# Patient Record
Sex: Female | Born: 1965 | Race: White | Hispanic: No | Smoking: Current every day smoker
Health system: Southern US, Community
[De-identification: ages and names within clinical notes are randomized; demographics above are authoritative.]

## PROBLEM LIST (undated history)

## (undated) DIAGNOSIS — J45909 Unspecified asthma, uncomplicated: Secondary | ICD-10-CM

## (undated) DIAGNOSIS — M797 Fibromyalgia: Secondary | ICD-10-CM

## (undated) DIAGNOSIS — F32A Depression, unspecified: Secondary | ICD-10-CM

## (undated) DIAGNOSIS — J4489 Other specified chronic obstructive pulmonary disease: Secondary | ICD-10-CM

## (undated) DIAGNOSIS — F419 Anxiety disorder, unspecified: Secondary | ICD-10-CM

## (undated) DIAGNOSIS — Q211 Atrial septal defect: Secondary | ICD-10-CM

## (undated) DIAGNOSIS — F17209 Nicotine dependence, unspecified, with unspecified nicotine-induced disorders: Secondary | ICD-10-CM

## (undated) DIAGNOSIS — J449 Chronic obstructive pulmonary disease, unspecified: Secondary | ICD-10-CM

## (undated) DIAGNOSIS — K219 Gastro-esophageal reflux disease without esophagitis: Secondary | ICD-10-CM

## (undated) DIAGNOSIS — F909 Attention-deficit hyperactivity disorder, unspecified type: Secondary | ICD-10-CM

## (undated) DIAGNOSIS — F329 Major depressive disorder, single episode, unspecified: Secondary | ICD-10-CM

## (undated) DIAGNOSIS — I639 Cerebral infarction, unspecified: Secondary | ICD-10-CM

## (undated) DIAGNOSIS — I2510 Atherosclerotic heart disease of native coronary artery without angina pectoris: Principal | ICD-10-CM

## (undated) HISTORY — DX: Anxiety disorder, unspecified: F41.9

## (undated) HISTORY — PX: APPENDECTOMY: SHX54

## (undated) HISTORY — PX: COSMETIC SURGERY: SHX468

## (undated) HISTORY — DX: Atrial septal defect: Q21.1

## (undated) HISTORY — DX: Nicotine dependence, unspecified, with unspecified nicotine-induced disorders: F17.209

## (undated) HISTORY — DX: Major depressive disorder, single episode, unspecified: F32.9

## (undated) HISTORY — DX: Atherosclerotic heart disease of native coronary artery without angina pectoris: I25.10

## (undated) HISTORY — DX: Depression, unspecified: F32.A

## (undated) HISTORY — PX: TONSILLECTOMY: SUR1361

## (undated) HISTORY — PX: ABDOMINAL HYSTERECTOMY: SHX81

## (undated) HISTORY — DX: Fibromyalgia: M79.7

## (undated) HISTORY — DX: Unspecified asthma, uncomplicated: J45.909

## (undated) HISTORY — DX: Attention-deficit hyperactivity disorder, unspecified type: F90.9

## (undated) HISTORY — PX: BRACHIOPLASTY: SUR162

---

## 2001-05-21 HISTORY — PX: GASTRIC BYPASS: SHX52

## 2006-12-02 DIAGNOSIS — I639 Cerebral infarction, unspecified: Secondary | ICD-10-CM

## 2006-12-02 HISTORY — DX: Cerebral infarction, unspecified: I63.9

## 2006-12-02 HISTORY — PX: CAROTID ENDARTERECTOMY: SUR193

## 2008-08-20 ENCOUNTER — Emergency Department (HOSPITAL_COMMUNITY): Admission: EM | Admit: 2008-08-20 | Discharge: 2008-08-20 | Payer: Self-pay | Admitting: Emergency Medicine

## 2008-09-17 ENCOUNTER — Ambulatory Visit: Payer: Self-pay | Admitting: Diagnostic Radiology

## 2008-09-17 ENCOUNTER — Emergency Department (HOSPITAL_BASED_OUTPATIENT_CLINIC_OR_DEPARTMENT_OTHER): Admission: EM | Admit: 2008-09-17 | Discharge: 2008-09-17 | Payer: Self-pay | Admitting: Emergency Medicine

## 2008-09-21 ENCOUNTER — Other Ambulatory Visit: Payer: Self-pay | Admitting: Emergency Medicine

## 2008-09-21 ENCOUNTER — Inpatient Hospital Stay (HOSPITAL_COMMUNITY): Admission: RE | Admit: 2008-09-21 | Discharge: 2008-09-26 | Payer: Self-pay | Admitting: Psychiatry

## 2008-09-21 ENCOUNTER — Ambulatory Visit: Payer: Self-pay | Admitting: Psychiatry

## 2008-11-04 ENCOUNTER — Emergency Department (HOSPITAL_COMMUNITY): Admission: EM | Admit: 2008-11-04 | Discharge: 2008-11-04 | Payer: Self-pay | Admitting: Emergency Medicine

## 2010-04-05 LAB — CBC
Hemoglobin: 11.3 g/dL — ABNORMAL LOW (ref 12.0–15.0)
MCHC: 33.5 g/dL (ref 30.0–36.0)
MCV: 85.9 fL (ref 78.0–100.0)
RDW: 15 % (ref 11.5–15.5)

## 2010-04-05 LAB — DIFFERENTIAL
Basophils Absolute: 0.1 10*3/uL (ref 0.0–0.1)
Basophils Relative: 1 % (ref 0–1)
Eosinophils Absolute: 0.2 10*3/uL (ref 0.0–0.7)
Monocytes Absolute: 0.5 10*3/uL (ref 0.1–1.0)
Monocytes Relative: 9 % (ref 3–12)
Neutro Abs: 2.4 10*3/uL (ref 1.7–7.7)

## 2010-04-05 LAB — BASIC METABOLIC PANEL
CO2: 25 mEq/L (ref 19–32)
Calcium: 9.1 mg/dL (ref 8.4–10.5)
Chloride: 105 mEq/L (ref 96–112)
Creatinine, Ser: 0.67 mg/dL (ref 0.4–1.2)
Glucose, Bld: 87 mg/dL (ref 70–99)
Sodium: 137 mEq/L (ref 135–145)

## 2010-04-05 LAB — POCT CARDIAC MARKERS: CKMB, poc: 3.6 ng/mL (ref 1.0–8.0)

## 2010-04-07 LAB — COMPREHENSIVE METABOLIC PANEL
ALT: 114 U/L — ABNORMAL HIGH (ref 0–35)
AST: 71 U/L — ABNORMAL HIGH (ref 0–37)
CO2: 27 mEq/L (ref 19–32)
Chloride: 104 mEq/L (ref 96–112)
Creatinine, Ser: 0.6 mg/dL (ref 0.4–1.2)
GFR calc Af Amer: >60 mL/min (ref >60)
GFR calc non Af Amer: >60 mL/min (ref >60)
Glucose, Bld: 111 mg/dL — ABNORMAL HIGH (ref 70–99)
Sodium: 141 mEq/L (ref 135–145)
Total Bilirubin: 0.1 mg/dL — ABNORMAL LOW (ref 0.3–1.2)

## 2010-04-07 LAB — CBC
HCT: 35.9 % — ABNORMAL LOW (ref 36.0–46.0)
Hemoglobin: 11.6 g/dL — ABNORMAL LOW (ref 12.0–15.0)
Hemoglobin: 11.9 g/dL — ABNORMAL LOW (ref 12.0–15.0)
MCHC: 33.1 g/dL (ref 30.0–36.0)
MCHC: 33.9 g/dL (ref 30.0–36.0)
MCV: 87.4 fL (ref 78.0–100.0)
MCV: 87.8 fL (ref 78.0–100.0)
RBC: 3.9 MIL/uL (ref 3.87–5.11)
RBC: 4.09 MIL/uL (ref 3.87–5.11)
RDW: 15.4 % (ref 11.5–15.5)
WBC: 6.2 10*3/uL (ref 4.0–10.5)

## 2010-04-07 LAB — DIFFERENTIAL
Basophils Absolute: 0 10*3/uL (ref 0.0–0.1)
Basophils Absolute: 0 10*3/uL (ref 0.0–0.1)
Basophils Relative: 1 % (ref 0–1)
Eosinophils Absolute: 0 10*3/uL (ref 0.0–0.7)
Eosinophils Absolute: 0.1 10*3/uL (ref 0.0–0.7)
Eosinophils Relative: 0 % (ref 0–5)
Eosinophils Relative: 3 % (ref 0–5)
Monocytes Absolute: 0.5 10*3/uL (ref 0.1–1.0)
Monocytes Relative: 11 % (ref 3–12)
Neutro Abs: 2 10*3/uL (ref 1.7–7.7)
Neutrophils Relative %: 76 % (ref 43–77)

## 2010-04-07 LAB — URINALYSIS, ROUTINE W REFLEX MICROSCOPIC
Bilirubin Urine: NEGATIVE
Hgb urine dipstick: NEGATIVE
Ketones, ur: NEGATIVE mg/dL
Nitrite: NEGATIVE
Protein, ur: NEGATIVE mg/dL
Urobilinogen, UA: 0.2 mg/dL (ref 0.0–1.0)

## 2010-04-07 LAB — LIPASE, BLOOD: Lipase: 2403 U/L — ABNORMAL HIGH (ref 23–300)

## 2010-04-07 LAB — BASIC METABOLIC PANEL
CO2: 27 mEq/L (ref 19–32)
Calcium: 8.8 mg/dL (ref 8.4–10.5)
Chloride: 105 mEq/L (ref 96–112)
GFR calc Af Amer: >60 mL/min (ref >60)
Glucose, Bld: 71 mg/dL (ref 70–99)
Potassium: 4.2 mEq/L (ref 3.5–5.1)
Sodium: 139 mEq/L (ref 135–145)

## 2010-04-07 LAB — POCT TOXICOLOGY PANEL

## 2010-04-07 LAB — POCT CARDIAC MARKERS: CKMB, poc: 1.3 ng/mL (ref 1.0–8.0)

## 2010-04-07 LAB — AMYLASE: Amylase: 88 U/L (ref 27–131)

## 2010-04-07 LAB — PREGNANCY, URINE: Preg Test, Ur: NEGATIVE

## 2010-04-08 LAB — COMPREHENSIVE METABOLIC PANEL
ALT: 30 U/L (ref 0–35)
Alkaline Phosphatase: 47 U/L (ref 39–117)
BUN: 8 mg/dL (ref 6–23)
CO2: 26 mEq/L (ref 19–32)
Chloride: 111 mEq/L (ref 96–112)
Glucose, Bld: 89 mg/dL (ref 70–99)
Potassium: 3.7 mEq/L (ref 3.5–5.1)
Sodium: 141 mEq/L (ref 135–145)
Total Bilirubin: 0.4 mg/dL (ref 0.3–1.2)
Total Protein: 6.5 g/dL (ref 6.0–8.3)

## 2010-04-08 LAB — CBC
Hemoglobin: 11.2 g/dL — ABNORMAL LOW (ref 12.0–15.0)
MCHC: 33.6 g/dL (ref 30.0–36.0)
Platelets: 245 10*3/uL (ref 150–400)
RDW: 14.9 % (ref 11.5–15.5)

## 2010-04-08 LAB — DIFFERENTIAL
Basophils Absolute: 0 10*3/uL (ref 0.0–0.1)
Basophils Relative: 0 % (ref 0–1)
Eosinophils Absolute: 0.1 10*3/uL (ref 0.0–0.7)
Monocytes Relative: 6 % (ref 3–12)
Neutro Abs: 4.4 10*3/uL (ref 1.7–7.7)
Neutrophils Relative %: 71 % (ref 43–77)

## 2010-04-08 LAB — APTT: aPTT: 27 seconds (ref 24–37)

## 2010-04-08 LAB — PROTIME-INR: INR: 1 (ref 0.00–1.49)

## 2010-04-08 LAB — CK TOTAL AND CKMB (NOT AT ARMC)
CK, MB: 2.2 ng/mL (ref 0.3–4.0)
Total CK: 219 U/L — ABNORMAL HIGH (ref 7–177)

## 2010-05-16 NOTE — Consult Note (Signed)
NAMECORALYN, ROSELLI                 ACCOUNT NO.:  [id number]   MEDICAL RECORD NO.:  [id number]          PATIENT TYPE:  EMS   LOCATION:  MAJO                         FACILITY:  [facility]   PHYSICIAN:  Pramod P. Pearlean Brownie, MD    DATE OF BIRTH:  1965-06-25   DATE OF CONSULTATION:  DATE OF DISCHARGE:  08/20/2008                                 CONSULTATION   REFERRING PHYSICIAN:  Annye Rusk, MD   REASON FOR REFERRAL:  Code stroke.   HISTORY OF PRESENT ILLNESS:  Ms. Cressler is a 45 year old Caucasian lady  who developed sudden onset of facial droop and right face and arm  numbness at about 2:30 this afternoon.  She was brought in to [facility]  Emergency Room where a Code Stroke was called.  She was evaluated by  stroke nurse and found to have variable exam with right-sided weakness  which is not consistent.  CT scan of the head was obtained on admission,  which is unremarkable.  The patient did complain of a previous history  of left brain stroke, and she had undergone carotid endarterectomy in  February 2009 in [place], [place].  Limited MRI scan was obtained as the  symptoms were not clear.  MRI scan did not show a definite stroke.  The  patient still complains of subjective facial asymmetry and some right-  sided paresthesias.  She states in February 2009, she developed some  facial asymmetry and right-sided numbness.  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  She states she physically did quite well and had  no lasting neurological deficits.  She was on aspirin which she has  stopped taking.   PAST MEDICAL HISTORY:  Significant for anxiety, depression, attention  deficit.  She does admit to significant recent stress in her life.   SOCIAL HISTORY:  She recently moved here from [place].  She lives in  [place].  She smokes regularly.  She  denies doing drugs and drinks  alcohol occasionally.   REVIEW OF SYSTEMS:  Negative for recent fever, cough, chest pain,  diarrhea, or illness.   HOME MEDICATIONS:  1. Adderall XR 30 mg twice a day.  2. Advair Diskus 250 mcg twice a day.  3. Amitiza 24 mg twice a day.  4. Valtrex 500 mg twice a day.  5. Xanax 1 mg as needed.  6. Cymbalta 60 mg once a day.   MEDICATIONS ALLERGIES:  CODEINE and MORPHINE.   PHYSICAL EXAMINATION:  GENERAL:  A young Caucasian lady who is currently  not in distress.  VITAL SIGNS:  She is presently afebrile with a pulse rate of 81 per  minute and regular, respiratory rate 21 per minute, blood pressure  118/70, oxygen saturation 97% on room air.  Distal pulses are well felt.  HEAD:  Nontraumatic.  NECK:  Supple.  There is no bruit.  ENT:  Unremarkable.  CARDIAC:  Regular heart sounds.  No murmur or  gallop.  LUNGS:  Clear to auscultation.  NEUROLOGICAL:  The patient is awake, alert.  She is oriented to time,  place, and person.  Speech and language appear normal.  She follows  commands well.  Eye movements are full range without nystagmus.  She  blinks to threat bilaterally.  She has a very poor effort in smiling,  and in fact, her left side of the face apparently moves less than the  right.  She has some throbbing in the right corner of the mouth.  She  makes a very poor effort to move her tongue which stays in her mouth  when asked to protrude it.  She has some subjective right-sided weakness  which is quite variable when she is distracted,  and testing the left  side, she is able to maintain tone of the right arm against gravity, but  when she is specifically asked to raise the right side off the bed, she  tries and is apparently unable to do so.  Deep tendon reflexes are 2+  symmetric.  Plantars are downgoing.  She has some subjective decreased  touch and pinprick sensation on the right side when she does prick the  midline.  Plantars are  downgoing.  Gait was not tested.   Data reviewed.  Noncontrast CAT scan of the head done today, reveals no  acute abnormality.  MRI scan of the brain shows no acute infarct on  diffusion weighted imaging.  In fact, the MRI looks normal with no  evidence of old stroke.  Flow voids of the large intracranial blood  vessels appear to be patent.   IMPRESSION AND PLAN:  A 45-year lady with sudden onset of some facial  asymmetry and subjective right-sided paresthesias and weakness with exam  which is highly variable and has nonorganic features.  I doubt this  represents a new stroke.  I would recommend admission for observation.  Check CT angiogram to evaluate for left carotid restenosis though I  doubt this.  A swallow eval by RN.  I had a long discussion with the  patient and mother regarding her symptoms.  She may need psych  evaluation and followup for her stress management.  Kindly call for  questions.  She may need to be started on baby aspirin in case her  previous history of stroke is real.           ______________________________  Sunny Schlein. Pearlean Brownie, MD     PPS/MEDQ  D:  08/20/2008  T:  08/21/2008  Job:  [id number]

## 2010-05-16 NOTE — Consult Note (Signed)
Diana Benson, Diana Benson                 ACCOUNT NO.:  [id number]   MEDICAL RECORD NO.:  [id number]          PATIENT TYPE:  EMS   LOCATION:  MAJO                         FACILITY:  [facility]   PHYSICIAN:  Diana Benson, M.D. DATE OF BIRTH:  11/20/1965   DATE OF CONSULTATION:  08/20/2008  DATE OF DISCHARGE:  08/20/2008                                 CONSULTATION   REASON FOR CONSULTATION:  Acute right-sided weakness.   PRIMARY CARE PHYSICIAN:  Unassigned/Dr. Ernestine Benson in [facility].  __________  1. Acute functional disorder.  2. History of anxiety and depression.  3. Clinical findings not supportive of an acute stroke.  4. Headache requiring oxycodone.  5. History of chronic medical problems as noted below.   RECOMMENDATIONS:  1. Give the patient a option to be discharged home tonight or to be      observed overnight.  2. Explained the process to the patient and family.  3. Oxycodone 5 mg every 4 hours p.r.n. total of 15 tablets dispensed.  4. The patient is to follow-up with her primary care physician for her      psychological problems.  5. The patient has elected to find her own primary care physician and      her own psychologist.   HISTORY OF PRESENT ILLNESS:  This is a 45 year old Caucasian lady who  reports that she used to live in [place] until she relocated to [place] 1 year ago.  She lived in [place] for a time and saw Dr. Ernestine Benson who has been renewing her prescriptions, but she has now moved  into [place] and does not have a primary care physician.  The patient  says she was at the swimming pool today and a few minutes after coming  out of the pool she noted that she was weak on the right side and numb  on the right side of her face.  The patient called family member after  about 30-40 minutes and EMS was called and she brought her to the  hospital.  The patient arrived at the hospital at 4:20 pm.  She was  evaluated by the code stroke team.  CT  scan of the brain was done which  showed no evidence of acute stroke.  She was subsequently evaluated by  neurologist Dr. Pearlean Benson who did not find any convincing evidence of a  stroke and the clinical exam was suggestive of a functional disorder.  The patient did have an MRI which showed no evidence of acute stroke  since the patient gave a history of left carotid endarterectomy in  February 2009 Dr. Pearlean Benson recommended evaluation of the carotid arteries.  The patient subsequently had a CT angiogram of the neck which showed no  evidence of obstruction.   The hospitalist service has been called to assist with management of  this patient.  The patient of findings that discussed with the patient  and the patient has elected to be discharged home and the history is  being taken to ensure it is safe to be discharged home.  The  patient  does have a history of anxiety and depression.  She does chronically  take Xanax episodically and Cymbalta for fibromyalgia.  She also takes  Adderall for attention deficit disorder.  She reports the past history  of an acute myocardial infarction at age 89 after she discovered her  husband was unfaithful with someone of the same sex and she has been  separated from her husband for 3 years now.  She reports that after her  acute myocardial infarction she was not required to have any type of  medical follow-up since it was a minor infarction.  She also reports  that she had a prior stroke in February 2009 and was treated at the  [facility].  At that time they discovered that she had an  atrial septal defect and they also found she had a stenosed left carotid  artery and she had carotid endarterectomy in February 2009.  Since that  time she has had no further neurological symptoms.   During this current period the patient denied any impairment of speech  or any difficulty swallowing and in fact, during the interview, the  patient continued to eat  vigorously and completed a full meal by the  time the interview was completed without any evidence of choking.  She  also was able to speak clearly without any slurring of speech and she  also was able to gesticulate with the right arm spontaneously without  any evidence of the right arm weakness.  The patient says she does not  usually suffer with headaches.   PAST MEDICAL HISTORY:  1. History of acute myocardial infarction due to severe marital stress      at age 80.  2. 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  3. History of peptic ulcer disease.  4. History of acute CVA as noted above in February 2009.  5. History of left carotid endarterectomy in February 2009.  6. History of irritable bowel syndrome.  7. History of adult attention deficit disorder.  8. History of fibromyalgia.  9. History of asthma.  10.Tobacco abuse.   MEDICATIONS:  1. Adderall XR 30 mg twice daily.  2. Amitiza 24 mg twice daily.  3. Valtrex 500 mg twice daily.  4. Cymbalta 60 mg daily.  5. Nexium 40 mg daily.  6. Advair Diskus 250/50 twice daily.  7. Xanax 1 mg p.r.n. says she usually uses about once per week.  8. Xopenex inhaler when necessary.  9. Xopenex via nebulizer when necessary.   ALLERGIES:  MORPHINE AND CODEINE causes nausea, vomiting and rash.  NSAIDS and any contact with ACETAMINOPHEN causes GI upset.   SOCIAL HISTORY:  Says she does not drink.  Used to be an occasional  drinker.  Does not use illicit drugs.  She smokes about one pack per  day.  She is an unemployed Electrical engineer.  She is separated  from her husband who was in the [employer] and so she has [employer].  She has a very supportive family.  Many of whom are in and  out during the interview but her parents are continuously present.   FAMILY HISTORY:  Is significant only for mother with hypertension.  Father who also had a hole in the heart, but otherwise among her  parents, her brother and her  children, there are no significant  cardiovascular or other medical problems.   REVIEW OF SYSTEMS:  On a 10-point review of systems the patient is now  complaining of a headache which she said is steady coming up from the  back of her neck over the whole top of her head and through to her eyes.  It is not throbbing.  It is aggravated by moving her neck to the left  and right.  She has no photophobia.  The patient also complains of the  past 2 weeks she has been having episodic difficulty noting things  sticking in her throat.  Other than this on a 10-point review of systems  was completely unremarkable.   PHYSICAL EXAM:  GENERAL:  Middle-aged Caucasian lady reclining in the  stretcher vigorously eating a meal, does not appear to be in any acute  distress.  Seems to be holding her face in a way to suggest a right-  sided facial droop and does not voluntarily use her right hand but it  sometimes gesticulates spontaneously in the course of a conversation.  There is no impairment of speech.  She is not drooling in any way.  VITALS:  Her respiration is 18.  Her pulse 72, blood pressure 100/67.  She is saturating at 99% on room air.  HEENT:  Her pupils are round, equal and reactive.  Mucous membranes pink  and anicteric.  She is not dehydrated.  No cervical lymphadenopathy.  No  thyromegaly.  No jugular venous distention.  She does have a scar over  her left sternomastoid suggestive of some type of surgery left side of  her neck.  CHEST:  Clear to auscultation bilaterally.  CARDIOVASCULAR:  Regular rhythm.  She has 2/6 systolic murmur at the  left lower sternal border.  ABDOMEN:  Scaphoid, soft and nontender.  She has supraumbilical scar  suggestive of past laparoscopic surgery.  EXTREMITIES:  Without edema.  She has 2+ dorsalis pedis pulses  bilaterally.  She does have a scar along the inner side of her left  thigh which she said status post plastic surgery after gastric bypass.  She has  no bony joint deformity.  CENTRAL NERVOUS SYSTEM:  Cranial nerves II-XII are grossly intact  although in an effort to demonstrate right-sided facial weakness.  She  actually is much stronger on the right side than the left.  In testing  her sternomastoid, right sternomastoid also significantly stronger on  the right than the left suggesting lack of effort.  When examining the  power in the right upper extremity.  The patient does not appear to be  using any effort and the arm she professes to be barely able to move it,  although it was noted to be moving vigorously much earlier.  Grip  strength is therefore down.  Power in the lower extremities grade 5  bilaterally.  Reflexes are brisk bilaterally in upper and lower  extremities.  Her skin is warm and dry.  There is no ulcerations.   LABORATORY DATA:  Stroke panel shows a white count of 6.7, hemoglobin  11.2, MCV 88.8, platelets 245.  She has a normal differential on white  count.  Her serum chemistry is completely normal with potassium of 3.7,  glucose of 89, BUN 8, creatinine 0.5.  Her liver function significant  for an albumin of 3.3, calcium of 8.2.  Total CK 219, CK-MB 2.2,  relative index 1.1.   IMAGING STUDIES:  CT scan of the brain shows no evidence of acute  infarct or hemorrhage.  The ventricles and subarachnoid spaces are  appropriately sized for age.  She has mild ethmoid sinus mucosal  thickening.  CT angiogram of the neck showed both common carotid,  carotid bulb and internal carotid arteries are widely patent, no plaque  formation or stenosis, vessels are patent along the entire visualized  coarse.  Vertebral arteries and basilar arteries are widely patent.  She  has a small left thyroid nodule.  The MRI by report shows no acute  abnormality.      Diana Benson, M.D.  Electronically Signed     LC/MEDQ  D:  08/20/2008  T:  08/21/2008  Job:  [id number]   cc:   Annye Rusk, MD  Dr. Pearlean Benson  Dr. Ernestine Benson, PCP,  Jonita Albee

## 2010-06-11 ENCOUNTER — Emergency Department (HOSPITAL_COMMUNITY)

## 2010-06-11 ENCOUNTER — Inpatient Hospital Stay (HOSPITAL_COMMUNITY)
Admission: EM | Admit: 2010-06-11 | Discharge: 2010-06-19 | DRG: 191 | Disposition: A | Discharge status: Home / Self Care | Attending: Internal Medicine | Admitting: Internal Medicine

## 2010-06-11 DIAGNOSIS — R1013 Epigastric pain: Secondary | ICD-10-CM | POA: Diagnosis present

## 2010-06-11 DIAGNOSIS — N390 Urinary tract infection, site not specified: Secondary | ICD-10-CM | POA: Diagnosis present

## 2010-06-11 DIAGNOSIS — Z9884 Bariatric surgery status: Secondary | ICD-10-CM

## 2010-06-11 DIAGNOSIS — R112 Nausea with vomiting, unspecified: Secondary | ICD-10-CM | POA: Diagnosis present

## 2010-06-11 DIAGNOSIS — Z8711 Personal history of peptic ulcer disease: Secondary | ICD-10-CM

## 2010-06-11 DIAGNOSIS — A498 Other bacterial infections of unspecified site: Secondary | ICD-10-CM | POA: Diagnosis present

## 2010-06-11 DIAGNOSIS — J441 Chronic obstructive pulmonary disease with (acute) exacerbation: Principal | ICD-10-CM | POA: Diagnosis present

## 2010-06-11 DIAGNOSIS — R0789 Other chest pain: Secondary | ICD-10-CM | POA: Diagnosis present

## 2010-06-11 DIAGNOSIS — D509 Iron deficiency anemia, unspecified: Secondary | ICD-10-CM | POA: Diagnosis present

## 2010-06-11 DIAGNOSIS — F341 Dysthymic disorder: Secondary | ICD-10-CM | POA: Diagnosis present

## 2010-06-11 LAB — URINE MICROSCOPIC-ADD ON

## 2010-06-11 LAB — LIPASE, BLOOD: Lipase: 15 U/L (ref 11–59)

## 2010-06-11 LAB — URINALYSIS, ROUTINE W REFLEX MICROSCOPIC
Nitrite: POSITIVE — AB
Protein, ur: NEGATIVE mg/dL
Urobilinogen, UA: 0.2 mg/dL (ref 0.0–1.0)

## 2010-06-11 LAB — DIFFERENTIAL
Lymphs Abs: 1.2 10*3/uL (ref 0.7–4.0)
Monocytes Absolute: 0.2 10*3/uL (ref 0.1–1.0)
Monocytes Relative: 4 % (ref 3–12)
Neutro Abs: 3.4 10*3/uL (ref 1.7–7.7)
Neutrophils Relative %: 69 % (ref 43–77)

## 2010-06-11 LAB — COMPREHENSIVE METABOLIC PANEL
BUN: 8 mg/dL (ref 6–23)
CO2: 29 mEq/L (ref 19–32)
Calcium: 8.8 mg/dL (ref 8.4–10.5)
Creatinine, Ser: 0.54 mg/dL (ref 0.4–1.2)
GFR calc Af Amer: >60 mL/min (ref >60)
GFR calc non Af Amer: >60 mL/min (ref >60)
Glucose, Bld: 92 mg/dL (ref 70–99)
Total Protein: 6.7 g/dL (ref 6.0–8.3)

## 2010-06-11 LAB — CBC
Hemoglobin: 10.2 g/dL — ABNORMAL LOW (ref 12.0–15.0)
MCH: 26 pg (ref 26.0–34.0)
MCHC: 31.8 g/dL (ref 30.0–36.0)
MCV: 81.9 fL (ref 78.0–100.0)
RBC: 3.92 MIL/uL (ref 3.87–5.11)

## 2010-06-12 LAB — RAPID URINE DRUG SCREEN, HOSP PERFORMED
Barbiturates: NOT DETECTED
Benzodiazepines: POSITIVE — AB
Cocaine: NOT DETECTED
Opiates: POSITIVE — AB
Tetrahydrocannabinol: NOT DETECTED

## 2010-06-12 LAB — DIFFERENTIAL
Basophils Relative: 0 % (ref 0–1)
Lymphocytes Relative: 18 % (ref 12–46)
Monocytes Relative: 8 % (ref 3–12)
Neutro Abs: 4.3 10*3/uL (ref 1.7–7.7)
Neutrophils Relative %: 74 % (ref 43–77)

## 2010-06-12 LAB — CBC
HCT: 32.5 % — ABNORMAL LOW (ref 36.0–46.0)
Hemoglobin: 9.9 g/dL — ABNORMAL LOW (ref 12.0–15.0)
MCH: 25.3 pg — ABNORMAL LOW (ref 26.0–34.0)
RBC: 3.91 MIL/uL (ref 3.87–5.11)

## 2010-06-12 LAB — HEMOGLOBIN A1C
Hgb A1c MFr Bld: 5.7 % — ABNORMAL HIGH (ref <5.7)
Mean Plasma Glucose: 117 mg/dL — ABNORMAL HIGH (ref <117)

## 2010-06-12 LAB — CARDIAC PANEL(CRET KIN+CKTOT+MB+TROPI)
CK, MB: 1.7 ng/mL (ref 0.3–4.0)
CK, MB: 1.7 ng/mL (ref 0.3–4.0)
Relative Index: INVALID (ref 0.0–2.5)
Relative Index: INVALID (ref 0.0–2.5)
Total CK: 82 U/L (ref 7–177)

## 2010-06-12 LAB — LIPID PANEL
Cholesterol: 124 mg/dL (ref 0–200)
HDL: 55 mg/dL (ref >39)
Triglycerides: 48 mg/dL (ref <150)

## 2010-06-12 LAB — COMPREHENSIVE METABOLIC PANEL
ALT: 21 U/L (ref 0–35)
AST: 15 U/L (ref 0–37)
CO2: 27 mEq/L (ref 19–32)
Calcium: 9.3 mg/dL (ref 8.4–10.5)
Sodium: 138 mEq/L (ref 135–145)
Total Protein: 7 g/dL (ref 6.0–8.3)

## 2010-06-13 LAB — CBC
HCT: 33.6 % — ABNORMAL LOW (ref 36.0–46.0)
MCHC: 30.4 g/dL (ref 30.0–36.0)
RDW: 16.3 % — ABNORMAL HIGH (ref 11.5–15.5)
WBC: 10.5 10*3/uL (ref 4.0–10.5)

## 2010-06-13 LAB — URINE CULTURE
Colony Count: >=100000
Culture  Setup Time: 201206102355

## 2010-06-13 LAB — FOLATE: Folate: 13.4 ng/mL

## 2010-06-13 LAB — IRON AND TIBC: Iron: 10 ug/dL — ABNORMAL LOW (ref 42–135)

## 2010-06-14 LAB — RETICULOCYTES: Retic Count, Absolute: 32.3 10*3/uL (ref 19.0–186.0)

## 2010-06-14 LAB — CBC
HCT: 30.5 % — ABNORMAL LOW (ref 36.0–46.0)
Hemoglobin: 9.5 g/dL — ABNORMAL LOW (ref 12.0–15.0)
MCH: 25.7 pg — ABNORMAL LOW (ref 26.0–34.0)
MCHC: 31.1 g/dL (ref 30.0–36.0)
MCV: 82.4 fL (ref 78.0–100.0)
RBC: 3.7 MIL/uL — ABNORMAL LOW (ref 3.87–5.11)

## 2010-06-16 LAB — CBC
HCT: 31.7 % — ABNORMAL LOW (ref 36.0–46.0)
Hemoglobin: 9.9 g/dL — ABNORMAL LOW (ref 12.0–15.0)
MCV: 81.7 fL (ref 78.0–100.0)
RBC: 3.88 MIL/uL (ref 3.87–5.11)
RDW: 16.5 % — ABNORMAL HIGH (ref 11.5–15.5)
WBC: 8.1 10*3/uL (ref 4.0–10.5)

## 2010-06-16 LAB — POCT OCCULT BLOOD STOOL (DEVICE): Fecal Occult Bld: NEGATIVE

## 2010-06-17 DIAGNOSIS — R1013 Epigastric pain: Secondary | ICD-10-CM

## 2010-06-18 DIAGNOSIS — R1013 Epigastric pain: Secondary | ICD-10-CM

## 2010-06-19 LAB — BASIC METABOLIC PANEL
CO2: 27 mEq/L (ref 19–32)
Chloride: 103 mEq/L (ref 96–112)
Creatinine, Ser: 0.54 mg/dL (ref 0.50–1.10)
Glucose, Bld: 73 mg/dL (ref 70–99)

## 2010-06-19 LAB — CBC
Hemoglobin: 11.3 g/dL — ABNORMAL LOW (ref 12.0–15.0)
MCV: 81 fL (ref 78.0–100.0)
Platelets: 331 10*3/uL (ref 150–400)
RBC: 4.47 MIL/uL (ref 3.87–5.11)
WBC: 9.8 10*3/uL (ref 4.0–10.5)

## 2010-06-19 LAB — DIFFERENTIAL
Eosinophils Absolute: 0.1 10*3/uL (ref 0.0–0.7)
Lymphocytes Relative: 37 % (ref 12–46)
Lymphs Abs: 3.6 10*3/uL (ref 0.7–4.0)
Monocytes Relative: 10 % (ref 3–12)
Neutro Abs: 5.1 10*3/uL (ref 1.7–7.7)
Neutrophils Relative %: 52 % (ref 43–77)

## 2010-06-25 NOTE — H&P (Signed)
NAMEIVIONA, HOLE                 ACCOUNT NO.:  [id number]  MEDICAL RECORD NO.:  [id number]  LOCATION:  WLED                         FACILITY[facility]  [facility]  PHYSICIAN:  Michiel Cowboy, MDDATE OF BIRTH:  Feb 18, 1965  DATE OF ADMISSION:  06/11/2010 DATE OF DISCHARGE:                             HISTORY & PHYSICAL   PRIMARY CARE PROVIDER:  Currently, she does not have anybody, but she did go to [facility] today.  CHIEF COMPLAINT:  Shortness of breath.  The patient is a 45 year old female with history of COPD, possible history of MI in the past, and chronic epigastric/chest pain who for the past 5 days has been having worsening cough productive of greenish sputum, worsening wheezing, low-grade fever.  Her chest pain which is chronic has been somewhat worse since April.  She cannot tell me any specific pattern which makes it worse or better, sometimes it lasts couple of minutes, sometimes it lasts all day long.  She has had some nausea and occasional vomiting for the past 1 week.  No diarrhea.  No constipation.  She is status post gastric bypass.  She denied any other significant complaints.  No neurological complaints. No urinary complaints.  Otherwise, review of systems is negative except for HPI, 10 systems reviewed.  PAST MEDICAL HISTORY:  Significant for, 1. Gastric bypass. 2. History of depression. 3. Two years ago she was admitted for heavy alcohol abuse, but     currently denies. 4. History of cerebrovascular accident in 2010. 5. COPD/asthma. 6. GERD.  SOCIAL HISTORY:  The patient is a current smoker, but feels like she can quit and have not smoked for the past 4 days.  She had in the past been binge drinking, but currently states she only drinks 2-3 drinks at a time on couple of days a week.  Denies withdrawal symptoms, last alcoholic drink was on Tuesday.  FAMILY HISTORY:  Noncontributory.  ALLERGIES: 1. CODEINE. 2. MORPHINE. 3. PENICILLIN causes  rash.  MEDICATIONS: 1. Concerta 36 mg twice a day. 2. Advair 250/50 mcg inhaled twice a day. 3. Nexium 40 mg a day. 4. Singulair 10 mg a day. 5. Wellbutrin 150 mg twice a day. 6. Xanax 1 mg, she can take it up to 4 times a day, but takes it     rarely on occasion. 7. Dilaudid 4 mg as needed for pain.  She also takes this fairly     rarely, this is a leftover from an operation that she had months     ago. 8. Albuterol 2 puffs as needed for shortness of breath.  PHYSICAL EXAMINATION:  VITAL SIGNS:  Temperature 99.4, blood pressure 112/63, pulse 82, respirations 20, saturating low 80s on room air, and 98% on 2 L. GENERAL:  The patient appears to be in no acute distress.  Speaks in complete sentences.  She is actually somewhat sleepy and states this is because of pain medication she received in the emergency department. HEAD:  Nontraumatic.  Moist mucous membranes. LUNGS:  There are definite wheezes throughout bilaterally.  There is a small inspiratory wheeze and a very end of inspiration which is not heard over the trachea, but there is  overall fairly good air movement despite this. HEART:  Regular rate and rhythm.  No murmurs appreciated. ABDOMEN:  Soft, nontender, nondistended.  Well-healed scars. LOWER EXTREMITIES:  Without clubbing, cyanosis, or edema.  There are well-healed scars on lower and upper extremities from plastic surgery to remove excess tissue. NEUROLOGIC:  Grossly intact strength 5/5 in 4 extremities.  LABORATORY DATA:  White blood cell count 5.0, hemoglobin 10.2.  Sodium 135, potassium 3.6, creatinine 0.54.  Total bilirubin 0.1, lipase 15. UA positive for nitrites, white blood cell count 0-2, no bacteria. Chest x-ray unremarkable.  KUB unremarkable.  EKG not available, we will obtain.  ASSESSMENT AND PLAN:  This is a 45 year old female with what seems to be likely chronic obstructive pulmonary disease exacerbation and chronic atypical chest pain which may be  worsening with questionable history of myocardial infarction in 2010. 1. Chronic obstructive pulmonary disease exacerbation.  We will admit.     We will start on prednisone.  She already received Solu-Medrol by     EMS.  We will given albuterol p.r.n. and Atrovent schedules.     Continue her Advair.  Since she is allergic to penicillin, we will     give Avelox and write for Mucinex. 2. History of chest pain which is chronic.  Etiology is not quite     clear since I cannot obtain any records about her history of     myocardial infarction.  We will cycle cardiac enzymes and obtain     EKG now anteriorly.  The chest pain is somewhat atypical and more     gastric in etiology.  She does have a history of gastroesophageal     reflux disease and gastric bypass. 3. Gastroesophageal reflux disease.  We will make sure she is on     Protonix. 4. Remote history of alcohol abuse.  She currently denies using heavy     alcohol, but nonetheless we will watch for     any signs of withdrawal.  Obtain alcohol level and U-tox screen. 5. Prophylaxis.  Protonix and Lovenox.  I spent a total of 1 hour on this admission. We have admitted the patient as she presented to [facility].     Michiel Cowboy, MD     AVD/MEDQ  D:  06/11/2010  T:  06/11/2010  Job:  [id number]  cc:   [facility]  Electronically Signed by Therisa Doyne MD on 06/25/2010 09:39:08 PM

## 2010-07-01 NOTE — Discharge Summary (Addendum)
Diana Benson, Diana Benson NO.:  [id number]  MEDICAL RECORD NO.:  [id number]  LOCATION:  1403                         FACILITY:  [facility]  PHYSICIAN:  Gwenyth Bender, NP      DATE OF BIRTH:  17-Jul-1965  DATE OF ADMISSION:  06/11/2010 DATE OF DISCHARGE:  06/19/2010                        DISCHARGE SUMMARY - REFERRING   PRIMARY CARE PROVIDER:  Currently she does not have one, but the patient has gone to the [facility] in [place] on June 10.  FINAL DISCHARGE DIAGNOSES: 1. Chronic obstructive pulmonary disease exacerbation. 2. Escherichia coli urinary tract infection. 3. Abdominal pain/nausea and the patient with history of peptic ulcer. 4. Iron-deficiency anemia. 5. History of depression/anxiety.  DISCHARGE MEDICATIONS: 1. Aspirin 81 mg p.o. daily. 2. Combivent 2 puffs inhaled b.i.d. 3. Docusate sodium 100 mg p.o. b.i.d. 4. Ferrous sulfate 325 mg p.o. b.i.d. 5. MiraLAX 17 g p.o. daily as needed for constipation. 6. Senna 2 tablets p.o. daily as needed for constipation. 7. Zovirax ointment 1 application topically b.i.d. as needed for cold     sore. 8. Advair Diskus 1 puff inhaled b.i.d. 9. Concerta 36 mg 1 tablet p.o. b.i.d. 10.Dilaudid 4 mg p.o. every 6 hours as needed for severe pain. 11.Nexium 40 mg p.o. daily. 12.Singulair 10 mg p.o. daily. 13.Tylenol Extra Strength 500 mg 2 tablets p.o. every 6 hours as     needed for pain. 14.Valtrex 500 mg p.o. b.i.d. 15.Wellbutrin 150 mg p.o. b.i.d. 16.Xanax 1 mg p.o. q.i.d. as needed for anxiety. 17.Xopenex 1 puff inhaled b.i.d. as needed for shortness of breath. 18.Xopenex nebulizer 1.25 mg 1 nebulization inhaled every 46 hours as     needed for shortness of breath.  CONSULTATIONS:  Gastroenterology, Dr. Melvia Heaps on June 16.  He recommended an upper endoscopy with Dr. Elnoria Howard.  Recommended gastroparesis diet and continuation of PPI therapy.  PROCEDURES:  EGD on June 18; results to be dictated once  known.  DIAGNOSTIC STUDIES: 1. Chest x-ray on admission, yields a negative chest. 2. Abdominal x-ray on admission showed no acute findings.  DIAGNOSTIC LABS:  Yield a WBC 5.0, hemoglobin 10.2, hematocrit 32.1, platelets 229.  Sodium 135, potassium 3.6, chloride 98, CO2 29, BUN 8, creatinine 0.54.  Total bili 0.2.  Lipase 15.  Urinalysis with trace leukocytes, positive nitrites, trace blood, 15 ketones, 0-2 WBCs.  EtOH level less than 11.  Total CK 95, CK-MB 1.7, troponin-I less than 0.30. Second set yields total CK 82, CK-MB 1.7, troponin-I less than 0.30. Magnesium level 2.2.  Phosphorus 3.7.  Hemoglobin A1c 5.7.  Lipid profile was unremarkable.  Urine drug screen was positive for opioids on June 11.  Anemia panel with iron of 10, TIBC not calculated as UIBC is greater than 450.  Urine culture on June 10 yields greater than 100,000 colonies E coli.  FOBT was negative.  Reticulocyte 0.9, RBCs 3.59, absolute reticulocytes 32.3.  CODE STATUS:  Full code.  ALLERGIES:  CODEINE, MORPHINE, and PENICILLIN.  CHIEF COMPLAINT:  Shortness of breath.  BRIEF HISTORY:  Ms. Struthers is a 45 year old female with a history of COPD, questionable MI, status post gastric bypass surgery for obesity, chronic complaints of  epigastric pain, who presented to [facility] with a chief complaint of shortness of breath.  She reported she had been on antibiotics and PPI therapy, but her coughing had gotten worse.  She also reported she has a history of a bleeding gastric ulcer diagnosed 2 years ago.  She indicated that over the past 3 weeks she has been experiencing mild-to-moderate upper mid epigastric pain.  She indicates the pain worsened while she was being worked up for her shortness of breath.  She denied any nausea or any change in her pain around eating. She does indicate that when she was at the [facility] on the day of admission due to worsening shortness of breath, she had a pulse oxygenation  saturation level of 88%.  She was admitted by the [facility] for further evaluation and treatment.  HOSPITAL COURSE: 1. COPD exacerbation:  The patient was admitted to telemetry floor and     given prednisone 60 mg p.o.  She was also provided with DuoNebs and     started on Avelox.  She was provided O2 supplement and was able to     maintain her saturations in the 90s with 2 L via nasal cannula.     She continued with a nonproductive cough for which she was provided     Mucinex and Tessalon Perles.  Her wheezes and rhonchi worsened on     the second day so she was provided with Solu-Medrol IV.  This was     continued until June 14 and it was switched back to prednisone.     She gradually was able to be weaned off of her oxygen and at the     time of discharge is maintaining her saturations between 94 and 97     on room air.  She experiences no shortness of breath with     ambulation and her cough is much improved. 2. E coli UTI:  The patient had been started on Avelox at the time of     admission.  Once E coli was noted in her urine, Avelox was stopped     and Levaquin was started.  She has completed a course of Levaquin. 3. Abdominal pain/nausea in a patient with history of peptic ulcer     disease as well as gastric bypass surgery.  The patient continued     to experience intermittent bilateral upper quadrant abdominal pain,     initially thought to be associated with her vigorous coughing.  She     was able to tolerate a diet and experienced no emesis.  Zofran was     managing any nausea she did experience.  She was started on a half-     strength Dilaudid PCA for pain management on June 13.  She got fair     relief from that but continued to experience the abdominal pain in     spite of the Dilaudid and Protonix.  On June 16, her symptoms     changed, specifically her pain became more constant, sharp in     nature.  Given her history of peptic ulcer disease and gastric      bypass surgery, GI was called to consult.  Dr. Arlyce Dice on June 16     evaluated the patient and recommended an EGD.  He did indicate that     he suspected that the chronic abdominal pain was due to     musculoskeletal and that the nausea may be multifactorial,  including gastroparesis.  He recommended EGD to rule out partial     gastric outlet obstruction.  Results of that procedure are pending     at the time of this dictation. 4. Iron-deficiency anemia, likely has absorption deficit secondary to     gastric surgery.  The patient did receive IV iron as well as p.o.     supplements.  At the time of discharge, her hemoglobin was 11.3 and     hematocrit was 36.2. 5. History of depression/anxiety:  The patient remained stable on her     home medications.  They include Xanax on a p.r.n. basis and     methylphenidate, and Wellbutrin.  PHYSICAL EXAM:  VITAL SIGNS:  Temperature 97.5, blood pressure 94/63, heart rate 76, respirations 16, saturations 96% on room air. GENERAL:  Awake, alert, well-nourished, well-hydrated, in no acute distress. CARDIOVASCULAR:  Regular rate and rhythm.  No murmur, gallop or rub.  No lower extremity edema. RESPIRATORY:  No increased work of breathing.  Breath sounds clear with the exception of some mild expiratory wheezing on the right mid to lower lobe. ABDOMEN:  Round, soft, positive bowel sounds throughout, nontender to palpation. NEURO:  Alert and oriented.  Speech clear.  Facial symmetry.  DISCHARGE INSTRUCTIONS: 1. Diet:  No restrictions at the time of this dictation.  If GI has     recommendations post EGD, will dictate on an addendum. 2. Activity:  No restrictions at this time. 3. Followup:  She will need to see her primary care provider at [facility]     on an as-needed basis.  We will dictate GI's recommendations post     EGD when they are known.  Time spent on this discharge, 30 minutes.  Dictated For:  Dr. Marcelino Duster A. Ashley Royalty.     Gwenyth Bender, NP     KMB/MEDQ  D:  06/19/2010  T:  06/19/2010  Job:  [id number]  Electronically Signed by Toya Smothers  on 07/01/2010 10:59:28 AM Electronically Signed by Marthann Schiller MD on 07/20/2010 12:06:31 PM

## 2010-07-01 NOTE — Discharge Summary (Addendum)
  [name], Diana Benson                 ACCOUNT NO.:  [id number]  MEDICAL RECORD NO.:  [id number]  LOCATION:  1403                         FACILITY:  [facility]  PHYSICIAN:  Altha Harm, MDDATE OF BIRTH:  1965/07/05  DATE OF ADMISSION:  06/11/2010 DATE OF DISCHARGE:  06/19/2010                        DISCHARGE SUMMARY - REFERRING   ADDENDUM TO DISCHARGE SUMMARY:  PROCEDURES:  The patient had EGD on June 18th per Dr. Loreta Ave and results of EGD per Dr. Loreta Ave was the patient is status post Billroth II procedure.  Otherwise, normal EGD.  There were no ulcers, erosions, masses, or polyps noted.  RECOMMENDATIONS:  Per Dr. Loreta Ave, 1. Antireflux regimen to be followed. 2. Avoid NSAIDs for now. 3. Continue PPI every a.m. 4. Outpatient followup with primary care provider as needed.     Diana Bender, NP   ______________________________ Altha Harm, MD    KMB/MEDQ  D:  06/19/2010  T:  06/19/2010  Job:  [id number]  Electronically Signed by Toya Smothers  on 07/01/2010 10:59:35 AM Electronically Signed by Marthann Schiller MD on 07/20/2010 12:06:18 PM

## 2010-07-17 NOTE — Consult Note (Signed)
  NAMECHAUNTA, BEJARANO                 ACCOUNT NO.:  [id number]  MEDICAL RECORD NO.:  [id number]  LOCATION:  1403                         FACILITY:  [facility]  PHYSICIAN:  Barbette Hair. Arlyce Dice, MD,FACGDATE OF BIRTH:  Sep 04, 1965  DATE OF CONSULTATION: DATE OF DISCHARGE:                                CONSULTATION   Ms. Strickland is a 45 year old female with history of COPD, questionable MI, status post gastric bypass surgery for obesity, chronic complaints of epigastric pain, admitted on January 10, 2010 with exacerbation of her COPD with bronchitis.  She has been on antibiotics and PPI therapy.  She has had moderately severe coughing.  She also has a history of a bleeding gastric ulcer diagnosed 2 years ago. Over the past 3 weeks, she has been complaining of mild to moderate upper midepigastric pain.  Pain has worsened since her admission and is now chronic.  It is unaffected by eating.  She also complains of nausea.  MEDICATIONS:  Include Advair, Atrovent, Colace, Concerta, iron, Levaquin, Lovenox, MiraLax, Mucinex, Protonix 40 mg every 12 hours, Tessalon, Valtrex, Wellbutrin, Robitussin, Roxicodone, Senokot, Ultram, and Xanax.  ALLERGIES:  She is allergic to CODEINE, MORPHINE, and PENICILLIN.  PAST MEDICAL HISTORY:  As stated above.  She also has a history of depression and heavy alcohol use.  She is status post CVA in 2010.  She smokes.  She admits to drinking 2-3 drinks at a time several days a week.  FAMILY HISTORY:  Noncontributory.  REVIEW OF SYSTEMS:  Negative except for symptoms described under HPI.  PHYSICAL EXAMINATION:  GENERAL:  She is a well-developed, well-nourished female. HEENT:  Within normal limits.  There is no lymphadenopathy. CHEST:  There are few inspiratory and expiratory rhonchi with no cardiac murmurs, gallops or rubs. ABDOMEN:  There is tenderness to palpation in the midepigastrium in both left and right upper quadrants without guarding or rebound.  There  are no abdominal masses or organomegaly. EXTREMITIES:  There is no cyanosis, clubbing or edema. NEUROLOGIC:  Nonfocal.  She is alert and oriented x3.  IMPRESSION: 1. Chronic abdominal pain, suspect this is due to musculoskeletal     pain.  I cannot rule out recurrent gastric ulceration. 2. Nausea.  This may be multifactorial including gastroparesis.  A     partial gastric outlet obstruction should be ruled out.  RECOMMENDATIONS: 1. Upper endoscopy on June 19, 2010 with Dr. Jeani Hawking. 2. Gastroparesis diet. 3. Continue PPI therapy.     Barbette Hair. Arlyce Dice, MD,FACG     RDK/MEDQ  D:  06/17/2010  T:  06/17/2010  Job:  [id number]  Electronically Signed by Melvia Heaps MDFACG on 07/17/2010 08:34:58 AM

## 2010-07-24 ENCOUNTER — Emergency Department (HOSPITAL_COMMUNITY): Payer: No Typology Code available for payment source

## 2010-07-24 ENCOUNTER — Emergency Department (HOSPITAL_COMMUNITY)
Admission: EM | Admit: 2010-07-24 | Discharge: 2010-07-24 | Disposition: A | Discharge status: Home / Self Care | Payer: No Typology Code available for payment source | Attending: Family Medicine | Admitting: Family Medicine

## 2010-07-24 ENCOUNTER — Encounter (HOSPITAL_COMMUNITY): Payer: Self-pay | Admitting: Radiology

## 2010-07-24 DIAGNOSIS — R404 Transient alteration of awareness: Secondary | ICD-10-CM | POA: Insufficient documentation

## 2010-07-24 DIAGNOSIS — K219 Gastro-esophageal reflux disease without esophagitis: Secondary | ICD-10-CM | POA: Insufficient documentation

## 2010-07-24 DIAGNOSIS — S0990XA Unspecified injury of head, initial encounter: Secondary | ICD-10-CM | POA: Insufficient documentation

## 2010-07-24 DIAGNOSIS — R51 Headache: Secondary | ICD-10-CM | POA: Insufficient documentation

## 2010-07-24 DIAGNOSIS — Z8679 Personal history of other diseases of the circulatory system: Secondary | ICD-10-CM | POA: Insufficient documentation

## 2010-07-24 DIAGNOSIS — J4489 Other specified chronic obstructive pulmonary disease: Secondary | ICD-10-CM | POA: Insufficient documentation

## 2010-07-24 DIAGNOSIS — J449 Chronic obstructive pulmonary disease, unspecified: Secondary | ICD-10-CM | POA: Insufficient documentation

## 2010-07-24 DIAGNOSIS — M549 Dorsalgia, unspecified: Secondary | ICD-10-CM | POA: Insufficient documentation

## 2010-07-24 HISTORY — DX: Gastro-esophageal reflux disease without esophagitis: K21.9

## 2010-07-24 HISTORY — DX: Other specified chronic obstructive pulmonary disease: J44.89

## 2010-07-24 HISTORY — DX: Cerebral infarction, unspecified: I63.9

## 2010-07-24 HISTORY — DX: Chronic obstructive pulmonary disease, unspecified: J44.9

## 2010-07-24 LAB — URINALYSIS, ROUTINE W REFLEX MICROSCOPIC
Bilirubin Urine: NEGATIVE
Glucose, UA: NEGATIVE mg/dL
Leukocytes, UA: NEGATIVE
Nitrite: NEGATIVE
Specific Gravity, Urine: 1.016 (ref 1.005–1.030)
pH: 6 (ref 5.0–8.0)

## 2010-07-24 LAB — POCT PREGNANCY, URINE: Preg Test, Ur: NEGATIVE

## 2010-08-25 ENCOUNTER — Ambulatory Visit: Admitting: Obstetrics and Gynecology

## 2010-12-28 ENCOUNTER — Ambulatory Visit (INDEPENDENT_AMBULATORY_CARE_PROVIDER_SITE_OTHER): Payer: BC Managed Care – PPO

## 2010-12-28 DIAGNOSIS — J45909 Unspecified asthma, uncomplicated: Secondary | ICD-10-CM

## 2010-12-28 DIAGNOSIS — R05 Cough: Secondary | ICD-10-CM

## 2011-01-11 ENCOUNTER — Ambulatory Visit (INDEPENDENT_AMBULATORY_CARE_PROVIDER_SITE_OTHER): Payer: BC Managed Care – PPO | Admitting: Physician Assistant

## 2011-01-11 DIAGNOSIS — J45909 Unspecified asthma, uncomplicated: Secondary | ICD-10-CM

## 2011-01-11 DIAGNOSIS — F909 Attention-deficit hyperactivity disorder, unspecified type: Secondary | ICD-10-CM

## 2011-01-11 DIAGNOSIS — N760 Acute vaginitis: Secondary | ICD-10-CM

## 2011-01-11 DIAGNOSIS — Z23 Encounter for immunization: Secondary | ICD-10-CM

## 2011-01-11 DIAGNOSIS — F341 Dysthymic disorder: Secondary | ICD-10-CM

## 2011-01-19 ENCOUNTER — Encounter: Payer: Self-pay | Admitting: Physician Assistant

## 2011-01-19 ENCOUNTER — Encounter: Payer: Self-pay | Admitting: Family Medicine

## 2011-01-19 DIAGNOSIS — F419 Anxiety disorder, unspecified: Secondary | ICD-10-CM | POA: Insufficient documentation

## 2011-01-19 DIAGNOSIS — F909 Attention-deficit hyperactivity disorder, unspecified type: Secondary | ICD-10-CM

## 2011-01-19 DIAGNOSIS — I639 Cerebral infarction, unspecified: Secondary | ICD-10-CM

## 2011-01-19 DIAGNOSIS — J45909 Unspecified asthma, uncomplicated: Secondary | ICD-10-CM | POA: Insufficient documentation

## 2011-01-19 DIAGNOSIS — F32A Depression, unspecified: Secondary | ICD-10-CM

## 2011-01-19 DIAGNOSIS — F329 Major depressive disorder, single episode, unspecified: Secondary | ICD-10-CM

## 2011-01-19 DIAGNOSIS — M797 Fibromyalgia: Secondary | ICD-10-CM | POA: Insufficient documentation

## 2011-02-16 ENCOUNTER — Other Ambulatory Visit: Payer: Self-pay | Admitting: Physician Assistant

## 2011-02-16 MED ORDER — ALPRAZOLAM 1 MG PO TABS: 1.0000 mg | ORAL_TABLET | Freq: Three times a day (TID) | ORAL | Status: DC | PRN

## 2011-04-17 ENCOUNTER — Other Ambulatory Visit: Payer: Self-pay

## 2011-04-17 MED ORDER — ALPRAZOLAM 1 MG PO TABS: 1.0000 mg | ORAL_TABLET | Freq: Three times a day (TID) | ORAL | Status: DC | PRN

## 2011-04-19 ENCOUNTER — Encounter: Payer: Self-pay | Admitting: Physician Assistant

## 2011-04-19 ENCOUNTER — Ambulatory Visit (INDEPENDENT_AMBULATORY_CARE_PROVIDER_SITE_OTHER): Payer: BC Managed Care – PPO | Admitting: Physician Assistant

## 2011-04-19 VITALS — BP 119/79 | HR 79 | Temp 98.3°F | Resp 16 | Ht 62.0 in | Wt 158.4 lb

## 2011-04-19 DIAGNOSIS — G4700 Insomnia, unspecified: Secondary | ICD-10-CM

## 2011-04-19 DIAGNOSIS — F341 Dysthymic disorder: Secondary | ICD-10-CM

## 2011-04-19 DIAGNOSIS — M797 Fibromyalgia: Secondary | ICD-10-CM

## 2011-04-19 DIAGNOSIS — F419 Anxiety disorder, unspecified: Secondary | ICD-10-CM

## 2011-04-19 DIAGNOSIS — IMO0001 Reserved for inherently not codable concepts without codable children: Secondary | ICD-10-CM

## 2011-04-19 DIAGNOSIS — J45909 Unspecified asthma, uncomplicated: Secondary | ICD-10-CM

## 2011-04-19 DIAGNOSIS — F909 Attention-deficit hyperactivity disorder, unspecified type: Secondary | ICD-10-CM

## 2011-04-19 MED ORDER — ZOLPIDEM TARTRATE 10 MG PO TABS: 10.0000 mg | ORAL_TABLET | Freq: Every evening | ORAL | Status: DC | PRN

## 2011-04-19 MED ORDER — ALPRAZOLAM 1 MG PO TABS: 1.0000 mg | ORAL_TABLET | Freq: Three times a day (TID) | ORAL | Status: DC | PRN

## 2011-04-19 MED ORDER — AMPHETAMINE-DEXTROAMPHET ER 30 MG PO CP24: 30.0000 mg | ORAL_CAPSULE | ORAL | Status: DC

## 2011-04-19 MED ORDER — DULOXETINE HCL 60 MG PO CPEP: 60.0000 mg | ORAL_CAPSULE | Freq: Two times a day (BID) | ORAL | Status: DC

## 2011-04-19 MED ORDER — ALPRAZOLAM 1 MG PO TABS: 1.0000 mg | ORAL_TABLET | Freq: Three times a day (TID) | ORAL | Status: AC | PRN

## 2011-04-19 MED ORDER — MONTELUKAST SODIUM 10 MG PO TABS: 10.0000 mg | ORAL_TABLET | ORAL | Status: DC

## 2011-04-19 NOTE — Progress Notes (Signed)
  Subjective:    Patient ID: Diana Benson, female    DOB: Feb 18, 1965, 46 y.o.   MRN: [id number]  HPI Presents for re-evaluation of ADHD, Anxiety with depression and fibromyalgia since restarting medications previously used with success.  Is doing much better regarding sleep, attention/focus, and mood.  Feels in control of her life, despite the chaos around her.  Daughter recently arrested for selling drugs to [employer], [employer] (felony charge) as well as prostitution charge.  Also just found out that daughter is pregnant.   Review of Systems No chest pain, SOB, HA, dizziness, vision change, N/V, diarrhea, dysuria, or rash.     Objective:   Physical Exam Vital signs noted. Well-developed, well nourished WF who is awake, alert and oriented, in NAD. HEENT: [place]/AT, sclera and conjunctiva are clear.   Neck: supple, non-tender, no lymphadenopathy, thyromegaly. Heart: RRR, no murmur Lungs: CTA Extremities: no cyanosis, clubbing or edema. Skin: warm and dry without rash.        Assessment & Plan:   1. ADHD (attention deficit hyperactivity disorder)  amphetamine-dextroamphetamine (ADDERALL XR, 30MG ,) 30 MG 24 hr capsule, amphetamine-dextroamphetamine (ADDERALL XR, 30MG ,) 30 MG 24 hr capsule (for 05/19/2011), amphetamine-dextroamphetamine (ADDERALL XR, 30MG ,) 30 MG 24 hr capsule (for 06/18/2011)  2. Anxiety and depression  ALPRAZolam (XANAX) 1 MG tablet, DULoxetine (CYMBALTA) 60 MG capsule, ALPRAZolam (XANAX) 1 MG tablet (for 05/19/2011), ALPRAZolam (XANAX) 1 MG tablet (for 06/18/2011)  3. Asthma  montelukast (SINGULAIR) 10 MG tablet  4. Fibromyalgia  DULoxetine (CYMBALTA) 60 MG capsule  5. Insomnia  zolpidem (AMBIEN) 10 MG tablet, zolpidem (AMBIEN) 10 MG tablet (for 05/19/2011), zolpidem (AMBIEN) 10 MG tablet (for 06/18/2011)  May call for Rx's for July, August, September.  Re-evaluate 10/2011.

## 2011-04-19 NOTE — Patient Instructions (Signed)
You may call in 3 months for the next three months of the Adderall XR, Ambien and Xanax.

## 2011-05-06 ENCOUNTER — Other Ambulatory Visit: Payer: Self-pay | Admitting: Family Medicine

## 2011-06-17 ENCOUNTER — Emergency Department (HOSPITAL_COMMUNITY): Payer: BC Managed Care – PPO

## 2011-06-17 ENCOUNTER — Emergency Department (HOSPITAL_COMMUNITY)
Admission: EM | Admit: 2011-06-17 | Discharge: 2011-06-17 | Disposition: A | Discharge status: Home / Self Care | Payer: BC Managed Care – PPO | Attending: Emergency Medicine | Admitting: Emergency Medicine

## 2011-06-17 ENCOUNTER — Encounter (HOSPITAL_COMMUNITY): Payer: Self-pay | Admitting: Emergency Medicine

## 2011-06-17 DIAGNOSIS — J4489 Other specified chronic obstructive pulmonary disease: Secondary | ICD-10-CM | POA: Insufficient documentation

## 2011-06-17 DIAGNOSIS — K219 Gastro-esophageal reflux disease without esophagitis: Secondary | ICD-10-CM | POA: Insufficient documentation

## 2011-06-17 DIAGNOSIS — J45901 Unspecified asthma with (acute) exacerbation: Secondary | ICD-10-CM

## 2011-06-17 DIAGNOSIS — J449 Chronic obstructive pulmonary disease, unspecified: Secondary | ICD-10-CM | POA: Insufficient documentation

## 2011-06-17 DIAGNOSIS — Z79899 Other long term (current) drug therapy: Secondary | ICD-10-CM | POA: Insufficient documentation

## 2011-06-17 MED ORDER — PREDNISONE 20 MG PO TABS
60.0000 mg | ORAL_TABLET | Freq: Once | ORAL | Status: AC
  Administered 2011-06-17: 60 mg via ORAL
  Filled 2011-06-17: qty 3

## 2011-06-17 MED ORDER — ALBUTEROL SULFATE (5 MG/ML) 0.5% IN NEBU
5.0000 mg | INHALATION_SOLUTION | Freq: Once | RESPIRATORY_TRACT | Status: AC
  Administered 2011-06-17: 5 mg via RESPIRATORY_TRACT
  Filled 2011-06-17: qty 1

## 2011-06-17 MED ORDER — IPRATROPIUM BROMIDE 0.02 % IN SOLN
0.5000 mg | Freq: Once | RESPIRATORY_TRACT | Status: AC
  Administered 2011-06-17: 0.5 mg via RESPIRATORY_TRACT
  Filled 2011-06-17: qty 2.5

## 2011-06-17 MED ORDER — PREDNISONE 20 MG PO TABS: 60.0000 mg | ORAL_TABLET | Freq: Every day | ORAL | Status: AC

## 2011-06-17 NOTE — ED Provider Notes (Addendum)
History     CSN: [id number]  Arrival date & time 06/17/11  0243   First MD Initiated Contact with Patient 06/17/11 [phone]      Chief Complaint  Patient presents with  . Asthma    (Consider location/radiation/quality/duration/timing/severity/associated sxs/prior treatment) HPI History provided by patient. Has history of asthma. Has inhaler at home. Exposed to cats which she believes is her trigger. Using inhaler without relief. No fevers. Has been coughing. No productive sputum. No sick contacts in the household. Has never required admission for asthma. Moderate in severity. No chest pain. No rashes. no leg pain or swelling. Past Medical History  Diagnosis Date  . COPD (chronic obstructive pulmonary disease) with chronic bronchitis   . CVA (cerebral vascular accident)   . GERD (gastroesophageal reflux disease)   . S/P appy   . History of gastric bypass   . Hx of hysterectomy, total     History reviewed. No pertinent past surgical history.  No family history on file.  History  Substance Use Topics  . Smoking status: Former Smoker -- 1.0 packs/day for 17 years    Types: Cigarettes    Quit date: 06/09/2010  . Smokeless tobacco: Not on file  . Alcohol Use: Yes     rare    OB History    Grav Para Term Preterm Abortions TAB SAB Ect Mult Living                  Review of Systems  Constitutional: Negative for fever and chills.  HENT: Negative for neck pain and neck stiffness.   Eyes: Negative for pain.  Respiratory: Positive for shortness of breath and wheezing.   Cardiovascular: Negative for chest pain.  Gastrointestinal: Negative for abdominal pain.  Genitourinary: Negative for dysuria.  Musculoskeletal: Negative for back pain.  Skin: Negative for rash.  Neurological: Negative for headaches.  All other systems reviewed and are negative.    Allergies  Codeine and Morphine and related  Home Medications   Current Outpatient Rx  Name Route Sig Dispense Refill    . ALBUTEROL SULFATE (5 MG/ML) 0.5% IN NEBU Nebulization Take 2.5 mg by nebulization every 6 (six) hours as needed.    . ALPRAZOLAM 1 MG PO TABS Oral Take 1 tablet (1 mg total) by mouth 3 (three) times daily as needed. Patient is taking 1 every 6 hours prn. 90 tablet 0  . AMPHETAMINE-DEXTROAMPHET ER 30 MG PO CP24 Oral Take 30 mg by mouth every morning.    . DULOXETINE HCL 60 MG PO CPEP Oral Take 1 capsule (60 mg total) by mouth 2 (two) times daily. 180 capsule 1  . ESOMEPRAZOLE MAGNESIUM 40 MG PO CPDR Oral Take 40 mg by mouth 1 day or 1 dose.    Marland Kitchen EST ESTROGENS-METHYLTEST 0.625-1.25 MG PO TABS Oral Take 1 tablet by mouth daily.    Marland Kitchen FLUTICASONE-SALMETEROL 250-50 MCG/DOSE IN AEPB Inhalation Inhale 1 puff into the lungs every 12 (twelve) hours.    Marland Kitchen MONTELUKAST SODIUM 10 MG PO TABS Oral Take 1 tablet (10 mg total) by mouth 1 day or 1 dose. 90 tablet 1  . PROAIR HFA 108 (90 BASE) MCG/ACT IN AERS  INHALE 2 PUFFS EVERY 4 TO 6 HOURS AS NEEDED 1 Inhaler 2  . VALACYCLOVIR HCL 500 MG PO TABS Oral Take 500 mg by mouth daily.    Marland Kitchen ZOLPIDEM TARTRATE 10 MG PO TABS Oral Take 1-1.5 tablets (10-15 mg total) by mouth at bedtime as needed. 30 tablet 0  BP 109/67  Pulse 88  Temp 98.1 F (36.7 C) (Oral)  Resp 25  Ht 5\' 2"  (1.575 m)  Wt 155 lb (70.308 kg)  BMI 28.35 kg/m2  SpO2 94%  Physical Exam  Constitutional: She is oriented to person, place, and time. She appears well-developed and well-nourished.  HENT:  Head: Normocephalic and atraumatic.  Eyes: Conjunctivae and EOM are normal. Pupils are equal, round, and reactive to light.  Neck: Trachea normal. Neck supple. No thyromegaly present.  Cardiovascular: Normal rate, regular rhythm, S1 normal, S2 normal and normal pulses.     No systolic murmur is present   No diastolic murmur is present  Pulses:      Radial pulses are 2+ on the right side, and 2+ on the left side.  Pulmonary/Chest: She has no rhonchi. She has no rales. She exhibits no  tenderness.       Decreased bilateral breath sounds without respiratory distress. Prolonged expirations  Abdominal: Soft. Normal appearance and bowel sounds are normal. There is no tenderness. There is no CVA tenderness and negative Murphy's sign.  Musculoskeletal:       BLE:s Calves nontender, no cords or erythema, negative Homans sign  Neurological: She is alert and oriented to person, place, and time. She has normal strength. No cranial nerve deficit or sensory deficit. GCS eye subscore is 4. GCS verbal subscore is 5. GCS motor subscore is 6.  Skin: Skin is warm and dry. No rash noted. She is not diaphoretic.  Psychiatric: Her speech is normal.       Cooperative and appropriate    ED Course  Procedures (including critical care time)  Labs Reviewed - No data to display Dg Chest 2 View  06/17/2011  *RADIOLOGY REPORT*  Clinical Data: Cough and shortness of breath; history of asthma.  CHEST - 2 VIEW  Comparison: Chest radiograph performed 06/11/2010  Findings: The lungs are well-aerated and clear.  There is no evidence of focal opacification, pleural effusion or pneumothorax.  The heart is normal in size; the mediastinal contour is within normal limits.  No acute osseous abnormalities are seen.  Clips are noted within the right upper quadrant, reflecting prior cholecystectomy.  IMPRESSION: No acute cardiopulmonary process seen.  Original Report Authenticated By: Tonia Ghent, M.D.    Prednisone. Albuterol Atrovent.  Recheck after medications is subjectively improving with still some shortness of breath and decreased bilateral breath sounds. Repeat breathing treatment ordered.  Chest x-ray obtained and reviewed as above.  5:27 AM on recheck, feeling much better after second treatment and requesting to be discharged home. MDM   Presentation suggests asthma exacerbation. Improved with treatments as above. Nurse's notes reviewed. Vital signs reviewed.  reLiable historian verbalizes  understanding discharge and followup instructions. Prescription for prednisone provided. Patient has albuterol at home. Followup primary care as needed        Sunnie Nielsen, MD 06/17/11 1610  Sunnie Nielsen, MD 06/17/11 [phone]

## 2011-06-17 NOTE — ED Notes (Signed)
Pt alert, nad, c/o "asthma", resp even, unlabored, skin pwd, no wheezes noted, speaking full sentences

## 2011-06-17 NOTE — Discharge Instructions (Signed)
Asthma, Adult Asthma is caused by narrowing of the air passages in the lungs. It may be triggered by pollen, dust, animal dander, molds, some foods, respiratory infections, exposure to smoke, exercise, emotional stress or other allergens (things that cause allergic reactions or allergies). Repeat attacks are common. HOME CARE INSTRUCTIONS   Use prescription medications as ordered by your caregiver.   Avoid pollen, dust, animal dander, molds, smoke and other things that cause attacks at home and at work.   You may have fewer attacks if you decrease dust in your home. Electrostatic air cleaners may help.   It may help to replace your pillows or mattress with materials less likely to cause allergies.   Talk to your caregiver about an action plan for managing asthma attacks at home, including, the use of a peak flow meter which measures the severity of your asthma attack. An action plan can help minimize or stop the attack without having to seek medical care.   If you are not on a fluid restriction, drink 8 to 10 glasses of water each day.   Always have a plan prepared for seeking medical attention, including, calling your physician, accessing local emergency care, and calling 911 (in the [place] for a severe attack.   Discuss possible exercise routines with your caregiver.   If animal dander is the cause of asthma, you may need to get rid of pets.  SEEK MEDICAL CARE IF:   You have wheezing and shortness of breath even if taking medicine to prevent attacks.   You have muscle aches, chest pain or thickening of sputum.   Your sputum changes from clear or white to yellow, green, gray, or bloody.   You have any problems that may be related to the medicine you are taking (such as a rash, itching, swelling or trouble breathing).  SEEK IMMEDIATE MEDICAL CARE IF:   Your usual medicines do not stop your wheezing or there is increased coughing and/or shortness of breath.   You have increased  difficulty breathing.   You have a fever.  MAKE SURE YOU:   Understand these instructions.   Will watch your condition.   Will get help right away if you are not doing well or get worse.

## 2011-06-17 NOTE — ED Notes (Signed)
Pt c/o chest tightness, SOB, and non-productive cough that began Saturday afternoon. Pt has hx of asthma and has albuterol inhaler at home for exacerbations. Pt states inhaler was not effective today. Breathing became extremely difficult so she came to ED for tx. Pt had albuterol/atrovent breathing tx upon arrival to room and reports "some" relief.

## 2011-07-02 ENCOUNTER — Telehealth: Payer: Self-pay

## 2011-07-02 NOTE — Telephone Encounter (Signed)
Pt states the adderall script should have been rx 30mg   Take two in morning and one in evening. Please fix and call NEW PHONE NUMBER [phone]

## 2011-07-20 NOTE — Telephone Encounter (Signed)
Called pt to clarify what was needed. Pt stated that at her OV in Apr, Chelle had written her 3 Rxs for her Adderall, but she hasn't needed them until now and when she went to get the first one filled she saw that it was written for only one a day and she has been taking more than that for quite some time to treat both ADD and OCD. Her old Rxs were for either 1 or 1-2 Q am and 1 Qpm. Records of correct Rx sig in paper chart (Daily, in phone message stack at [facility] desk). Pt will bring in 3 incorrect Rxs to trade for corrected ones. Pt prefers to have the 3 written for next 3 months, but is OK w/only one for now if necessary.

## 2011-07-20 NOTE — Telephone Encounter (Signed)
Pt agreed to bring old Rxs back and we will give them to the PAs and they will rewrite the corrected ones.

## 2011-07-20 NOTE — Telephone Encounter (Signed)
Ok, ave patient come by with the incorrect Rx's so we can see them, and write correct ones.

## 2011-08-23 ENCOUNTER — Ambulatory Visit (INDEPENDENT_AMBULATORY_CARE_PROVIDER_SITE_OTHER): Payer: BC Managed Care – PPO | Admitting: Physician Assistant

## 2011-08-23 ENCOUNTER — Encounter: Payer: Self-pay | Admitting: Physician Assistant

## 2011-08-23 VITALS — BP 101/72 | HR 72 | Temp 98.2°F | Resp 18 | Ht 62.5 in | Wt 169.0 lb

## 2011-08-23 DIAGNOSIS — B009 Herpesviral infection, unspecified: Secondary | ICD-10-CM

## 2011-08-23 DIAGNOSIS — N898 Other specified noninflammatory disorders of vagina: Secondary | ICD-10-CM

## 2011-08-23 DIAGNOSIS — IMO0001 Reserved for inherently not codable concepts without codable children: Secondary | ICD-10-CM

## 2011-08-23 DIAGNOSIS — G4700 Insomnia, unspecified: Secondary | ICD-10-CM

## 2011-08-23 DIAGNOSIS — J309 Allergic rhinitis, unspecified: Secondary | ICD-10-CM

## 2011-08-23 DIAGNOSIS — F341 Dysthymic disorder: Secondary | ICD-10-CM

## 2011-08-23 DIAGNOSIS — F419 Anxiety disorder, unspecified: Secondary | ICD-10-CM

## 2011-08-23 DIAGNOSIS — J45909 Unspecified asthma, uncomplicated: Secondary | ICD-10-CM

## 2011-08-23 DIAGNOSIS — F32A Depression, unspecified: Secondary | ICD-10-CM

## 2011-08-23 DIAGNOSIS — N393 Stress incontinence (female) (male): Secondary | ICD-10-CM

## 2011-08-23 DIAGNOSIS — R059 Cough, unspecified: Secondary | ICD-10-CM

## 2011-08-23 DIAGNOSIS — K219 Gastro-esophageal reflux disease without esophagitis: Secondary | ICD-10-CM

## 2011-08-23 DIAGNOSIS — R05 Cough: Secondary | ICD-10-CM

## 2011-08-23 DIAGNOSIS — F909 Attention-deficit hyperactivity disorder, unspecified type: Secondary | ICD-10-CM

## 2011-08-23 DIAGNOSIS — Z79890 Hormone replacement therapy (postmenopausal): Secondary | ICD-10-CM

## 2011-08-23 DIAGNOSIS — M797 Fibromyalgia: Secondary | ICD-10-CM

## 2011-08-23 DIAGNOSIS — A599 Trichomoniasis, unspecified: Secondary | ICD-10-CM

## 2011-08-23 LAB — POCT UA - MICROSCOPIC ONLY
Casts, Ur, LPF, POC: NEGATIVE
Mucus, UA: NEGATIVE
Yeast, UA: NEGATIVE

## 2011-08-23 LAB — POCT URINALYSIS DIPSTICK
Bilirubin, UA: NEGATIVE
Nitrite, UA: NEGATIVE
Protein, UA: NEGATIVE
Urobilinogen, UA: 0.2
pH, UA: 5.5

## 2011-08-23 LAB — POCT WET PREP WITH KOH
RBC Wet Prep HPF POC: NEGATIVE
Yeast Wet Prep HPF POC: NEGATIVE

## 2011-08-23 MED ORDER — ALBUTEROL SULFATE HFA 108 (90 BASE) MCG/ACT IN AERS: 2.0000 | INHALATION_SPRAY | RESPIRATORY_TRACT | Status: DC | PRN

## 2011-08-23 MED ORDER — ZOLPIDEM TARTRATE 10 MG PO TABS: 10.0000 mg | ORAL_TABLET | Freq: Every evening | ORAL | Status: DC | PRN

## 2011-08-23 MED ORDER — AMPHETAMINE-DEXTROAMPHET ER 30 MG PO CP24: ORAL_CAPSULE | ORAL | Status: DC

## 2011-08-23 MED ORDER — EST ESTROGENS-METHYLTEST 0.625-1.25 MG PO TABS: 1.0000 | ORAL_TABLET | Freq: Every day | ORAL | Status: DC

## 2011-08-23 MED ORDER — MONTELUKAST SODIUM 10 MG PO TABS: 10.0000 mg | ORAL_TABLET | ORAL | Status: DC

## 2011-08-23 MED ORDER — FLUTICASONE PROPIONATE 50 MCG/ACT NA SUSP: 2.0000 | Freq: Every day | NASAL | Status: DC

## 2011-08-23 MED ORDER — ALPRAZOLAM 1 MG PO TABS: 1.0000 mg | ORAL_TABLET | Freq: Three times a day (TID) | ORAL | Status: DC | PRN

## 2011-08-23 MED ORDER — ALPRAZOLAM 1 MG PO TABS: 1.0000 mg | ORAL_TABLET | Freq: Three times a day (TID) | ORAL | Status: AC | PRN

## 2011-08-23 MED ORDER — PREDNISONE 20 MG PO TABS: ORAL_TABLET | ORAL | Status: AC

## 2011-08-23 MED ORDER — ESOMEPRAZOLE MAGNESIUM 40 MG PO CPDR: 40.0000 mg | DELAYED_RELEASE_CAPSULE | Freq: Two times a day (BID) | ORAL | Status: DC

## 2011-08-23 MED ORDER — FLUTICASONE-SALMETEROL 250-50 MCG/DOSE IN AEPB: 1.0000 | INHALATION_SPRAY | Freq: Two times a day (BID) | RESPIRATORY_TRACT | Status: DC

## 2011-08-23 MED ORDER — AMPHETAMINE-DEXTROAMPHET ER 25 MG PO CP24: ORAL_CAPSULE | ORAL | Status: DC

## 2011-08-23 MED ORDER — ALPRAZOLAM 1 MG PO TABS: 1.0000 mg | ORAL_TABLET | Freq: Every evening | ORAL | Status: AC | PRN

## 2011-08-23 MED ORDER — ZOLPIDEM TARTRATE 10 MG PO TABS: 10.0000 mg | ORAL_TABLET | Freq: Three times a day (TID) | ORAL | Status: DC | PRN

## 2011-08-23 MED ORDER — METRONIDAZOLE 500 MG PO TABS: 500.0000 mg | ORAL_TABLET | Freq: Two times a day (BID) | ORAL | Status: AC

## 2011-08-23 MED ORDER — BENZONATATE 100 MG PO CAPS: 100.0000 mg | ORAL_CAPSULE | Freq: Three times a day (TID) | ORAL | Status: AC | PRN

## 2011-08-23 MED ORDER — VENLAFAXINE HCL 75 MG PO TABS: 75.0000 mg | ORAL_TABLET | Freq: Two times a day (BID) | ORAL | Status: DC

## 2011-08-23 MED ORDER — VALACYCLOVIR HCL 500 MG PO TABS: 500.0000 mg | ORAL_TABLET | Freq: Every day | ORAL | Status: DC

## 2011-08-23 NOTE — Progress Notes (Signed)
Subjective:    Patient ID: Diana Benson, female    DOB: 12-Nov-1965, 46 y.o.   MRN: [id number]  HPI This 46 y.o. female presents for evaluation of vaginal itching and discharge, cough, and ADD.  She also needs refills of several of her medications. Vaginal symptoms have been present for a few days.  She believes she has trichomonas, and has ended her relationship as a result.  She would like prednisone for her cough and wheezing, caused by a recent allergy exacerbation.  She notes that the singulair is not adequately controlling her symptoms.  Has previously tried nasal steroid spray, but not in a while.  Adderall at her current dose is working well.  She still needs the Ambien and alprazolam to help her sleep.  Review of Systems As above.  Past Medical History  Diagnosis Date  . COPD (chronic obstructive pulmonary disease) with chronic bronchitis   . CVA (cerebral vascular accident)   . GERD (gastroesophageal reflux disease)   . S/P appy   . History of gastric bypass   . Hx of hysterectomy, total   . Asthma   . ADHD (attention deficit hyperactivity disorder)   . Anxiety   . Depression   . Fibromyalgia     Past Surgical History  Procedure Date  . Appendectomy   . Abdominal hysterectomy   . Gastric bypass     Prior to Admission medications   Medication Sig Start Date End Date Taking? Authorizing Provider  albuterol (PROVENTIL) (5 MG/ML) 0.5% nebulizer solution Take 2.5 mg by nebulization every 6 (six) hours as needed.   Yes Historical Provider, MD  ALPRAZolam Prudy Feeler) 1 MG tablet Take 1 tablet (1 mg total) by mouth 3 (three) times daily as needed. Patient is taking 1 every 6 hours prn. 04/19/11  Yes Luciel Brickman S Jaskirat Zertuche, PA-C  amphetamine-dextroamphetamine (ADDERALL XR, 30MG ,) 30 MG 24 hr capsule Take 30 mg by mouth. 2 tablets qam and 1 q evening.   Yes Historical Provider, MD  DULoxetine (CYMBALTA) 60 MG capsule Take 1 capsule (60 mg total) by mouth 2 (two) times daily. 04/19/11  Yes  Kiela Shisler S Rector Devonshire, PA-C  esomeprazole (NEXIUM) 40 MG capsule Take 40 mg by mouth 1 day or 1 dose.   Yes Historical Provider, MD  estrogen-methylTESTOSTERone (ESTRATEST HS) 0.625-1.25 MG per tablet Take 1 tablet by mouth daily.   Yes Historical Provider, MD  Fluticasone-Salmeterol (ADVAIR) 250-50 MCG/DOSE AEPB Inhale 1 puff into the lungs every 12 (twelve) hours.   Yes Historical Provider, MD  montelukast (SINGULAIR) 10 MG tablet Take 1 tablet (10 mg total) by mouth 1 day or 1 dose. 04/19/11  Yes Xylah Early S Malissie Musgrave, PA-C  PROAIR HFA 108 (90 BASE) MCG/ACT inhaler INHALE 2 PUFFS EVERY 4 TO 6 HOURS AS NEEDED 05/06/11  Yes Chaunte Hornbeck S Miking Usrey, PA-C  valACYclovir (VALTREX) 500 MG tablet Take 500 mg by mouth daily.   Yes Historical Provider, MD  zolpidem (AMBIEN) 10 MG tablet Take 1-1.5 tablets (10-15 mg total) by mouth at bedtime as needed. 04/19/11  Yes Ezri Fanguy Tessa Lerner, PA-C    Allergies  Allergen Reactions  . Codeine Nausea And Vomiting  . Morphine And Related Nausea And Vomiting    History   Social History  . Marital Status: Divorced    Spouse Name: N/A    Number of Children: 2  . Years of Education: 14   Occupational History  . office administrator     [employer]   Social History Main Topics  .  Smoking status: Former Smoker -- 1.0 packs/day for 17 years    Types: Cigarettes    Quit date: 06/09/2010  . Smokeless tobacco: Never Used  . Alcohol Use: Yes     rare  . Drug Use: No  . Sexually Active: Yes -- Female partner(s)    Birth Control/ Protection: Surgical     total hysterectomy   Other Topics Concern  . Not on file   Social History Narrative   Son (age 3) living with Dad in [place]. No contact, but close to being able to speak to him by phone.Daughter has had significant issues with substance abuse and prostitution.  Is pregnant, and son is due 12/2011. Living with the patient now.    Family History  Problem Relation Age of Onset  . Osteoporosis Mother   . Stroke Father   .  Drug abuse Daughter        Objective:   Physical Exam Blood pressure 101/72, pulse 72, temperature 98.2 F (36.8 C), resp. rate 18, height 5' 2.5" (1.588 m), weight 169 lb (76.658 kg), SpO2 98.00%. Body mass index is 30.42 kg/(m^2). Well-developed, well nourished WF who is awake, alert and oriented, in NAD. HEENT: [place]/AT, sclera and conjunctiva are clear.  EAC are patent, TMs are normal in appearance. Nasal mucosa is pink and moist. OP is clear. Neck: supple, non-tender, no lymphadenopathy, thyromegaly. Heart: RRR, no murmur Lungs: normal effort, initial soft wheezes, resolve with cough. Skin: warm and dry without rash.  Results for orders placed in visit on 08/23/11  POCT WET PREP WITH KOH      Component Value Range   Trichomonas, UA Positive     Clue Cells Wet Prep HPF POC 0-12     Epithelial Wet Prep HPF POC 10-28     Yeast Wet Prep HPF POC neg     Bacteria Wet Prep HPF POC 1+     RBC Wet Prep HPF POC neg     WBC Wet Prep HPF POC 6-20     KOH Prep POC Negative    POCT UA - MICROSCOPIC ONLY      Component Value Range   WBC, Ur, HPF, POC 1-8     RBC, urine, microscopic 0-3     Bacteria, U Microscopic neg     Mucus, UA neg     Epithelial cells, urine per micros 0-6     Crystals, Ur, HPF, POC neg     Casts, Ur, LPF, POC neg     Yeast, UA neg    POCT URINALYSIS DIPSTICK      Component Value Range   Color, UA yellow     Clarity, UA clear     Glucose, UA neg     Bilirubin, UA neg     Ketones, UA neg     Spec Grav, UA 1.025     Blood, UA trace     pH, UA 5.5     Protein, UA neg     Urobilinogen, UA 0.2     Nitrite, UA neg     Leukocytes, UA small (1+)        Assessment & Plan:   1. Insomnia  zolpidem (AMBIEN) 10 MG tablet  2. Anxiety and depression  ALPRAZolam (XANAX) 1 MG tablet, venlafaxine (EFFEXOR) 75 MG tablet  3. Fibromyalgia    4. AR (allergic rhinitis)  montelukast (SINGULAIR) 10 MG tablet, fluticasone (FLONASE) 50 MCG/ACT nasal spray  5. Asthma  albuterol  (PROAIR HFA) 108 (90 BASE)  MCG/ACT inhaler, Fluticasone-Salmeterol (ADVAIR) 250-50 MCG/DOSE AEPB  6. HSV-1 (herpes simplex virus 1) infection  valACYclovir (VALTREX) 500 MG tablet  7. GERD (gastroesophageal reflux disease)  esomeprazole (NEXIUM) 40 MG capsule  8. Postmenopausal HRT (hormone replacement therapy)  estrogen-methylTESTOSTERone 0.625-1.25 MG per tablet  9. ADHD (attention deficit hyperactivity disorder)  amphetamine-dextroamphetamine (ADDERALL XR) 30 MG 24 hr capsule  10. Leukorrhea, not specified as infective  POCT Wet Prep with KOH  11. Cough  predniSONE (DELTASONE) 20 MG tablet, benzonatate (TESSALON) 100 MG capsule  12. Stress incontinence  POCT UA - Microscopic Only, POCT urinalysis dipstick  13. Trichomonas  metroNIDAZOLE (FLAGYL) 500 MG tablet

## 2011-08-24 ENCOUNTER — Telehealth: Payer: Self-pay | Admitting: Radiology

## 2011-08-24 NOTE — Telephone Encounter (Signed)
i spoke to pharmacy about patients rx , she presented with Rx dated for October I explained we gave Rx for this month September and October. Pharmacy will contact the patient and get the correct Rx from her.

## 2011-08-29 ENCOUNTER — Telehealth: Payer: Self-pay

## 2011-08-29 NOTE — Telephone Encounter (Signed)
LMOM for pt explaining that we have tried to get her Adderall XR 30 covered by her ins, but they have denied coverage of the three per day that she has been taking. Chelle asked me to call pt and ask her if she wants to pay OOP or if pt would like Chelle to try to Rx the reg Adderall for pt? Asked pt to CB.

## 2011-08-31 NOTE — Telephone Encounter (Signed)
LMOM to CB. 

## 2011-09-03 NOTE — Telephone Encounter (Signed)
LMOM to CB. 

## 2011-09-04 NOTE — Telephone Encounter (Signed)
Spoke w/pt who reported that she got the letter from [facility] yesterday so she was aware they are not covering her dose of Adderall XR. Pt stated that is fine because her other ins covers it and she will take Rx back to [facility] under other ins and it should not be a problem.

## 2011-09-19 ENCOUNTER — Ambulatory Visit (INDEPENDENT_AMBULATORY_CARE_PROVIDER_SITE_OTHER): Payer: BC Managed Care – PPO | Admitting: Physician Assistant

## 2011-09-19 VITALS — BP 102/64 | HR 82 | Temp 98.2°F | Resp 18 | Ht 63.5 in | Wt 171.0 lb

## 2011-09-19 DIAGNOSIS — F329 Major depressive disorder, single episode, unspecified: Secondary | ICD-10-CM

## 2011-09-19 DIAGNOSIS — F419 Anxiety disorder, unspecified: Secondary | ICD-10-CM

## 2011-09-19 DIAGNOSIS — H109 Unspecified conjunctivitis: Secondary | ICD-10-CM

## 2011-09-19 DIAGNOSIS — F341 Dysthymic disorder: Secondary | ICD-10-CM

## 2011-09-19 DIAGNOSIS — J310 Chronic rhinitis: Secondary | ICD-10-CM

## 2011-09-19 MED ORDER — TOBRAMYCIN 0.3 % OP SOLN: 1.0000 [drp] | OPHTHALMIC | Status: DC

## 2011-09-19 MED ORDER — VENLAFAXINE HCL 100 MG PO TABS: 100.0000 mg | ORAL_TABLET | Freq: Two times a day (BID) | ORAL | Status: DC

## 2011-09-19 MED ORDER — HYDROXYZINE HCL 25 MG PO TABS: 12.5000 mg | ORAL_TABLET | Freq: Three times a day (TID) | ORAL | Status: DC | PRN

## 2011-09-19 NOTE — Patient Instructions (Signed)
Apply warm compresses to both eyes for 15-20 minutes to ease your discomfort.  Wipe down ALL the surfaces you touch, including the doorknobs, toilet flush and faucets!  Wash your bed linens daily until your symptoms resolve.

## 2011-09-20 NOTE — Progress Notes (Signed)
Subjective:    Patient ID: Diana Benson, female    DOB: 14-Jan-1965, 46 y.o.   MRN: [id number]  HPI This 46 y.o. female presents for evaluation of eyelid swelling, crusting, and drainage.  Symptoms were present this morning upon waking and have persisted all day.  The swelling is worse on the left.  Drainage is yellow and thick, and she's tearing.  Sensation of grit in her eyes.  Photophobia present.  No fever, chills.  Increased allergy symptoms, requests hydroxyzine, since Benadryl isn't effective.  Feels like lips are swollen, too.  No tongue swelling.  No scratchiness in the throat. No known contacts with conjunctivitis.  Notes that her anxiety and depression symptoms are not adequately controlled on the current dose of Effexor (changed from Cymbalta because her insurance stopped covering it).  No SI/HI.  Review of Systems As above.   Past Medical History  Diagnosis Date  . COPD (chronic obstructive pulmonary disease) with chronic bronchitis   . CVA (cerebral vascular accident)   . GERD (gastroesophageal reflux disease)   . S/P appy   . History of gastric bypass   . Hx of hysterectomy, total   . Asthma   . ADHD (attention deficit hyperactivity disorder)   . Anxiety   . Depression   . Fibromyalgia     Past Surgical History  Procedure Date  . Appendectomy   . Abdominal hysterectomy   . Gastric bypass     Prior to Admission medications   Medication Sig Start Date End Date Taking? Authorizing Provider  albuterol (PROAIR HFA) 108 (90 BASE) MCG/ACT inhaler Inhale 2 puffs into the lungs every 4 (four) hours as needed for wheezing or shortness of breath (cough). 08/23/11  Yes Arina Torry S Athan Casalino, PA-C  albuterol (PROVENTIL) (5 MG/ML) 0.5% nebulizer solution Take 2.5 mg by nebulization every 6 (six) hours as needed.   Yes Historical Provider, MD  ALPRAZolam Prudy Feeler) 1 MG tablet Take 1 tablet (1 mg total) by mouth 3 (three) times daily as needed. Patient is taking 1 every 6 hours prn.  08/23/11  Yes Laelah Siravo S Bonniejean Piano, PA-C  ALPRAZolam (XANAX) 1 MG tablet Take 1 tablet (1 mg total) by mouth at bedtime as needed for sleep. May fill on/after 09/23/2011 08/23/11 09/22/11 Yes Arabella Revelle S Kleo Dungee, PA-C  ALPRAZolam (XANAX) 1 MG tablet Take 1 tablet (1 mg total) by mouth 3 (three) times daily as needed. may fill on/after 10/23/2011 08/23/11 09/22/11 Yes Paislei Dorval S Dameer Speiser, PA-C  amphetamine-dextroamphetamine (ADDERALL XR) 30 MG 24 hr capsule 2 tablets qam and 1 q evening. BRAND MEDICALLY NECESSARY. 08/23/11  Yes Lidie Glade S Jerian Morais, PA-C  amphetamine-dextroamphetamine (ADDERALL XR) 30 MG 24 hr capsule 2 PO QAM, 1 PO QPM; BRAND MEDICALLY NECESSARY; may fill on/after 10/23/2011 08/23/11  Yes Kyser Wandel S Selig Wampole, PA-C  amphetamine-dextroamphetamine (ADDERALL XR) 30 MG 24 hr capsule 2 PO QAM, 1 PO QPM; BRAND MEDICALLY NECESSARY; may fill on/after 09/23/2011 08/23/11  Yes Kortney Potvin S Melany Wiesman, PA-C  esomeprazole (NEXIUM) 40 MG capsule Take 1 capsule (40 mg total) by mouth 2 (two) times daily. 08/23/11  Yes Kenetra Hildenbrand S Riven Beebe, PA-C  estrogen-methylTESTOSTERone 0.625-1.25 MG per tablet Take 1 tablet by mouth daily. 08/23/11  Yes Analese Sovine S Saadiya Wilfong, PA-C  fluticasone (FLONASE) 50 MCG/ACT nasal spray Place 2 sprays into the nose daily. 08/23/11 08/22/12 Yes Nochum Fenter S Ardell Aaronson, PA-C  Fluticasone-Salmeterol (ADVAIR) 250-50 MCG/DOSE AEPB Inhale 1 puff into the lungs every 12 (twelve) hours. 08/23/11  Yes Kameo Bains S Elvi Leventhal, PA-C  montelukast (SINGULAIR) 10 MG tablet  Take 1 tablet (10 mg total) by mouth 1 day or 1 dose. 08/23/11  Yes Bayden Gil S Argenis Kumari, PA-C  valACYclovir (VALTREX) 500 MG tablet Take 1 tablet (500 mg total) by mouth daily. 08/23/11  Yes Bliss Tsang S Gavriella Hearst, PA-C  venlafaxine (EFFEXOR) 100 MG tablet Take 1 tablet (100 mg total) by mouth 2 (two) times daily. 09/19/11 12/18/11 Yes Merlin Golden S Shulem Mader, PA-C  zolpidem (AMBIEN) 10 MG tablet Take 1-1.5 tablets (10-15 mg total) by mouth at bedtime as needed. 08/23/11  Yes Deshanna Kama S Lindsey Demonte, PA-C    zolpidem (AMBIEN) 10 MG tablet Take 1-1.5 tablets (10-15 mg total) by mouth at bedtime as needed for sleep. may fill on/after 10/23/2011 08/23/11 09/22/11 Yes Teagyn Fishel S Leily Capek, PA-C  zolpidem (AMBIEN) 10 MG tablet Take 1 tablet (10 mg total) by mouth at bedtime as needed for sleep. May fill on/after 09/23/2011 08/23/11 09/22/11 Yes Tymeer Vaquera S Shantelle Alles, PA-C  hydrOXYzine (ATARAX/VISTARIL) 25 MG tablet Take 0.5-1 tablets (12.5-25 mg total) by mouth every 8 (eight) hours as needed for itching. 09/19/11   Aiesha Leland S Adham Johnson, PA-C  tobramycin (TOBREX) 0.3 % ophthalmic solution Place 1 drop into both eyes every 4 (four) hours. 09/19/11   Cherylee Rawlinson Tessa Lerner, PA-C    Allergies  Allergen Reactions  . Codeine Nausea And Vomiting  . Morphine And Related Nausea And Vomiting    History   Social History  . Marital Status: Divorced    Spouse Name: N/A    Number of Children: 2  . Years of Education: 14   Occupational History  . office administrator     [employer]   Social History Main Topics  . Smoking status: Former Smoker -- 1.0 packs/day for 17 years    Types: Cigarettes    Quit date: 06/09/2010  . Smokeless tobacco: Never Used  . Alcohol Use: Yes     rare  . Drug Use: No  . Sexually Active: Yes -- Female partner(s)    Birth Control/ Protection: Surgical     total hysterectomy   Other Topics Concern  . Not on file   Social History Narrative   Son (age 57) living with Dad in [place]. No contact, but close to being able to speak to him by phone.Daughter has had significant issues with substance abuse and prostitution.  Is pregnant, and son is due 12/2011. Living with the patient now.    Family History  Problem Relation Age of Onset  . Osteoporosis Mother   . Stroke Father   . Drug abuse Daughter        Objective:   Physical Exam Blood pressure 102/64, pulse 82, temperature 98.2 F (36.8 C), temperature source Oral, resp. rate 18, height 5' 3.5" (1.613 m), weight 171 lb (77.565 kg), SpO2  98.00%. Body mass index is 29.82 kg/(m^2). Well-developed, well nourished WF who is awake, alert and oriented, in NAD. HEENT: [place]/AT, PERRL, EOMI.  Sclera are clear, mild injection of the conjunctiva, worse on the right.  Funduscopic exam normal bilaterally. OP is clear. Neck: supple, non-tender, no lymphadenopathy, thyromegaly. Heart: RRR, no murmur Lungs: normal effort, CTA Extremities: no cyanosis, clubbing or edema. Skin: warm and dry without rash. Neurologic: CN II-XII intact.  Good strength. Psychologic:  Normal behavior, mood and affect.      Assessment & Plan:   1. Conjunctivitis  tobramycin (TOBREX) 0.3 % ophthalmic solution  2. Anxiety and depression  venlafaxine (EFFEXOR) 100 MG tablet  3. Rhinitis  hydrOXYzine (ATARAX/VISTARIL) 25 MG tablet   Patient Instructions  Apply warm compresses to both eyes for 15-20 minutes to ease your discomfort.  Wipe down ALL the surfaces you touch, including the doorknobs, toilet flush and faucets!  Wash your bed linens daily until your symptoms resolve.

## 2011-10-20 ENCOUNTER — Emergency Department (HOSPITAL_COMMUNITY): Payer: BC Managed Care – PPO

## 2011-10-20 ENCOUNTER — Encounter (HOSPITAL_COMMUNITY): Payer: Self-pay | Admitting: Emergency Medicine

## 2011-10-20 ENCOUNTER — Emergency Department (HOSPITAL_COMMUNITY)
Admission: EM | Admit: 2011-10-20 | Discharge: 2011-10-20 | Disposition: A | Discharge status: Home / Self Care | Payer: BC Managed Care – PPO | Attending: Emergency Medicine | Admitting: Emergency Medicine

## 2011-10-20 DIAGNOSIS — J4489 Other specified chronic obstructive pulmonary disease: Secondary | ICD-10-CM | POA: Insufficient documentation

## 2011-10-20 DIAGNOSIS — J45901 Unspecified asthma with (acute) exacerbation: Secondary | ICD-10-CM | POA: Insufficient documentation

## 2011-10-20 DIAGNOSIS — Z9089 Acquired absence of other organs: Secondary | ICD-10-CM | POA: Insufficient documentation

## 2011-10-20 DIAGNOSIS — J3089 Other allergic rhinitis: Secondary | ICD-10-CM | POA: Insufficient documentation

## 2011-10-20 DIAGNOSIS — J449 Chronic obstructive pulmonary disease, unspecified: Secondary | ICD-10-CM | POA: Insufficient documentation

## 2011-10-20 DIAGNOSIS — Z79899 Other long term (current) drug therapy: Secondary | ICD-10-CM | POA: Insufficient documentation

## 2011-10-20 DIAGNOSIS — J3081 Allergic rhinitis due to animal (cat) (dog) hair and dander: Secondary | ICD-10-CM

## 2011-10-20 DIAGNOSIS — Z8673 Personal history of transient ischemic attack (TIA), and cerebral infarction without residual deficits: Secondary | ICD-10-CM | POA: Insufficient documentation

## 2011-10-20 DIAGNOSIS — M79609 Pain in unspecified limb: Secondary | ICD-10-CM | POA: Insufficient documentation

## 2011-10-20 DIAGNOSIS — F341 Dysthymic disorder: Secondary | ICD-10-CM | POA: Insufficient documentation

## 2011-10-20 MED ORDER — CYCLOBENZAPRINE HCL 10 MG PO TABS: 10.0000 mg | ORAL_TABLET | Freq: Two times a day (BID) | ORAL | Status: DC | PRN

## 2011-10-20 MED ORDER — ALBUTEROL SULFATE HFA 108 (90 BASE) MCG/ACT IN AERS
2.0000 | INHALATION_SPRAY | RESPIRATORY_TRACT | Status: DC | PRN
  Filled 2011-10-20: qty 6.7

## 2011-10-20 MED ORDER — IPRATROPIUM BROMIDE 0.02 % IN SOLN
0.5000 mg | Freq: Once | RESPIRATORY_TRACT | Status: AC
  Administered 2011-10-20: 0.5 mg via RESPIRATORY_TRACT
  Filled 2011-10-20: qty 2.5

## 2011-10-20 MED ORDER — ALBUTEROL SULFATE (5 MG/ML) 0.5% IN NEBU
5.0000 mg | INHALATION_SOLUTION | Freq: Once | RESPIRATORY_TRACT | Status: AC
  Administered 2011-10-20: 5 mg via RESPIRATORY_TRACT
  Filled 2011-10-20: qty 1

## 2011-10-20 MED ORDER — PREDNISONE 20 MG PO TABS: 60.0000 mg | ORAL_TABLET | Freq: Every day | ORAL | Status: DC

## 2011-10-20 MED ORDER — OXYCODONE-ACETAMINOPHEN 5-325 MG PO TABS: 1.0000 | ORAL_TABLET | Freq: Four times a day (QID) | ORAL | Status: DC | PRN

## 2011-10-20 MED ORDER — GUAIFENESIN 100 MG/5ML PO SOLN
5.0000 mL | Freq: Once | ORAL | Status: AC
  Administered 2011-10-20: 100 mg via ORAL
  Filled 2011-10-20: qty 5

## 2011-10-20 MED ORDER — METHYLPREDNISOLONE SODIUM SUCC 125 MG IJ SOLR
125.0000 mg | Freq: Once | INTRAMUSCULAR | Status: AC
  Administered 2011-10-20: 125 mg via INTRAMUSCULAR
  Filled 2011-10-20: qty 2

## 2011-10-20 MED ORDER — DIPHENHYDRAMINE HCL 25 MG PO CAPS
25.0000 mg | ORAL_CAPSULE | Freq: Once | ORAL | Status: AC
  Administered 2011-10-20: 25 mg via ORAL
  Filled 2011-10-20: qty 1

## 2011-10-20 NOTE — ED Notes (Signed)
Pt recently moved into a dusty house and has 2 cats that she is allergic to.  States that her asthma is flaring up.  C/o chest tightness, left arm pain.

## 2011-10-20 NOTE — ED Provider Notes (Signed)
History     CSN: [id number]  Arrival date & time 10/20/11  1027   First MD Initiated Contact with Patient 10/20/11 2016      Chief Complaint  Patient presents with  . Asthma  . Arm Pain    (Consider location/radiation/quality/duration/timing/severity/associated sxs/prior treatment) HPI  She presents to the emergency department with complaints of back pain, allergies, asthma exacerbation. She states that she has been moving into a new apartment and it is very dusty, that people had cats, and his cobwebs everywhere. She says she is allergic to dust, dust mites and cats. She thinks that her muscles are probably sore from all the moving. She says that she has used her nebulizer at home but she still feels like she cannot earlier shortness of breath. She denies having left arm or right arm pain but does have upper and low back pain. She has not had any diaphoresis, nausea, vomiting, diarrhea, abdominal pain, fevers or chills. vss nad  Past Medical History  Diagnosis Date  . COPD (chronic obstructive pulmonary disease) with chronic bronchitis   . CVA (cerebral vascular accident)   . GERD (gastroesophageal reflux disease)   . S/P appy   . History of gastric bypass   . Hx of hysterectomy, total   . Asthma   . ADHD (attention deficit hyperactivity disorder)   . Anxiety   . Depression   . Fibromyalgia     Past Surgical History  Procedure Date  . Appendectomy   . Abdominal hysterectomy   . Gastric bypass     Family History  Problem Relation Age of Onset  . Osteoporosis Mother   . Stroke Father   . Drug abuse Daughter     History  Substance Use Topics  . Smoking status: Former Smoker -- 1.0 packs/day for 17 years    Types: Cigarettes    Quit date: 06/09/2010  . Smokeless tobacco: Never Used  . Alcohol Use: Yes     rare    OB History    Grav Para Term Preterm Abortions TAB SAB Ect Mult Living                  Review of Systems   Review of Systems  Gen: no  weight loss, fevers, chills, night sweats  Eyes: no discharge or drainage, no occular pain or visual changes  Nose: no epistaxis or rhinorrhea  Mouth: no dental pain, no sore throat  Neck: no neck pain  Lungs: + wheezing, coughing no hemoptysis CV: no chest pain, palpitations, dependent edema or orthopnea  Abd: no abdominal pain, nausea, vomiting  GU: no dysuria or gross hematuria  MSK:  Upper and lower back pain Neuro: no headache, no focal neurologic deficits  Skin: no abnormalities Psyche: negative.    Allergies  Codeine and Morphine and related  Home Medications   Current Outpatient Rx  Name Route Sig Dispense Refill  . ALBUTEROL SULFATE HFA 108 (90 BASE) MCG/ACT IN AERS Inhalation Inhale 2 puffs into the lungs every 4 (four) hours as needed for wheezing or shortness of breath (cough). 1 Inhaler 2  . ALBUTEROL SULFATE (5 MG/ML) 0.5% IN NEBU Nebulization Take 2.5 mg by nebulization every 6 (six) hours as needed.    . ALPRAZOLAM 1 MG PO TABS Oral Take 1 tablet (1 mg total) by mouth 3 (three) times daily as needed. Patient is taking 1 every 6 hours prn. 90 tablet 0  . AMPHETAMINE-DEXTROAMPHET ER 30 MG PO CP24  2 tablets qam  and 1 q evening. BRAND MEDICALLY NECESSARY. 90 capsule 0  . AMPHETAMINE-DEXTROAMPHET ER 30 MG PO CP24  2 PO QAM, 1 PO QPM; BRAND MEDICALLY NECESSARY; may fill on/after 09/23/2011 90 capsule 0  . ESOMEPRAZOLE MAGNESIUM 40 MG PO CPDR Oral Take 1 capsule (40 mg total) by mouth 2 (two) times daily. 60 capsule 5  . EST ESTROGENS-METHYLTEST 0.625-1.25 MG PO TABS Oral Take 1 tablet by mouth daily. 30 tablet 5  . FLUTICASONE PROPIONATE 50 MCG/ACT NA SUSP Nasal Place 2 sprays into the nose daily. 16 g 12  . FLUTICASONE-SALMETEROL 250-50 MCG/DOSE IN AEPB Inhalation Inhale 1 puff into the lungs every 12 (twelve) hours. 60 each 12  . MONTELUKAST SODIUM 10 MG PO TABS Oral Take 1 tablet (10 mg total) by mouth 1 day or 1 dose. 30 tablet 12  . VALACYCLOVIR HCL 500 MG PO TABS Oral  Take 1 tablet (500 mg total) by mouth daily. 30 tablet 12  . VENLAFAXINE HCL 100 MG PO TABS Oral Take 1 tablet (100 mg total) by mouth 2 (two) times daily. 60 tablet 5  . ZOLPIDEM TARTRATE 10 MG PO TABS Oral Take 1-1.5 tablets (10-15 mg total) by mouth at bedtime as needed. 30 tablet 0  . CYCLOBENZAPRINE HCL 10 MG PO TABS Oral Take 1 tablet (10 mg total) by mouth 2 (two) times daily as needed for muscle spasms. 20 tablet 0  . HYDROXYZINE HCL 25 MG PO TABS Oral Take 0.5-1 tablets (12.5-25 mg total) by mouth every 8 (eight) hours as needed for itching. 30 tablet 0  . OXYCODONE-ACETAMINOPHEN 5-325 MG PO TABS Oral Take 1 tablet by mouth every 6 (six) hours as needed for pain. 6 tablet 0  . PREDNISONE 20 MG PO TABS Oral Take 3 tablets (60 mg total) by mouth daily. 15 tablet 0  . TOBRAMYCIN SULFATE 0.3 % OP SOLN Both Eyes Place 1 drop into both eyes every 4 (four) hours. 5 mL 0  . ZOLPIDEM TARTRATE 10 MG PO TABS Oral Take 1-1.5 tablets (10-15 mg total) by mouth at bedtime as needed for sleep. may fill on/after 10/23/2011 45 tablet 0  . ZOLPIDEM TARTRATE 10 MG PO TABS Oral Take 1 tablet (10 mg total) by mouth at bedtime as needed for sleep. May fill on/after 09/23/2011 45 tablet 0    BP 100/68  Pulse 81  Temp 97.7 F (36.5 C) (Oral)  Resp 20  SpO2 98%  Physical Exam  Nursing note and vitals reviewed. Constitutional: She appears well-developed and well-nourished. No distress.  HENT:  Head: Normocephalic and atraumatic.  Eyes: Pupils are equal, round, and reactive to light.  Neck: Normal range of motion. Neck supple.  Cardiovascular: Normal rate and regular rhythm.   Pulmonary/Chest: Effort normal. No respiratory distress. She has wheezes (decreased airway movement). She has no rales. She exhibits no tenderness.  Abdominal: Soft.  Musculoskeletal:       Back:        Equal strength to bilateral lower extremities. Neurosensory  function adequate to both legs. Skin color is normal. Skin is warm  and moist. I see no step off deformity, no bony tenderness. Pt is able to ambulate without limp. Pain is relieved when sitting in certain positions. ROM is decreased due to pain. No crepitus, laceration, effusion, swelling.  Pulses are normal   Neurological: She is alert.  Skin: Skin is warm and dry.    ED Course  Procedures (including critical care time)  Labs Reviewed - No data to  display Dg Chest 2 View  10/20/2011  *RADIOLOGY REPORT*  Clinical Data: Short of breath.  Asthma  CHEST - 2 VIEW  Comparison: 06/17/2011  Findings: Lung volume is normal.  Negative for infiltrate or effusion.  No heart failure or mass lesion.  IMPRESSION: No acute cardiopulmonary disease.   Original Report Authenticated By: Camelia Phenes, M.D.      1. Asthma exacerbation   2. Cat allergies       MDM  Patient given oral Benadryl, one 25 mg injection of Solu-Medrol, 2 albuterol treatments one nebulizer and the second one with Atrovent in it as well. After monitoring agent she is now feeling much better. Her lungs have resolved greatly. He she requests pain medication for her back and shoulder pains.  Rx for percocet (6 tabs), Flexural , prednisone.  Pt has been advised of the symptoms that warrant their return to the ED. Patient has voiced understanding and has agreed to follow-up with the PCP or specialist.         Dorthula Matas, PA 10/20/11 2203

## 2011-10-20 NOTE — ED Provider Notes (Signed)
Medical screening examination/treatment/procedure(s) were performed by non-physician practitioner and as supervising physician I was immediately available for consultation/collaboration.  Cheri Guppy, MD 10/20/11 2225

## 2011-10-25 ENCOUNTER — Ambulatory Visit (INDEPENDENT_AMBULATORY_CARE_PROVIDER_SITE_OTHER): Payer: BC Managed Care – PPO | Admitting: Family Medicine

## 2011-10-25 ENCOUNTER — Ambulatory Visit: Payer: BC Managed Care – PPO

## 2011-10-25 VITALS — BP 113/73 | HR 72 | Temp 98.1°F | Resp 16 | Ht 62.25 in | Wt 164.0 lb

## 2011-10-25 DIAGNOSIS — S8010XA Contusion of unspecified lower leg, initial encounter: Secondary | ICD-10-CM

## 2011-10-25 DIAGNOSIS — G4700 Insomnia, unspecified: Secondary | ICD-10-CM

## 2011-10-25 DIAGNOSIS — F909 Attention-deficit hyperactivity disorder, unspecified type: Secondary | ICD-10-CM

## 2011-10-25 DIAGNOSIS — F341 Dysthymic disorder: Secondary | ICD-10-CM

## 2011-10-25 DIAGNOSIS — F419 Anxiety disorder, unspecified: Secondary | ICD-10-CM

## 2011-10-25 DIAGNOSIS — S20219A Contusion of unspecified front wall of thorax, initial encounter: Secondary | ICD-10-CM

## 2011-10-25 DIAGNOSIS — S40029A Contusion of unspecified upper arm, initial encounter: Secondary | ICD-10-CM

## 2011-10-25 MED ORDER — ALPRAZOLAM 1 MG PO TABS: 1.0000 mg | ORAL_TABLET | Freq: Three times a day (TID) | ORAL | Status: DC | PRN

## 2011-10-25 MED ORDER — AMPHETAMINE-DEXTROAMPHET ER 30 MG PO CP24: ORAL_CAPSULE | ORAL | Status: DC

## 2011-10-25 NOTE — Patient Instructions (Signed)
Warm compresses can help.  Rest.

## 2011-10-25 NOTE — Progress Notes (Signed)
Subjective:    Patient ID: Diana Benson, female    DOB: 28-Feb-1965, 46 y.o.   MRN: [id number]  HPI This 46 y.o. female presents for evaluation of breast, arm and leg pain after a fall yesterday.  Moved into a new home.  Last night while moving her TV, she dropped it on her RIGHT thigh and then fell over it, landing on it's corner in the RIGHT breast.  Pain with breathing. No shortness of breath or chest pressure, no neck pain.  No head injury.  Took cyclobenzaprine last night and again today.  Is concerned she may have a broken rib or injured her lung.  Review of Systems As above.  She also requests refills of Adderall and Alprazolam.  Chart is reviewed.  She is doing really well overall.  Just started the higher dose of Effexor, and had some nausea today when she took it on an empty stomach.   Past Medical History  Diagnosis Date  . COPD (chronic obstructive pulmonary disease) with chronic bronchitis   . CVA (cerebral vascular accident)   . GERD (gastroesophageal reflux disease)   . S/P appy   . History of gastric bypass   . Hx of hysterectomy, total   . Asthma   . ADHD (attention deficit hyperactivity disorder)   . Anxiety   . Depression   . Fibromyalgia     Past Surgical History  Procedure Date  . Appendectomy   . Abdominal hysterectomy   . Gastric bypass     Prior to Admission medications   Medication Sig Start Date End Date Taking? Authorizing Provider  albuterol (PROAIR HFA) 108 (90 BASE) MCG/ACT inhaler Inhale 2 puffs into the lungs every 4 (four) hours as needed for wheezing or shortness of breath (cough). 08/23/11  Yes Myldred Raju S Tyisha Cressy, PA-C  albuterol (PROVENTIL) (5 MG/ML) 0.5% nebulizer solution Take 2.5 mg by nebulization every 6 (six) hours as needed.   Yes Historical Provider, MD  esomeprazole (NEXIUM) 40 MG capsule Take 1 capsule (40 mg total) by mouth 2 (two) times daily. 08/23/11  Yes Lukka Black S Abigale Dorow, PA-C  estrogen-methylTESTOSTERone 0.625-1.25 MG per tablet  Take 1 tablet by mouth daily. 08/23/11  Yes Dalina Samara S Onie Hayashi, PA-C  fluticasone (FLONASE) 50 MCG/ACT nasal spray Place 2 sprays into the nose daily. 08/23/11 08/22/12 Yes Aluel Schwarz S Meredith Kilbride, PA-C  Fluticasone-Salmeterol (ADVAIR) 250-50 MCG/DOSE AEPB Inhale 1 puff into the lungs every 12 (twelve) hours. 08/23/11  Yes Shykeria Sakamoto S Adden Strout, PA-C  montelukast (SINGULAIR) 10 MG tablet Take 1 tablet (10 mg total) by mouth 1 day or 1 dose. 08/23/11  Yes Estela Vinal S Arbell Wycoff, PA-C  valACYclovir (VALTREX) 500 MG tablet Take 1 tablet (500 mg total) by mouth daily. 08/23/11  Yes Eleah Lahaie S Natan Hartog, PA-C  venlafaxine (EFFEXOR) 100 MG tablet Take 1 tablet (100 mg total) by mouth 2 (two) times daily. 09/19/11 12/18/11 Yes Trevell Pariseau S Maxon Kresse, PA-C  zolpidem (AMBIEN) 10 MG tablet Take 1-1.5 tablets (10-15 mg total) by mouth at bedtime as needed. 08/23/11  Yes Lerin Jech S Malon Branton, PA-C  cyclobenzaprine (FLEXERIL) 10 MG tablet Take 1 tablet (10 mg total) by mouth 2 (two) times daily as needed for muscle spasms. 10/20/11   Dorthula Matas, PA  hydrOXYzine (ATARAX/VISTARIL) 25 MG tablet Take 0.5-1 tablets (12.5-25 mg total) by mouth every 8 (eight) hours as needed for itching. 09/19/11   Javonta Gronau S Malcome Ambrocio, PA-C  zolpidem (AMBIEN) 10 MG tablet Take 1-1.5 tablets (10-15 mg total) by mouth at bedtime as needed for  sleep. may fill on/after 10/23/2011 08/23/11 09/22/11  Saralynn Langhorst Tessa Lerner, PA-C    Allergies  Allergen Reactions  . Codeine Nausea And Vomiting  . Morphine And Related Nausea And Vomiting    History   Social History  . Marital Status: Divorced    Spouse Name: N/A    Number of Children: 2  . Years of Education: 14   Occupational History  . office administrator     [employer]   Social History Main Topics  . Smoking status: Former Smoker -- 1.0 packs/day for 17 years    Types: Cigarettes    Quit date: 06/09/2010  . Smokeless tobacco: Never Used  . Alcohol Use: Yes     rare  . Drug Use: No  . Sexually Active: Yes -- Female  partner(s)    Birth Control/ Protection: Surgical     total hysterectomy   Other Topics Concern  . Not on file   Social History Narrative   Son (age 45) living with Dad in [place]. No contact, but close to being able to speak to him by phone.Daughter has had significant issues with substance abuse and prostitution.  Is pregnant, and son is due 12/2011. Living with the patient now.    Family History  Problem Relation Age of Onset  . Osteoporosis Mother   . Stroke Father   . Drug abuse Daughter        Objective:   Physical Exam Blood pressure 113/73, pulse 72, temperature 98.1 F (36.7 C), temperature source Oral, resp. rate 16, height 5' 2.25" (1.581 m), weight 164 lb (74.39 kg), SpO2 98.00%. Body mass index is 29.76 kg/(m^2). Well-developed, well nourished WF who is awake, alert and oriented, in NAD. HEENT: [place]/AT, sclera and conjunctiva are clear.   Neck: supple, non-tender, no lymphadenopathy, thyromegaly. Heart: RRR, no murmur Lungs: normal effort, CTA Chest: Moderate ecchymosis and hematoma of the RIGHT mid breast.  Very tender. Abdomen: normo-active bowel sounds, supple, non-tender, no mass or organomegaly. Extremities: no cyanosis, clubbing or edema. Skin: warm and dry without rash. Psychologic: good mood and appropriate affect, normal speech and behavior.  RIGHT Ribs: [facility] reading (PRIMARY) by  Dr. Neva Seat.  No effusion, pneumothorax, rib fractures.  Normal chest, ribs.     Assessment & Plan:   1. Contusion of leg    2. Contusion, chest wall  DG Ribs Unilateral W/Chest Right  3. Contusion, arm, upper    4. ADHD (attention deficit hyperactivity disorder)  amphetamine-dextroamphetamine (ADDERALL XR) 30 MG 24 hr capsule, amphetamine-dextroamphetamine (ADDERALL XR) 30 MG 24 hr capsule, amphetamine-dextroamphetamine (ADDERALL XR) 30 MG 24 hr capsule  5. Anxiety and depression  ALPRAZolam (XANAX) 1 MG tablet, ALPRAZolam (XANAX) 1 MG tablet, ALPRAZolam (XANAX) 1 MG tablet,  DISCONTINUED: ALPRAZolam (XANAX) 1 MG tablet, DISCONTINUED: ALPRAZolam (XANAX) 1 MG tablet  6. Insomnia     Anticipatory guidance.  Heat.  Rest. RTC February 2014.

## 2011-10-30 NOTE — Progress Notes (Signed)
Xray read and patient discussed with Chelle Jeffery, PA-C. . Agree with assessment and plan of care per her note.   

## 2011-12-12 ENCOUNTER — Telehealth: Payer: Self-pay

## 2011-12-12 NOTE — Telephone Encounter (Signed)
PT OF CHELLE - WANTS TO KNOW IF SHE CAN PLEASE CALL HER IN A REFILL OF THE METRONIDAZOLE.  SAYS CHELLE HAS CALLED THIS REFILL IN FOR HER BEFORE.   SAYS SHE CAN'T COME IN ANY TIME SOON DUE TO WORK.  PLEASE CALL [phone] (WORK)

## 2011-12-12 NOTE — Telephone Encounter (Signed)
What are her  symptoms?  

## 2011-12-13 NOTE — Telephone Encounter (Signed)
Unfortunately, we cannot refill this, especially if she believes that she has trichomonas. She will have to arrange a time to return for evaluation.

## 2011-12-13 NOTE — Telephone Encounter (Signed)
States is same as previous, she has a "trich" infection. Uses [facility]. States is "bad" she can not come in has new job, and her daughter is having a baby.

## 2011-12-14 ENCOUNTER — Ambulatory Visit (INDEPENDENT_AMBULATORY_CARE_PROVIDER_SITE_OTHER): Payer: BC Managed Care – PPO | Admitting: Physician Assistant

## 2011-12-14 ENCOUNTER — Encounter: Payer: Self-pay | Admitting: Physician Assistant

## 2011-12-14 VITALS — BP 110/60 | HR 87 | Temp 98.3°F | Resp 16 | Ht 62.0 in | Wt 159.8 lb

## 2011-12-14 DIAGNOSIS — Z2089 Contact with and (suspected) exposure to other communicable diseases: Secondary | ICD-10-CM

## 2011-12-14 DIAGNOSIS — Z7251 High risk heterosexual behavior: Secondary | ICD-10-CM

## 2011-12-14 DIAGNOSIS — Z202 Contact with and (suspected) exposure to infections with a predominantly sexual mode of transmission: Secondary | ICD-10-CM

## 2011-12-14 DIAGNOSIS — B009 Herpesviral infection, unspecified: Secondary | ICD-10-CM

## 2011-12-14 DIAGNOSIS — N898 Other specified noninflammatory disorders of vagina: Secondary | ICD-10-CM

## 2011-12-14 LAB — POCT WET PREP WITH KOH
KOH Prep POC: NEGATIVE
Trichomonas, UA: NEGATIVE
Yeast Wet Prep HPF POC: NEGATIVE

## 2011-12-14 MED ORDER — ACYCLOVIR 5 % EX CREA: 1.0000 "application " | TOPICAL_CREAM | CUTANEOUS | Status: DC

## 2011-12-14 MED ORDER — FLUCONAZOLE 150 MG PO TABS: 150.0000 mg | ORAL_TABLET | Freq: Once | ORAL | Status: DC

## 2011-12-14 MED ORDER — AZITHROMYCIN 500 MG PO TABS: 500.0000 mg | ORAL_TABLET | Freq: Every day | ORAL | Status: DC

## 2011-12-14 MED ORDER — VALACYCLOVIR HCL 1 G PO TABS: 1000.0000 mg | ORAL_TABLET | Freq: Every day | ORAL | Status: DC

## 2011-12-14 MED ORDER — METRONIDAZOLE 500 MG PO TABS: 500.0000 mg | ORAL_TABLET | Freq: Two times a day (BID) | ORAL | Status: DC

## 2011-12-14 MED ORDER — CEFAZOLIN SODIUM 1 G IJ SOLR
1.0000 g | Freq: Once | INTRAMUSCULAR | Status: AC
  Administered 2011-12-14: 1 g via INTRAMUSCULAR
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
## 2011-12-14 NOTE — Telephone Encounter (Signed)
Spoke with pt advised to RTC. Pt understood

## 2011-12-15 LAB — HEPATITIS B SURFACE ANTIBODY, QUANTITATIVE: Hep B S AB Quant (Post): 0.1 m[IU]/mL

## 2011-12-15 LAB — RPR

## 2011-12-15 LAB — HEPATITIS B SURFACE ANTIGEN: Hepatitis B Surface Ag: NEGATIVE

## 2011-12-15 LAB — HIV ANTIBODY (ROUTINE TESTING W REFLEX): HIV: NONREACTIVE

## 2011-12-15 LAB — HEPATITIS C ANTIBODY: HCV Ab: NEGATIVE

## 2012-01-03 ENCOUNTER — Ambulatory Visit: Payer: BC Managed Care – PPO | Admitting: Physician Assistant

## 2012-01-10 ENCOUNTER — Ambulatory Visit (INDEPENDENT_AMBULATORY_CARE_PROVIDER_SITE_OTHER): Payer: BC Managed Care – PPO | Admitting: Physician Assistant

## 2012-01-10 ENCOUNTER — Encounter: Payer: Self-pay | Admitting: Physician Assistant

## 2012-01-10 VITALS — BP 125/82 | HR 87 | Temp 97.7°F | Resp 16 | Ht 62.0 in | Wt 156.0 lb

## 2012-01-10 DIAGNOSIS — M25559 Pain in unspecified hip: Secondary | ICD-10-CM

## 2012-01-10 DIAGNOSIS — F329 Major depressive disorder, single episode, unspecified: Secondary | ICD-10-CM

## 2012-01-10 DIAGNOSIS — F341 Dysthymic disorder: Secondary | ICD-10-CM

## 2012-01-10 DIAGNOSIS — M5430 Sciatica, unspecified side: Secondary | ICD-10-CM

## 2012-01-10 MED ORDER — MELOXICAM 15 MG PO TABS: 15.0000 mg | ORAL_TABLET | Freq: Every day | ORAL | Status: DC

## 2012-01-10 MED ORDER — BUPROPION HCL ER (XL) 150 MG PO TB24: 150.0000 mg | ORAL_TABLET | Freq: Every day | ORAL | Status: DC

## 2012-01-10 NOTE — Patient Instructions (Signed)
If your hip and thigh pain doesn't resolve, return for additional evaluation and treatment.  If the Wellbutrin isn't helpful, or if you have side effects.

## 2012-01-11 ENCOUNTER — Encounter: Payer: Self-pay | Admitting: Physician Assistant

## 2012-01-11 NOTE — Progress Notes (Signed)
Subjective:    Patient ID: Diana Benson, female    DOB: [birth year], 47 y.o.   MRN: [id number]  HPI This 47 y.o. female presents for evaluation of RIGHT hip and leg pain x several months.  Progressively worsening, especially when she crosses the RIGHT leg over the left.  No pain in her low back.  The pain extends to the knee.  Did not tolerated Flexeril.  Also notes that effexor is causing GI upset and has stopped it.  No identified trauma or injury.  No loss of bowel or bladder control.  Lots of increased stress.   Past Medical History  Diagnosis Date  . COPD (chronic obstructive pulmonary disease) with chronic bronchitis   . CVA (cerebral vascular accident)   . GERD (gastroesophageal reflux disease)   . S/P appy   . History of gastric bypass   . Hx of hysterectomy, total   . Asthma   . ADHD (attention deficit hyperactivity disorder)   . Anxiety   . Depression   . Fibromyalgia     Past Surgical History  Procedure Date  . Appendectomy   . Abdominal hysterectomy   . Gastric bypass     Prior to Admission medications   Medication Sig Start Date End Date Taking? Authorizing Provider  ALPRAZolam Prudy Feeler) 1 MG tablet Take 1 tablet (1 mg total) by mouth 3 (three) times daily as needed. 10/25/11  Yes Berlie Hatchel S Lovelle Lema, PA-C  ALPRAZolam (XANAX) 1 MG tablet Take 1 tablet (1 mg total) by mouth 3 (three) times daily as needed for anxiety. May fill on/after 11/25/2011. 10/25/11  Yes Judea Fennimore S Natavia Sublette, PA-C  ALPRAZolam (XANAX) 1 MG tablet Take 1 tablet (1 mg total) by mouth 3 (three) times daily as needed for anxiety. May fill on/after 12/24/2011. 10/25/11  Yes Alianys Chacko S Bluford Sedler, PA-C  amphetamine-dextroamphetamine (ADDERALL XR) 30 MG 24 hr capsule 2 tablets qam and 1 q evening. BRAND MEDICALLY NECESSARY. May fill on/after 11/23/2011. 10/25/11  Yes Aireonna Bauer S Mckinzie Saksa, PA-C  esomeprazole (NEXIUM) 40 MG capsule Take 1 capsule (40 mg total) by mouth 2 (two) times daily. 08/23/11  Yes Lilliana Turner S Shawnee Gambone, PA-C   estrogen-methylTESTOSTERone 0.625-1.25 MG per tablet Take 1 tablet by mouth daily. 08/23/11  Yes Lovina Zuver S Brittani Purdum, PA-C  fluticasone (FLONASE) 50 MCG/ACT nasal spray Place 2 sprays into the nose daily. 08/23/11 08/22/12 Yes Dee Maday S Jimi Giza, PA-C  Fluticasone-Salmeterol (ADVAIR) 250-50 MCG/DOSE AEPB Inhale 1 puff into the lungs every 12 (twelve) hours. 08/23/11  Yes Sharen Youngren S Merrie Epler, PA-C  hydrOXYzine (ATARAX/VISTARIL) 25 MG tablet Take 0.5-1 tablets (12.5-25 mg total) by mouth every 8 (eight) hours as needed for itching. 09/19/11  Yes Shawan Corella S Jerime Arif, PA-C  montelukast (SINGULAIR) 10 MG tablet Take 1 tablet (10 mg total) by mouth 1 day or 1 dose. 08/23/11  Yes Veronda Gabor S Lenis Nettleton, PA-C  zolpidem (AMBIEN) 10 MG tablet Take 1-1.5 tablets (10-15 mg total) by mouth at bedtime as needed. 08/23/11  Yes Zaina Jenkin S Brayden Brodhead, PA-C  acyclovir cream (ZOVIRAX) 5 % Apply 1 application topically every 3 (three) hours. 12/14/11   Heather Jaquita Rector, PA-C  albuterol (PROAIR HFA) 108 (90 BASE) MCG/ACT inhaler Inhale 2 puffs into the lungs every 4 (four) hours as needed for wheezing or shortness of breath (cough). 08/23/11   Mikaya Bunner S Kamin Niblack, PA-C  albuterol (PROVENTIL) (5 MG/ML) 0.5% nebulizer solution Take 2.5 mg by nebulization every 6 (six) hours as needed.    Historical Provider, MD  valACYclovir (VALTREX) 1000 MG tablet Take 1  tablet (1,000 mg total) by mouth daily. 12/14/11   Heather Jaquita Rector, PA-C  zolpidem (AMBIEN) 10 MG tablet Take 1-1.5 tablets (10-15 mg total) by mouth at bedtime as needed for sleep. may fill on/after 10/23/2011 08/23/11 09/22/11  Calden Dorsey Tessa Lerner, PA-C    Allergies  Allergen Reactions  . Codeine Nausea And Vomiting  . Flexeril (Cyclobenzaprine) Other (See Comments)    "Makes me feel like I'm coming out of my skin."  . Morphine And Related Nausea And Vomiting  . Venlafaxine Nausea Only    History   Social History  . Marital Status: Divorced    Spouse Name: N/A    Number of Children: 2  . Years  of Education: 14   Occupational History  . Inventory Teacher, English as a foreign language for [employer]     [employer]   Social History Main Topics  . Smoking status: Former Smoker -- 1.0 packs/day for 17 years    Types: Cigarettes    Quit date: 06/09/2010  . Smokeless tobacco: Never Used  . Alcohol Use: Yes     Comment: rare  . Drug Use: No  . Sexually Active: Yes -- Female partner(s)    Birth Control/ Protection: Surgical     Comment: total hysterectomy   Other Topics Concern  . Not on file   Social History Narrative   Son (age 66) living with Dad in [place]. Almost no contact, despite her efforts, for several years. He has recently contacted her, wanting to move in with her when he turns 17.  Struggling with behavior, alcohol, etc., and realizing that his father has not been entirely honest with him about the patient.Daughter has had significant issues with substance abuse and prostitution.  Son born 12/18/2011. Living with the patient now, but is likely to lose custody of him to the state. The patient is unable to step in as guardian given the likelihood of her troubled son moving in with her in the near future.    Family History  Problem Relation Age of Onset  . Osteoporosis Mother   . Hypertension Mother   . Stroke Father   . Drug abuse Daughter    Review of Systems As above.    Objective:   Physical Exam  Blood pressure 125/82, pulse 87, temperature 97.7 F (36.5 C), temperature source Oral, resp. rate 16, height 5\' 2"  (1.575 m), weight 156 lb (70.761 kg). Body mass index is 28.53 kg/(m^2). Well-developed, well nourished WF who is awake, alert and oriented, in NAD. HEENT: [place]/AT, sclera and conjunctiva are clear.   Neck: supple, non-tender, no lymphadenopathy, thyromegaly. Heart: RRR, no murmur Lungs: normal effort, CTA Extremities: no cyanosis, clubbing or edema. Exquisite tenderness of the RIGHT SI joint and trochanteric bursa.  Normal strength, sensation, ROM.  No tenderness  of the lumbago.  Normal reflexes.  She refuses radiographic studies today, stating that she has to return to work. Skin: warm and dry without rash. Psychologic: dysphoric, anxious mood and appropriate affect, normal speech and behavior.     Assessment & Plan:   1. Hip pain  meloxicam (MOBIC) 15 MG tablet  2. Sciatica without back pain  meloxicam (MOBIC) 15 MG tablet  3. Anxiety and depression  buPROPion (WELLBUTRIN XL) 150 MG 24 hr tablet   She has stopped the Effexor.  Trial of Wellbutrin.  She doesn't want anything that will cause her to gain weight.  Trial of Meloxicam.  If hip pain continues, re-eval with xray.  Consider bursa injection.

## 2012-01-21 ENCOUNTER — Other Ambulatory Visit: Payer: Self-pay | Admitting: Physician Assistant

## 2012-02-10 ENCOUNTER — Other Ambulatory Visit: Payer: Self-pay | Admitting: Physician Assistant

## 2012-05-23 ENCOUNTER — Ambulatory Visit
Admission: RE | Admit: 2012-05-23 | Discharge: 2012-05-23 | Disposition: A | Discharge status: Home / Self Care | Payer: BC Managed Care – PPO | Source: Ambulatory Visit | Attending: Physician Assistant | Admitting: Physician Assistant

## 2012-05-23 ENCOUNTER — Telehealth: Payer: Self-pay | Admitting: Radiology

## 2012-05-23 ENCOUNTER — Ambulatory Visit (INDEPENDENT_AMBULATORY_CARE_PROVIDER_SITE_OTHER): Payer: BC Managed Care – PPO | Admitting: Physician Assistant

## 2012-05-23 VITALS — BP 124/76 | HR 85 | Temp 98.7°F | Resp 17 | Ht 63.0 in | Wt 161.0 lb

## 2012-05-23 DIAGNOSIS — F341 Dysthymic disorder: Secondary | ICD-10-CM

## 2012-05-23 DIAGNOSIS — M5430 Sciatica, unspecified side: Secondary | ICD-10-CM

## 2012-05-23 DIAGNOSIS — R51 Headache: Secondary | ICD-10-CM

## 2012-05-23 DIAGNOSIS — G4700 Insomnia, unspecified: Secondary | ICD-10-CM

## 2012-05-23 DIAGNOSIS — F32A Depression, unspecified: Secondary | ICD-10-CM

## 2012-05-23 DIAGNOSIS — M25551 Pain in right hip: Secondary | ICD-10-CM

## 2012-05-23 DIAGNOSIS — Z79890 Hormone replacement therapy (postmenopausal): Secondary | ICD-10-CM

## 2012-05-23 DIAGNOSIS — J45909 Unspecified asthma, uncomplicated: Secondary | ICD-10-CM

## 2012-05-23 DIAGNOSIS — K219 Gastro-esophageal reflux disease without esophagitis: Secondary | ICD-10-CM

## 2012-05-23 DIAGNOSIS — M25559 Pain in unspecified hip: Secondary | ICD-10-CM

## 2012-05-23 DIAGNOSIS — F419 Anxiety disorder, unspecified: Secondary | ICD-10-CM

## 2012-05-23 DIAGNOSIS — B009 Herpesviral infection, unspecified: Secondary | ICD-10-CM

## 2012-05-23 DIAGNOSIS — F909 Attention-deficit hyperactivity disorder, unspecified type: Secondary | ICD-10-CM

## 2012-05-23 DIAGNOSIS — J309 Allergic rhinitis, unspecified: Secondary | ICD-10-CM

## 2012-05-23 MED ORDER — DULOXETINE HCL 60 MG PO CPEP: 60.0000 mg | ORAL_CAPSULE | Freq: Every day | ORAL | Status: DC

## 2012-05-23 MED ORDER — ALPRAZOLAM 1 MG PO TABS: 1.0000 mg | ORAL_TABLET | Freq: Three times a day (TID) | ORAL | Status: DC | PRN

## 2012-05-23 MED ORDER — AMPHETAMINE-DEXTROAMPHET ER 30 MG PO CP24: ORAL_CAPSULE | ORAL | Status: DC

## 2012-05-23 MED ORDER — ZOLPIDEM TARTRATE 10 MG PO TABS: 10.0000 mg | ORAL_TABLET | Freq: Every evening | ORAL | Status: DC | PRN

## 2012-05-23 MED ORDER — HYDROXYZINE HCL 25 MG PO TABS: 12.5000 mg | ORAL_TABLET | Freq: Three times a day (TID) | ORAL | Status: DC | PRN

## 2012-05-23 MED ORDER — ESOMEPRAZOLE MAGNESIUM 40 MG PO CPDR: 40.0000 mg | DELAYED_RELEASE_CAPSULE | Freq: Two times a day (BID) | ORAL | Status: DC

## 2012-05-23 MED ORDER — FLUTICASONE-SALMETEROL 250-50 MCG/DOSE IN AEPB: 1.0000 | INHALATION_SPRAY | Freq: Two times a day (BID) | RESPIRATORY_TRACT | Status: DC

## 2012-05-23 MED ORDER — VALACYCLOVIR HCL 1 G PO TABS: 1000.0000 mg | ORAL_TABLET | Freq: Every day | ORAL | Status: DC

## 2012-05-23 MED ORDER — MELOXICAM 15 MG PO TABS: 15.0000 mg | ORAL_TABLET | Freq: Every day | ORAL | Status: DC

## 2012-05-23 MED ORDER — EST ESTROGENS-METHYLTEST 0.625-1.25 MG PO TABS: 1.0000 | ORAL_TABLET | Freq: Every day | ORAL | Status: DC

## 2012-05-23 MED ORDER — ACYCLOVIR 5 % EX CREA: 1.0000 "application " | TOPICAL_CREAM | CUTANEOUS | Status: DC

## 2012-05-23 MED ORDER — ALBUTEROL SULFATE HFA 108 (90 BASE) MCG/ACT IN AERS: 2.0000 | INHALATION_SPRAY | RESPIRATORY_TRACT | Status: DC | PRN

## 2012-05-23 MED ORDER — MONTELUKAST SODIUM 10 MG PO TABS: 10.0000 mg | ORAL_TABLET | ORAL | Status: DC

## 2012-05-23 MED ORDER — FLUTICASONE PROPIONATE 50 MCG/ACT NA SUSP: 2.0000 | Freq: Every day | NASAL | Status: DC

## 2012-05-23 MED ORDER — ALBUTEROL SULFATE (5 MG/ML) 0.5% IN NEBU: 2.5000 mg | INHALATION_SOLUTION | Freq: Four times a day (QID) | RESPIRATORY_TRACT | Status: DC | PRN

## 2012-05-23 MED ORDER — BUTALBITAL-ASA-CAFFEINE 50-325-40 MG PO CAPS: 1.0000 | ORAL_CAPSULE | Freq: Four times a day (QID) | ORAL | Status: DC | PRN

## 2012-05-23 NOTE — Telephone Encounter (Signed)
Called pharmacy back about Cymbalta Rx, they will send [place] a prior auth request, she has tried and failed SSRI, pull the paper chart for list of the ones she has tried and failed.

## 2012-05-23 NOTE — Telephone Encounter (Signed)
CT scan negative for intracranial process. Patient notified and advised to pick up Rx at pharmacy.  Please call in: Meds ordered this encounter  Medications  . butalbital-aspirin-caffeine (FIORINAL) 50-325-40 MG per capsule    Sig: Take 1 capsule by mouth every 6 (six) hours as needed for headache.    Dispense:  10 capsule    Refill:  0    Order Specific Question:  Supervising Provider    Answer:  DOOLITTLE, ROBERT P [[id number]]
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
## 2012-05-23 NOTE — Patient Instructions (Addendum)
Go to now for the scan.  Please wait there until you hear from me. You go to [employer] company has authorized the scan  Driving directions to [address] [place], [place], [place] [address] [id number]  - more info    [address]  [place], [place] [address]     1. Head south on [address] toward [address]      0.5 mi    2. Sharp left onto [address]      0.6 mi    3. Turn left onto the [address] ramp      0.2 mi    4. Merge onto [address]      3.0 mi    5. Continue straight to stay on [address]      0.4 mi    6. Slight left to stay on [address]  Destination will be on the right     1.0 mi     [address] [place], [place] [address]

## 2012-05-23 NOTE — Telephone Encounter (Signed)
Called in the Fioricet

## 2012-05-28 ENCOUNTER — Other Ambulatory Visit: Payer: Self-pay

## 2012-05-28 DIAGNOSIS — F329 Major depressive disorder, single episode, unspecified: Secondary | ICD-10-CM

## 2012-05-28 MED ORDER — DULOXETINE HCL 60 MG PO CPEP: 60.0000 mg | ORAL_CAPSULE | Freq: Two times a day (BID) | ORAL | Status: DC

## 2012-05-28 NOTE — Progress Notes (Signed)
PA approved for duloxetine 60 mg BID indefinitely. Faxed to pharmacy.

## 2012-09-18 ENCOUNTER — Ambulatory Visit (INDEPENDENT_AMBULATORY_CARE_PROVIDER_SITE_OTHER): Payer: BC Managed Care – PPO | Admitting: Physician Assistant

## 2012-09-18 ENCOUNTER — Encounter: Payer: Self-pay | Admitting: Physician Assistant

## 2012-09-18 VITALS — BP 105/65 | HR 75 | Temp 98.2°F | Resp 16 | Ht 62.0 in | Wt 149.4 lb

## 2012-09-18 DIAGNOSIS — K219 Gastro-esophageal reflux disease without esophagitis: Secondary | ICD-10-CM

## 2012-09-18 DIAGNOSIS — M5430 Sciatica, unspecified side: Secondary | ICD-10-CM

## 2012-09-18 DIAGNOSIS — J309 Allergic rhinitis, unspecified: Secondary | ICD-10-CM

## 2012-09-18 DIAGNOSIS — R51 Headache: Secondary | ICD-10-CM

## 2012-09-18 DIAGNOSIS — F909 Attention-deficit hyperactivity disorder, unspecified type: Secondary | ICD-10-CM

## 2012-09-18 DIAGNOSIS — R238 Other skin changes: Secondary | ICD-10-CM

## 2012-09-18 DIAGNOSIS — Z23 Encounter for immunization: Secondary | ICD-10-CM

## 2012-09-18 DIAGNOSIS — IMO0001 Reserved for inherently not codable concepts without codable children: Secondary | ICD-10-CM

## 2012-09-18 DIAGNOSIS — J4550 Severe persistent asthma, uncomplicated: Secondary | ICD-10-CM

## 2012-09-18 DIAGNOSIS — F341 Dysthymic disorder: Secondary | ICD-10-CM

## 2012-09-18 DIAGNOSIS — B009 Herpesviral infection, unspecified: Secondary | ICD-10-CM

## 2012-09-18 DIAGNOSIS — G4700 Insomnia, unspecified: Secondary | ICD-10-CM

## 2012-09-18 DIAGNOSIS — M797 Fibromyalgia: Secondary | ICD-10-CM

## 2012-09-18 DIAGNOSIS — M25551 Pain in right hip: Secondary | ICD-10-CM

## 2012-09-18 DIAGNOSIS — J45909 Unspecified asthma, uncomplicated: Secondary | ICD-10-CM

## 2012-09-18 DIAGNOSIS — F329 Major depressive disorder, single episode, unspecified: Secondary | ICD-10-CM

## 2012-09-18 LAB — CBC WITH DIFFERENTIAL/PLATELET
Basophils Absolute: 0.1 10*3/uL (ref 0.0–0.1)
Basophils Relative: 1 % (ref 0–1)
Eosinophils Absolute: 0.2 10*3/uL (ref 0.0–0.7)
Eosinophils Relative: 4 % (ref 0–5)
HCT: 38.9 % (ref 36.0–46.0)
MCH: 30.3 pg (ref 26.0–34.0)
MCHC: 33.4 g/dL (ref 30.0–36.0)
MCV: 90.7 fL (ref 78.0–100.0)
Monocytes Absolute: 0.5 10*3/uL (ref 0.1–1.0)
Platelets: 298 10*3/uL (ref 150–400)
RDW: 13.3 % (ref 11.5–15.5)
WBC: 5.4 10*3/uL (ref 4.0–10.5)

## 2012-09-18 LAB — PULMONARY FUNCTION TEST

## 2012-09-18 MED ORDER — DULOXETINE HCL 60 MG PO CPEP: 60.0000 mg | ORAL_CAPSULE | Freq: Two times a day (BID) | ORAL | Status: DC

## 2012-09-18 MED ORDER — HYDROXYZINE HCL 25 MG PO TABS: 12.5000 mg | ORAL_TABLET | Freq: Three times a day (TID) | ORAL | Status: DC | PRN

## 2012-09-18 MED ORDER — ZOLPIDEM TARTRATE 10 MG PO TABS: 10.0000 mg | ORAL_TABLET | Freq: Every evening | ORAL | Status: DC | PRN

## 2012-09-18 MED ORDER — AMPHETAMINE-DEXTROAMPHET ER 30 MG PO CP24: ORAL_CAPSULE | ORAL | Status: DC

## 2012-09-18 MED ORDER — VALACYCLOVIR HCL 1 G PO TABS: 1000.0000 mg | ORAL_TABLET | Freq: Every day | ORAL | Status: DC

## 2012-09-18 MED ORDER — ALPRAZOLAM 1 MG PO TABS: 1.0000 mg | ORAL_TABLET | Freq: Three times a day (TID) | ORAL | Status: DC | PRN

## 2012-09-18 MED ORDER — ALBUTEROL SULFATE HFA 108 (90 BASE) MCG/ACT IN AERS: 2.0000 | INHALATION_SPRAY | RESPIRATORY_TRACT | Status: DC | PRN

## 2012-09-18 MED ORDER — MELOXICAM 15 MG PO TABS: 15.0000 mg | ORAL_TABLET | Freq: Every day | ORAL | Status: DC

## 2012-09-18 MED ORDER — FLUTICASONE-SALMETEROL 500-50 MCG/DOSE IN AEPB: 1.0000 | INHALATION_SPRAY | Freq: Two times a day (BID) | RESPIRATORY_TRACT | Status: DC

## 2012-09-18 MED ORDER — ALBUTEROL SULFATE (5 MG/ML) 0.5% IN NEBU: 2.5000 mg | INHALATION_SOLUTION | Freq: Four times a day (QID) | RESPIRATORY_TRACT | Status: DC | PRN

## 2012-09-18 MED ORDER — ESOMEPRAZOLE MAGNESIUM 40 MG PO CPDR: 40.0000 mg | DELAYED_RELEASE_CAPSULE | Freq: Two times a day (BID) | ORAL | Status: DC

## 2012-09-18 MED ORDER — BUTALBITAL-ASA-CAFFEINE 50-325-40 MG PO CAPS: 1.0000 | ORAL_CAPSULE | Freq: Four times a day (QID) | ORAL | Status: DC | PRN

## 2012-09-18 NOTE — Progress Notes (Signed)
Subjective:    Patient ID: Diana Benson, female    DOB: 1965/09/02, 47 y.o.   MRN: [id number]  HPI  This 47 y.o. female presents for prescription refills.  "I need refills of everything."  Chart reviewed with her and most prescriptions do need refilling.  Patient Active Problem List   Diagnosis Date Noted  . Asthma 01/19/2011  . Anxiety and depression 01/19/2011  . ADHD (attention deficit hyperactivity disorder) 01/19/2011  . Stroke 01/19/2011  . Fibromyalgia 01/19/2011   Medications, allergies, past medical history, surgical history, family history, social history and problem list reviewed.  Review of Systems She's having increased bruising for the past few months.  "barely" bumps into something and gets a bruise that lasts longer than usual.  Also notes that the blood from mosquito bites on her legs seems darker than it should.  No hematuria, vaginal bleeding, melena or hematochezia.  No bleeding of the gums. Headaches occur once every month or two, she thinks.  Stress is a trigger, and she has a lot of stress now. Using the rescue inhaler and nebs more frequently, living with her mother.  The home is older, with lots of dust, and multiple cats, to which the patient is allergic.  Thinks she uses albuterol almost every day, sometimes more than once.    Objective:   Physical Exam Blood pressure 105/65, pulse 75, temperature 98.2 F (36.8 C), temperature source Oral, resp. rate 16, height 5\' 2"  (1.575 m), weight 149 lb 6.4 oz (67.767 kg), SpO2 98.00%. Body mass index is 27.32 kg/(m^2). Well-developed, well nourished WF who is awake, alert and oriented, in NAD. HEENT: [place]/AT, sclera and conjunctiva are clear.   Neck: supple, non-tender, no lymphadenopathy, thyromegaly. Heart: RRR, no murmur Lungs: normal effort, CTA Extremities: no cyanosis, clubbing or edema. Skin: warm and dry without rash. Psychologic: good mood and appropriate affect, normal speech and behavior.         Assessment & Plan:  ADHD (attention deficit hyperactivity disorder) - Plan: amphetamine-dextroamphetamine (ADDERALL XR) 30 MG 24 hr capsule, amphetamine-dextroamphetamine (ADDERALL XR) 30 MG 24 hr capsule, amphetamine-dextroamphetamine (ADDERALL XR) 30 MG 24 hr capsule  Anxiety and depression - Plan: ALPRAZolam (XANAX) 1 MG tablet, ALPRAZolam (XANAX) 1 MG tablet, ALPRAZolam (XANAX) 1 MG tablet, DULoxetine (CYMBALTA) 60 MG capsule  Easy bruising - Plan: CBC with Differential  Asthma - Plan: albuterol (PROAIR HFA) 108 (90 BASE) MCG/ACT inhaler, albuterol (PROVENTIL) (5 MG/ML) 0.5% nebulizer solution, INCREASE Fluticasone-Salmeterol (ADVAIR) 500-50 MCG/DOSE AEPB; Spirometry with graph  Headache(784.0) - Plan: butalbital-aspirin-caffeine (FIORINAL) 50-325-40 MG per capsule; encouraged her to keep a HA log.  GERD (gastroesophageal reflux disease) - Plan: esomeprazole (NEXIUM) 40 MG capsule  AR (allergic rhinitis) - Plan: hydrOXYzine (ATARAX/VISTARIL) 25 MG tablet  Hip pain, right - Plan: meloxicam (MOBIC) 15 MG tablet  Sciatica without back pain, unspecified laterality - Plan: meloxicam (MOBIC) 15 MG tablet  Herpes - Plan: valACYclovir (VALTREX) 1000 MG tablet  Insomnia - Plan: zolpidem (AMBIEN) 10 MG tablet, zolpidem (AMBIEN) 10 MG tablet, zolpidem (AMBIEN) 10 MG tablet  Need for prophylactic vaccination and inoculation against influenza - Plan: Flu Vaccine QUAD 36+ mos IM  Need for prophylactic vaccination against Streptococcus pneumoniae (pneumococcus) - Plan: Pneumococcal polysaccharide vaccine 23-valent greater than or equal to 2yo subcutaneous/IM  Need for Tdap vaccination - Plan: Tdap vaccine greater than or equal to 7yo IM    RTC 3 months, sooner if needed.    Fernande Bras, PA-C Physician Assistant-Certified [facility]  [place]

## 2012-09-18 NOTE — Patient Instructions (Signed)
I will contact you with your lab results as soon as they are available.   If you have not heard from me in 2 weeks, please contact me.  The fastest way to get your results is to register for My Chart (see the instructions on the last page of this printout).   

## 2012-10-23 ENCOUNTER — Encounter: Payer: Self-pay | Admitting: Physician Assistant

## 2012-11-03 ENCOUNTER — Ambulatory Visit: Payer: BC Managed Care – PPO

## 2012-11-03 ENCOUNTER — Ambulatory Visit (INDEPENDENT_AMBULATORY_CARE_PROVIDER_SITE_OTHER): Payer: BC Managed Care – PPO | Admitting: Emergency Medicine

## 2012-11-03 VITALS — BP 112/70 | HR 91 | Temp 98.1°F | Resp 16 | Ht 62.5 in | Wt 145.0 lb

## 2012-11-03 DIAGNOSIS — R0602 Shortness of breath: Secondary | ICD-10-CM

## 2012-11-03 DIAGNOSIS — J309 Allergic rhinitis, unspecified: Secondary | ICD-10-CM

## 2012-11-03 DIAGNOSIS — R05 Cough: Secondary | ICD-10-CM

## 2012-11-03 DIAGNOSIS — R062 Wheezing: Secondary | ICD-10-CM

## 2012-11-03 LAB — POCT CBC
HCT, POC: 41.2 % (ref 37.7–47.9)
Hemoglobin: 13.5 g/dL (ref 12.2–16.2)
Lymph, poc: 2.3 (ref 0.6–3.4)
MCH, POC: 31.4 pg — AB (ref 27–31.2)
MCHC: 32.8 g/dL (ref 31.8–35.4)
MPV: 8.2 fL (ref 0–99.8)
POC Granulocyte: 4.5 (ref 2–6.9)
POC LYMPH PERCENT: 31.7 %L (ref 10–50)
POC MID %: 6.9 %M (ref 0–12)
RDW, POC: 14.3 %
WBC: 7.3 10*3/uL (ref 4.6–10.2)

## 2012-11-03 MED ORDER — PROMETHAZINE HCL 25 MG RE SUPP: 25.0000 mg | Freq: Four times a day (QID) | RECTAL | Status: DC | PRN

## 2012-11-03 MED ORDER — AZITHROMYCIN 500 MG PO TABS: 500.0000 mg | ORAL_TABLET | Freq: Every day | ORAL | Status: DC

## 2012-11-03 MED ORDER — PREDNISONE 20 MG PO TABS: ORAL_TABLET | ORAL | Status: DC

## 2012-11-03 MED ORDER — ALBUTEROL SULFATE (2.5 MG/3ML) 0.083% IN NEBU
5.0000 mg | INHALATION_SOLUTION | Freq: Once | RESPIRATORY_TRACT | Status: AC
  Administered 2012-11-03: 5 mg via RESPIRATORY_TRACT

## 2012-11-03 MED ORDER — BENZONATATE 100 MG PO CAPS: 100.0000 mg | ORAL_CAPSULE | Freq: Three times a day (TID) | ORAL | Status: DC | PRN

## 2012-11-03 MED ORDER — IPRATROPIUM BROMIDE 0.02 % IN SOLN
0.5000 mg | Freq: Once | RESPIRATORY_TRACT | Status: AC
  Administered 2012-11-03: 0.5 mg via RESPIRATORY_TRACT

## 2012-11-03 NOTE — Patient Instructions (Signed)
Get plenty of rest and drink at least 64 ounces of water daily. Use the inhaler or nebulizer treatments every 4 hours as needed for shortness of breath, cough or wheezing.  If it's ineffective, return here or go to the emergency department.

## 2012-11-03 NOTE — Progress Notes (Signed)
  Subjective:    Patient ID: Diana Benson, female    DOB: November 01, 1965, 47 y.o.   MRN: [id number]  HPI  This 47 y.o. female presents for evaluation of respiratory illness that began about a week ago.  She describes some nasal congestion, post-nasal drainage and cough, that became dramatically worse on 10/31/2012.  She feels SOB, coughs to the point of gagging/vomiting, and the sensation that she cannot catch her breath.  Her home nebs have been ineffective.  She's been taking acetaminophen for body aches, and has not noted any fever.  No diarrhea.  No urinary symptoms.  Medications, allergies, past medical history, surgical history, family history, social history and problem list reviewed and updated.   Review of Systems As above.    Objective:   Physical Exam Blood pressure 112/70, pulse 91, temperature 98.1 F (36.7 C), temperature source Oral, resp. rate 16, height 5' 2.5" (1.588 m), weight 145 lb (65.772 kg), SpO2 96.00%. Body mass index is 26.08 kg/(m^2). Well-developed, well nourished WF who is awake, alert and oriented, who obviously doesn't feel well, and who appears in moderate distress when coughing (prdouces clear phlegm). HEENT: [place]/AT, PERRL, EOMI.  Sclera and conjunctiva are clear.  EAC are patent, TMs are normal in appearance. Nasal mucosa is pink and moist. OP is clear. Neck: supple, non-tender, no lymphadenopathy, thyromegaly. Heart: RRR, no murmur Lungs: mild distress, moderate to severe with coughing.  Initially, very tight, with significantly decreased air movement and diffuse high-pitched musical wheezes.  After albuterol (x2) + Atrovent neb, wheezes are completely resolved, but subjectively she has minimal improvement. Extremities: no cyanosis, clubbing or edema. Skin: warm and dry without rash. Psychologic: good mood and appropriate affect, normal speech and behavior.    Results for orders placed in visit on 11/03/12  POCT CBC      Result Value Range   WBC 7.3  4.6  - 10.2 K/uL   Lymph, poc 2.3  0.6 - 3.4   POC LYMPH PERCENT 31.7  10 - 50 %L   MID (cbc) 0.5  0 - 0.9   POC MID % 6.9  0 - 12 %M   POC Granulocyte 4.5  2 - 6.9   Granulocyte percent 61.4  37 - 80 %G   RBC 4.30  4.04 - 5.48 M/uL   Hemoglobin 13.5  12.2 - 16.2 g/dL   HCT, POC 11.9  14.7 - 47.9 %   MCV 95.8  80 - 97 fL   MCH, POC 31.4 (*) 27 - 31.2 pg   MCHC 32.8  31.8 - 35.4 g/dL   RDW, POC 82.9     Platelet Count, POC 294  142 - 424 K/uL   MPV 8.2  0 - 99.8 fL   [facility] reading (PRIMARY) by  Dr. Cleta Alberts.  Streaking noted posteriorly.  No infiltrate. Density of uncertain significance just below the RIGHT 8th rib.      Assessment & Plan:  Cough - Plan: POCT CBC, benzonatate (TESSALON) 100 MG capsule, predniSONE (DELTASONE) 20 MG tablet, azithromycin (ZITHROMAX) 500 MG tablet, promethazine (PHENERGAN) 25 MG suppository  SOB (shortness of breath) - Plan: DG Chest 2 View  Wheezing - Plan: albuterol (PROVENTIL) (2.5 MG/3ML) 0.083% nebulizer solution 5 mg, ipratropium (ATROVENT) nebulizer solution 0.5 mg  Abnormal CXR - await over-read.  May need CT to further evaluate possible nodule in the RIGHT mid-lung.  Fernande Bras, PA-C Physician Assistant-Certified [facility]

## 2012-11-07 ENCOUNTER — Other Ambulatory Visit: Payer: Self-pay | Admitting: Radiology

## 2012-11-07 DIAGNOSIS — R9389 Abnormal findings on diagnostic imaging of other specified body structures: Secondary | ICD-10-CM

## 2012-11-13 ENCOUNTER — Ambulatory Visit: Payer: BC Managed Care – PPO

## 2012-11-13 ENCOUNTER — Encounter (HOSPITAL_COMMUNITY): Payer: Self-pay | Admitting: Emergency Medicine

## 2012-11-13 ENCOUNTER — Ambulatory Visit (INDEPENDENT_AMBULATORY_CARE_PROVIDER_SITE_OTHER): Payer: BC Managed Care – PPO | Admitting: Family Medicine

## 2012-11-13 ENCOUNTER — Inpatient Hospital Stay (HOSPITAL_COMMUNITY)
Admission: EM | Admit: 2012-11-13 | Discharge: 2012-11-16 | DRG: 190 | Disposition: A | Discharge status: Home / Self Care | Payer: BC Managed Care – PPO | Attending: Internal Medicine | Admitting: Internal Medicine

## 2012-11-13 VITALS — BP 122/76 | HR 98 | Temp 98.6°F | Resp 17 | Ht 63.0 in | Wt 142.0 lb

## 2012-11-13 DIAGNOSIS — R509 Fever, unspecified: Secondary | ICD-10-CM

## 2012-11-13 DIAGNOSIS — F411 Generalized anxiety disorder: Secondary | ICD-10-CM | POA: Diagnosis present

## 2012-11-13 DIAGNOSIS — F3289 Other specified depressive episodes: Secondary | ICD-10-CM | POA: Diagnosis present

## 2012-11-13 DIAGNOSIS — R059 Cough, unspecified: Secondary | ICD-10-CM

## 2012-11-13 DIAGNOSIS — R0602 Shortness of breath: Secondary | ICD-10-CM

## 2012-11-13 DIAGNOSIS — J189 Pneumonia, unspecified organism: Secondary | ICD-10-CM | POA: Diagnosis present

## 2012-11-13 DIAGNOSIS — Z8673 Personal history of transient ischemic attack (TIA), and cerebral infarction without residual deficits: Secondary | ICD-10-CM

## 2012-11-13 DIAGNOSIS — G4700 Insomnia, unspecified: Secondary | ICD-10-CM

## 2012-11-13 DIAGNOSIS — J45909 Unspecified asthma, uncomplicated: Secondary | ICD-10-CM

## 2012-11-13 DIAGNOSIS — F341 Dysthymic disorder: Secondary | ICD-10-CM

## 2012-11-13 DIAGNOSIS — F329 Major depressive disorder, single episode, unspecified: Secondary | ICD-10-CM

## 2012-11-13 DIAGNOSIS — Z9884 Bariatric surgery status: Secondary | ICD-10-CM

## 2012-11-13 DIAGNOSIS — F909 Attention-deficit hyperactivity disorder, unspecified type: Secondary | ICD-10-CM | POA: Diagnosis present

## 2012-11-13 DIAGNOSIS — Z87891 Personal history of nicotine dependence: Secondary | ICD-10-CM

## 2012-11-13 DIAGNOSIS — R05 Cough: Secondary | ICD-10-CM

## 2012-11-13 DIAGNOSIS — M797 Fibromyalgia: Secondary | ICD-10-CM | POA: Diagnosis present

## 2012-11-13 DIAGNOSIS — IMO0001 Reserved for inherently not codable concepts without codable children: Secondary | ICD-10-CM | POA: Diagnosis present

## 2012-11-13 DIAGNOSIS — R5381 Other malaise: Secondary | ICD-10-CM | POA: Diagnosis present

## 2012-11-13 DIAGNOSIS — J441 Chronic obstructive pulmonary disease with (acute) exacerbation: Principal | ICD-10-CM | POA: Diagnosis present

## 2012-11-13 DIAGNOSIS — I639 Cerebral infarction, unspecified: Secondary | ICD-10-CM

## 2012-11-13 LAB — BASIC METABOLIC PANEL
Chloride: 107 mEq/L (ref 96–112)
GFR calc Af Amer: >90 mL/min (ref >90)
GFR calc non Af Amer: >90 mL/min (ref >90)
Potassium: 3.2 mEq/L — ABNORMAL LOW (ref 3.5–5.1)
Sodium: 141 mEq/L (ref 135–145)

## 2012-11-13 LAB — POCT CBC
Granulocyte percent: 74.2 %G (ref 37–80)
Hemoglobin: 13 g/dL (ref 12.2–16.2)
MID (cbc): 0.7 (ref 0–0.9)
MPV: 8.2 fL (ref 0–99.8)
POC MID %: 5.4 %M (ref 0–12)
Platelet Count, POC: 335 10*3/uL (ref 142–424)
RBC: 4.35 M/uL (ref 4.04–5.48)
WBC: 13.4 10*3/uL — AB (ref 4.6–10.2)

## 2012-11-13 LAB — POCT INFLUENZA A/B: Influenza B, POC: NEGATIVE

## 2012-11-13 MED ORDER — FLUTICASONE PROPIONATE 50 MCG/ACT NA SUSP
2.0000 | Freq: Every day | NASAL | Status: DC
  Administered 2012-11-14 – 2012-11-16 (×3): 2 via NASAL
  Filled 2012-11-13: qty 16

## 2012-11-13 MED ORDER — ONDANSETRON HCL 4 MG PO TABS: 4.0000 mg | ORAL_TABLET | Freq: Four times a day (QID) | ORAL | Status: DC | PRN

## 2012-11-13 MED ORDER — DOCUSATE SODIUM 100 MG PO CAPS
100.0000 mg | ORAL_CAPSULE | Freq: Two times a day (BID) | ORAL | Status: DC
  Administered 2012-11-13 – 2012-11-16 (×6): 100 mg via ORAL
  Filled 2012-11-13 (×7): qty 1

## 2012-11-13 MED ORDER — LEVOFLOXACIN IN D5W 750 MG/150ML IV SOLN
750.0000 mg | Freq: Once | INTRAVENOUS | Status: AC
  Administered 2012-11-13: 750 mg via INTRAVENOUS
  Filled 2012-11-13: qty 150

## 2012-11-13 MED ORDER — ONDANSETRON HCL 4 MG/2ML IJ SOLN
4.0000 mg | Freq: Four times a day (QID) | INTRAMUSCULAR | Status: DC | PRN
  Administered 2012-11-14 – 2012-11-15 (×2): 4 mg via INTRAVENOUS
  Filled 2012-11-13 (×2): qty 2

## 2012-11-13 MED ORDER — EST ESTROGENS-METHYLTEST 0.625-1.25 MG PO TABS
1.0000 | ORAL_TABLET | Freq: Every day | ORAL | Status: DC
  Administered 2012-11-16: 1 via ORAL
  Filled 2012-11-13: qty 1

## 2012-11-13 MED ORDER — LEVOFLOXACIN IN D5W 750 MG/150ML IV SOLN
750.0000 mg | INTRAVENOUS | Status: DC
  Administered 2012-11-14 – 2012-11-15 (×2): 750 mg via INTRAVENOUS
  Filled 2012-11-13 (×3): qty 150

## 2012-11-13 MED ORDER — HYDROMORPHONE HCL PF 1 MG/ML IJ SOLN
1.0000 mg | Freq: Once | INTRAMUSCULAR | Status: AC
  Administered 2012-11-13: 1 mg via INTRAVENOUS
  Filled 2012-11-13: qty 1

## 2012-11-13 MED ORDER — HYDROMORPHONE HCL PF 1 MG/ML IJ SOLN
1.0000 mg | INTRAMUSCULAR | Status: DC | PRN
  Administered 2012-11-13 – 2012-11-15 (×13): 1 mg via INTRAVENOUS
  Filled 2012-11-13 (×13): qty 1

## 2012-11-13 MED ORDER — CEFTRIAXONE SODIUM 1 G IJ SOLR
1.0000 g | Freq: Once | INTRAMUSCULAR | Status: AC
  Administered 2012-11-13: 1 g via INTRAMUSCULAR

## 2012-11-13 MED ORDER — ONDANSETRON HCL 4 MG/2ML IJ SOLN
4.0000 mg | Freq: Once | INTRAMUSCULAR | Status: AC
  Administered 2012-11-13: 4 mg via INTRAVENOUS
  Filled 2012-11-13 (×2): qty 2

## 2012-11-13 MED ORDER — METHYLPREDNISOLONE SODIUM SUCC 40 MG IJ SOLR
40.0000 mg | Freq: Four times a day (QID) | INTRAMUSCULAR | Status: DC
  Administered 2012-11-13 – 2012-11-14 (×3): 40 mg via INTRAVENOUS
  Filled 2012-11-13 (×6): qty 1

## 2012-11-13 MED ORDER — ALBUTEROL SULFATE (5 MG/ML) 0.5% IN NEBU
2.5000 mg | INHALATION_SOLUTION | Freq: Once | RESPIRATORY_TRACT | Status: AC
  Administered 2012-11-13: 2.5 mg via RESPIRATORY_TRACT
  Filled 2012-11-13: qty 0.5

## 2012-11-13 MED ORDER — ALBUTEROL SULFATE (5 MG/ML) 0.5% IN NEBU
2.5000 mg | INHALATION_SOLUTION | Freq: Four times a day (QID) | RESPIRATORY_TRACT | Status: DC
  Administered 2012-11-14 – 2012-11-16 (×11): 2.5 mg via RESPIRATORY_TRACT
  Filled 2012-11-13 (×10): qty 0.5

## 2012-11-13 MED ORDER — DULOXETINE HCL 60 MG PO CPEP
60.0000 mg | ORAL_CAPSULE | Freq: Two times a day (BID) | ORAL | Status: DC
  Administered 2012-11-13 – 2012-11-16 (×6): 60 mg via ORAL
  Filled 2012-11-13 (×7): qty 1

## 2012-11-13 MED ORDER — PANTOPRAZOLE SODIUM 40 MG PO TBEC
40.0000 mg | DELAYED_RELEASE_TABLET | Freq: Every day | ORAL | Status: DC
  Administered 2012-11-14 – 2012-11-16 (×3): 40 mg via ORAL
  Filled 2012-11-13 (×3): qty 1

## 2012-11-13 MED ORDER — ALBUTEROL SULFATE (5 MG/ML) 0.5% IN NEBU: 2.5000 mg | INHALATION_SOLUTION | RESPIRATORY_TRACT | Status: DC | PRN

## 2012-11-13 MED ORDER — IPRATROPIUM BROMIDE 0.02 % IN SOLN
0.5000 mg | Freq: Once | RESPIRATORY_TRACT | Status: AC
  Administered 2012-11-13: 0.5 mg via RESPIRATORY_TRACT
  Filled 2012-11-13: qty 2.5

## 2012-11-13 MED ORDER — DEXTROSE-NACL 5-0.9 % IV SOLN
INTRAVENOUS | Status: DC
  Administered 2012-11-13 – 2012-11-16 (×6): via INTRAVENOUS

## 2012-11-13 MED ORDER — AMPHETAMINE-DEXTROAMPHET ER 10 MG PO CP24
30.0000 mg | ORAL_CAPSULE | Freq: Every day | ORAL | Status: DC
  Administered 2012-11-14 – 2012-11-16 (×2): 30 mg via ORAL
  Filled 2012-11-13 (×3): qty 3

## 2012-11-13 MED ORDER — ENOXAPARIN SODIUM 40 MG/0.4ML ~~LOC~~ SOLN
40.0000 mg | SUBCUTANEOUS | Status: DC
  Administered 2012-11-13: 40 mg via SUBCUTANEOUS
  Filled 2012-11-13 (×2): qty 0.4

## 2012-11-13 MED ORDER — HYDROMORPHONE HCL PF 1 MG/ML IJ SOLN
1.0000 mg | Freq: Once | INTRAMUSCULAR | Status: AC
  Administered 2012-11-13: 1 mg via INTRAVENOUS
  Filled 2012-11-13 (×2): qty 1

## 2012-11-13 MED ORDER — MOMETASONE FURO-FORMOTEROL FUM 200-5 MCG/ACT IN AERO
2.0000 | INHALATION_SPRAY | Freq: Two times a day (BID) | RESPIRATORY_TRACT | Status: DC
  Administered 2012-11-14: 2 via RESPIRATORY_TRACT
  Filled 2012-11-13: qty 8.8

## 2012-11-13 MED ORDER — VALACYCLOVIR HCL 500 MG PO TABS
1000.0000 mg | ORAL_TABLET | Freq: Every day | ORAL | Status: DC
  Administered 2012-11-13 – 2012-11-16 (×4): 1000 mg via ORAL
  Filled 2012-11-13 (×4): qty 2

## 2012-11-13 MED ORDER — HYDROXYZINE HCL 25 MG PO TABS
12.5000 mg | ORAL_TABLET | Freq: Three times a day (TID) | ORAL | Status: DC | PRN
  Filled 2012-11-13: qty 1

## 2012-11-13 MED ORDER — BENZONATATE 100 MG PO CAPS
100.0000 mg | ORAL_CAPSULE | Freq: Three times a day (TID) | ORAL | Status: DC | PRN
  Administered 2012-11-15 – 2012-11-16 (×3): 200 mg via ORAL
  Filled 2012-11-13 (×4): qty 2

## 2012-11-13 MED ORDER — MONTELUKAST SODIUM 10 MG PO TABS
10.0000 mg | ORAL_TABLET | Freq: Every day | ORAL | Status: DC
  Administered 2012-11-13 – 2012-11-15 (×3): 10 mg via ORAL
  Filled 2012-11-13 (×4): qty 1

## 2012-11-13 NOTE — H&P (Signed)
[facility] History and Physical  Icelyn Navarrete ZOX:[id number] DOB: [birth year]    PCP:   JEFFERY,CHELLE, PA-C   Chief Complaint:  Shortness of breath.  HPI: Diana Benson is an 47 y.o. female with hx of asthma, tobacco abuse, prior CVA, COPD, anxiety, ADHD, depression, GERD, presents to the urgent care of productive coughs for 2 weeks, subjective fever, and shortness of breath.  She denied chest pain or myalgia, ill contact or distant travel.  She was given a one dose Zithromax regimen, about 2 weeks ago without relief.  Evaluation included a CXR with a lingular infiltrate, a WBC of 13K, with normal renal Fx tests.  She was given neb Tx and IV Levaquin, and hospitalist was asked to admit her for CAP.  At the urgent care, she reportedly desat to 80's percent with ambulation.  Rewiew of Systems:  Constitutional: Negative for malaise, fever and chills. No significant weight loss or weight gain Eyes: Negative for eye pain, redness and discharge, diplopia, visual changes, or flashes of light. ENMT: Negative for ear pain, hoarseness, nasal congestion, sinus pressure and sore throat. No headaches; tinnitus, drooling, or problem swallowing. Cardiovascular: Negative for chest pain, palpitations, diaphoresis, dyspnea and peripheral edema. ; No orthopnea, PND Respiratory: Negative for hemoptysis, and stridor. No pleuritic chestpain. Gastrointestinal: Negative for nausea, vomiting, diarrhea, constipation, abdominal pain, melena, blood in stool, hematemesis, jaundice and rectal bleeding.    Genitourinary: Negative for frequency, dysuria, incontinence,flank pain and hematuria; Musculoskeletal: Negative for back pain and neck pain. Negative for swelling and trauma.;  Skin: . Negative for pruritus, rash, abrasions, bruising and skin lesion.; ulcerations Neuro: Negative for headache, lightheadedness and neck stiffness. Negative for weakness, altered level of consciousness , altered mental status, extremity  weakness, burning feet, involuntary movement, seizure and syncope.  Psych: negative for anxiety, depression, insomnia, tearfulness, panic attacks, hallucinations, paranoia, suicidal or homicidal ideation.   Past Medical History  Diagnosis Date  . COPD (chronic obstructive pulmonary disease) with chronic bronchitis   . CVA (cerebral vascular accident)   . GERD (gastroesophageal reflux disease)   . Asthma   . ADHD (attention deficit hyperactivity disorder)   . Anxiety   . Depression   . Fibromyalgia     Past Surgical History  Procedure Laterality Date  . Appendectomy    . Abdominal hysterectomy    . Gastric bypass      Medications:  HOME MEDS: Prior to Admission medications   Medication Sig Start Date End Date Taking? Authorizing Provider  acyclovir cream (ZOVIRAX) 5 % Apply 1 application topically every 3 (three) hours. 05/23/12  Yes Chelle S Jeffery, PA-C  albuterol (PROAIR HFA) 108 (90 BASE) MCG/ACT inhaler Inhale 2 puffs into the lungs every 4 (four) hours as needed for wheezing or shortness of breath. 09/18/12  Yes Chelle S Jeffery, PA-C  albuterol (PROVENTIL) (5 MG/ML) 0.5% nebulizer solution Take 0.5 mLs (2.5 mg total) by nebulization every 6 (six) hours as needed. 09/18/12  Yes Chelle S Jeffery, PA-C  ALPRAZolam (XANAX) 1 MG tablet Take 1 tablet (1 mg total) by mouth 3 (three) times daily as needed. 09/18/12  Yes Chelle S Jeffery, PA-C  amphetamine-dextroamphetamine (ADDERALL XR) 30 MG 24 hr capsule 2 tablets qam and 1 q evening. BRAND MEDICALLY NECESSARY. 09/18/12  Yes Chelle S Jeffery, PA-C  benzonatate (TESSALON) 100 MG capsule Take 1-2 capsules (100-200 mg total) by mouth 3 (three) times daily as needed for cough. 11/03/12  Yes Chelle S Jeffery, PA-C  DULoxetine (CYMBALTA) 60 MG capsule Take  1 capsule (60 mg total) by mouth 2 (two) times daily. 09/18/12  Yes Chelle S Jeffery, PA-C  esomeprazole (NEXIUM) 40 MG capsule Take 1 capsule (40 mg total) by mouth 2 (two) times daily.  09/18/12  Yes Chelle S Jeffery, PA-C  estrogen-methylTESTOSTERone 0.625-1.25 MG per tablet Take 1 tablet by mouth daily. 05/23/12  Yes Chelle S Jeffery, PA-C  fluticasone (FLONASE) 50 MCG/ACT nasal spray Place 2 sprays into the nose daily. 05/23/12 05/23/13 Yes Chelle S Jeffery, PA-C  Fluticasone-Salmeterol (ADVAIR) 500-50 MCG/DOSE AEPB Inhale 1 puff into the lungs 2 (two) times daily. 09/18/12  Yes Chelle S Jeffery, PA-C  hydrOXYzine (ATARAX/VISTARIL) 25 MG tablet Take 0.5-1 tablets (12.5-25 mg total) by mouth every 8 (eight) hours as needed for itching. 09/18/12  Yes Chelle S Jeffery, PA-C  montelukast (SINGULAIR) 10 MG tablet Take 1 tablet (10 mg total) by mouth 1 day or 1 dose. 05/23/12  Yes Chelle S Jeffery, PA-C  promethazine (PHENERGAN) 25 MG suppository Place 1 suppository (25 mg total) rectally every 6 (six) hours as needed for nausea. 11/03/12  Yes Chelle S Jeffery, PA-C  valACYclovir (VALTREX) 1000 MG tablet Take 1 tablet (1,000 mg total) by mouth daily. 09/18/12  Yes Chelle S Jeffery, PA-C  zolpidem (AMBIEN) 10 MG tablet Take 1-1.5 tablets (10-15 mg total) by mouth at bedtime as needed. 09/18/12  Yes Chelle S Jeffery, PA-C  predniSONE (DELTASONE) 20 MG tablet Take 3 PO QAM x3days, 2 PO QAM x3days, 1 PO QAM x3days 11/03/12   Chelle S Jeffery, PA-C  zolpidem (AMBIEN) 10 MG tablet Take 1-1.5 tablets (10-15 mg total) by mouth at bedtime as needed for sleep. May fill 60 days after date on prescription 09/18/12 10/18/12  Fernande Bras, PA-C     Allergies:  Allergies  Allergen Reactions  . Codeine Nausea And Vomiting  . Flexeril [Cyclobenzaprine] Other (See Comments)    "Makes me feel like I'm coming out of my skin."  . Morphine And Related Nausea And Vomiting  . Venlafaxine Nausea Only    Social History:   reports that she quit smoking about 2 years ago. Her smoking use included Cigarettes. She has a 17 pack-year smoking history. She has never used smokeless tobacco. She reports that she drinks  alcohol. She reports that she does not use illicit drugs.  Family History: Family History  Problem Relation Age of Onset  . Osteoporosis Mother   . Hypertension Mother   . Stroke Father   . Drug abuse Daughter      Physical Exam: Filed Vitals:   11/13/12 1739 11/13/12 1858 11/13/12 2025 11/13/12 2049  BP: 111/64 112/68    Pulse: 74 75    Temp: 98.2 F (36.8 C)     TempSrc: Oral     Resp: 16 18    SpO2: 99% 98% 91% 99%   Blood pressure 112/68, pulse 75, temperature 98.2 F (36.8 C), temperature source Oral, resp. rate 18, SpO2 99.00%.  GEN:  Pleasant  patient lying in the stretcher in no acute distress; cooperative with exam. PSYCH:  alert and oriented x4; does not appear anxious or depressed; affect is appropriate. HEENT: Mucous membranes pink and anicteric; PERRLA; EOM intact; no cervical lymphadenopathy nor thyromegaly or carotid bruit; no JVD; There were no stridor. Neck is very supple. Breasts:: Not examined CHEST WALL: No tenderness CHEST: Decreased BS throughout with significant wheezing. HEART: Regular rate and rhythm.  There are no murmur, rub, or gallops.   BACK: No kyphosis or scoliosis; no  CVA tenderness ABDOMEN: soft and non-tender; no masses, no organomegaly, normal abdominal bowel sounds; no pannus; no intertriginous candida. There is no rebound and no distention. Rectal Exam: Not done EXTREMITIES: No bone or joint deformity; age-appropriate arthropathy of the hands and knees; no edema; no ulcerations.  There is no calf tenderness. Genitalia: not examined PULSES: 2+ and symmetric SKIN: Normal hydration no rash or ulceration CNS: Cranial nerves 2-12 grossly intact no focal lateralizing neurologic deficit.  Speech is fluent; uvula elevated with phonation, facial symmetry and tongue midline. DTR are normal bilaterally, cerebella exam is intact, barbinski is negative and strengths are equaled bilaterally.  No sensory loss.   Labs on Admission:  Basic Metabolic  Panel:  Recent Labs Lab 11/13/12 1847  NA 141  K 3.2*  CL 107  CO2 25  GLUCOSE 101*  BUN 7  CREATININE 0.43*  CALCIUM 8.8   Liver Function Tests: No results found for this basename: AST, ALT, ALKPHOS, BILITOT, PROT, ALBUMIN,  in the last 168 hours No results found for this basename: LIPASE, AMYLASE,  in the last 168 hours No results found for this basename: AMMONIA,  in the last 168 hours CBC:  Recent Labs Lab 11/13/12 1547  WBC 13.4*  HGB 13.0  HCT 42.7  MCV 98.2*   Cardiac Enzymes: No results found for this basename: CKTOTAL, CKMB, CKMBINDEX, TROPONINI,  in the last 168 hours  CBG: No results found for this basename: GLUCAP,  in the last 168 hours   Radiological Exams on Admission: Dg Chest 2 View  11/13/2012   CLINICAL DATA:  Cough and wheezing.  EXAM: CHEST  2 VIEW  COMPARISON:  PA and lateral chest 11/03/2012.  FINDINGS: There is airspace disease in the lingula compatible with pneumonia. The right lung is clear. Heart size is normal. No pneumothorax or pleural effusion.  IMPRESSION: Lingular pneumonia.   Electronically Signed   By: Drusilla Kanner M.D.   On: 11/13/2012 17:21    Assessment/Plan Present on Admission:  . CAP (community acquired pneumonia) . Asthma . Fibromyalgia . ADHD (attention deficit hyperactivity disorder)  PLAN:  CAP of the lingular lobe, with significant wheezing in asthmatic smoker.  Will admit her for IV antibiotics, IV steroid, and frequent neb treatments.  She is stable, and will be given IV Levaquin, IV solumedrol, oxygen supplement, and IVF.  She will be admitted to general medical floor under [facility] service.  Thank you for allowing me to participate in her care.  Her home meds have been continued.  Other plans as per orders.  Code Status: FULL Unk Lightning, MD. [facility] Pager [phone] 7pm to 7am.  11/13/2012, 9:30 PM

## 2012-11-13 NOTE — ED Notes (Signed)
Per EMS- pt went to Urgent Care today with c/o of SOB, fatigue. Seen 10 days ago for respiratory illness. Chest x-ray today confirmed pneumonia. Sats 80% on exertion.

## 2012-11-13 NOTE — ED Notes (Signed)
Bed: ZO10 Expected date:  Expected time:  Means of arrival:  Comments: EMS-pneumo

## 2012-11-13 NOTE — ED Provider Notes (Signed)
CSN: [id number]     Arrival date & time 11/13/12  1725 History   First MD Initiated Contact with Patient 11/13/12 1727     Chief Complaint  Patient presents with  . Shortness of Breath  . Fatigue   (Consider location/radiation/quality/duration/timing/severity/associated sxs/prior Treatment) Patient is a 47 y.o. female presenting with shortness of breath. The history is provided by the patient.  Shortness of Breath Severity:  Moderate Timing:  Constant Progression:  Worsening Chronicity:  New Context: URI   Context: not fumes   Relieved by:  Nothing Worsened by:  Nothing tried Ineffective treatments: antibiotics per urgent care, recently on Azithromycin. Associated symptoms: chest pain, cough and fever   Associated symptoms: no abdominal pain and no vomiting     Past Medical History  Diagnosis Date  . COPD (chronic obstructive pulmonary disease) with chronic bronchitis   . CVA (cerebral vascular accident)   . GERD (gastroesophageal reflux disease)   . Asthma   . ADHD (attention deficit hyperactivity disorder)   . Anxiety   . Depression   . Fibromyalgia    Past Surgical History  Procedure Laterality Date  . Appendectomy    . Abdominal hysterectomy    . Gastric bypass     Family History  Problem Relation Age of Onset  . Osteoporosis Mother   . Hypertension Mother   . Stroke Father   . Drug abuse Daughter    History  Substance Use Topics  . Smoking status: Former Smoker -- 1.00 packs/day for 17 years    Types: Cigarettes    Quit date: 06/09/2010  . Smokeless tobacco: Never Used  . Alcohol Use: Yes     Comment: rare   OB History   Grav Para Term Preterm Abortions TAB SAB Ect Mult Living                 Review of Systems  Constitutional: Positive for fever.  Respiratory: Positive for cough and shortness of breath.   Cardiovascular: Positive for chest pain. Negative for leg swelling.  Gastrointestinal: Negative for vomiting and abdominal pain.  All other  systems reviewed and are negative.    Allergies  Codeine; Flexeril; Morphine and related; and Venlafaxine  Home Medications   Current Outpatient Rx  Name  Route  Sig  Dispense  Refill  . acyclovir cream (ZOVIRAX) 5 %   Topical   Apply 1 application topically every 3 (three) hours.   15 g   5   . albuterol (PROAIR HFA) 108 (90 BASE) MCG/ACT inhaler   Inhalation   Inhale 2 puffs into the lungs every 4 (four) hours as needed for wheezing or shortness of breath.   18 each   5   . albuterol (PROVENTIL) (5 MG/ML) 0.5% nebulizer solution   Nebulization   Take 0.5 mLs (2.5 mg total) by nebulization every 6 (six) hours as needed.   20 mL   5     Please give patient already mixed nebulizer soluti ...   . ALPRAZolam (XANAX) 1 MG tablet   Oral   Take 1 tablet (1 mg total) by mouth 3 (three) times daily as needed.   90 tablet   0   . amphetamine-dextroamphetamine (ADDERALL XR) 30 MG 24 hr capsule      2 tablets qam and 1 q evening. BRAND MEDICALLY NECESSARY.   90 capsule   0   . benzonatate (TESSALON) 100 MG capsule   Oral   Take 1-2 capsules (100-200 mg total) by mouth  3 (three) times daily as needed for cough.   40 capsule   0   . DULoxetine (CYMBALTA) 60 MG capsule   Oral   Take 1 capsule (60 mg total) by mouth 2 (two) times daily.   60 capsule   5   . esomeprazole (NEXIUM) 40 MG capsule   Oral   Take 1 capsule (40 mg total) by mouth 2 (two) times daily.   60 capsule   5   . estrogen-methylTESTOSTERone 0.625-1.25 MG per tablet   Oral   Take 1 tablet by mouth daily.   30 tablet   5   . fluticasone (FLONASE) 50 MCG/ACT nasal spray   Nasal   Place 2 sprays into the nose daily.   16 g   12   . Fluticasone-Salmeterol (ADVAIR) 500-50 MCG/DOSE AEPB   Inhalation   Inhale 1 puff into the lungs 2 (two) times daily.   60 each   12   . hydrOXYzine (ATARAX/VISTARIL) 25 MG tablet   Oral   Take 0.5-1 tablets (12.5-25 mg total) by mouth every 8 (eight) hours as  needed for itching.   30 tablet   5   . montelukast (SINGULAIR) 10 MG tablet   Oral   Take 1 tablet (10 mg total) by mouth 1 day or 1 dose.   30 tablet   12   . promethazine (PHENERGAN) 25 MG suppository   Rectal   Place 1 suppository (25 mg total) rectally every 6 (six) hours as needed for nausea.   12 each   1   . valACYclovir (VALTREX) 1000 MG tablet   Oral   Take 1 tablet (1,000 mg total) by mouth daily.   90 tablet   12   . zolpidem (AMBIEN) 10 MG tablet   Oral   Take 1-1.5 tablets (10-15 mg total) by mouth at bedtime as needed.   30 tablet   0   . predniSONE (DELTASONE) 20 MG tablet      Take 3 PO QAM x3days, 2 PO QAM x3days, 1 PO QAM x3days   18 tablet   0   . EXPIRED: zolpidem (AMBIEN) 10 MG tablet   Oral   Take 1-1.5 tablets (10-15 mg total) by mouth at bedtime as needed for sleep. May fill 60 days after date on prescription   30 tablet   0    BP 111/64  Pulse 74  Temp(Src) 98.2 F (36.8 C) (Oral)  Resp 16  SpO2 99% Physical Exam  Nursing note and vitals reviewed. Constitutional: She is oriented to person, place, and time. She appears well-developed and well-nourished. No distress.  HENT:  Head: Normocephalic and atraumatic.  Eyes: EOM are normal. Pupils are equal, round, and reactive to light.  Neck: Normal range of motion. Neck supple.  Cardiovascular: Normal rate and regular rhythm.  Exam reveals no friction rub.   No murmur heard. Pulmonary/Chest: Effort normal. No respiratory distress. She has decreased breath sounds (diffusely). She has no wheezes. She has no rales.  Abdominal: Soft. She exhibits no distension. There is no tenderness. There is no rebound.  Musculoskeletal: Normal range of motion. She exhibits no edema.  Neurological: She is alert and oriented to person, place, and time.  Skin: She is not diaphoretic.    ED Course  Procedures (including critical care time) Labs Review Labs Reviewed  CULTURE, BLOOD (ROUTINE X 2)   CULTURE, BLOOD (ROUTINE X 2)  BASIC METABOLIC PANEL   Imaging Review Dg Chest 2 View  11/13/2012   CLINICAL DATA:  Cough and wheezing.  EXAM: CHEST  2 VIEW  COMPARISON:  PA and lateral chest 11/03/2012.  FINDINGS: There is airspace disease in the lingula compatible with pneumonia. The right lung is clear. Heart size is normal. No pneumothorax or pleural effusion.  IMPRESSION: Lingular pneumonia.   Electronically Signed   By: Drusilla Kanner M.D.   On: 11/13/2012 17:21    EKG Interpretation     Ventricular Rate:    PR Interval:    QRS Duration:   QT Interval:    QTC Calculation:   R Axis:     Text Interpretation:              MDM   1. Lingular pneumonia   2. Shortness of breath    62F here with weakness, SOB. PNA diagnosed at urgent care clinic. Has recently been on a course of Azithromycin. Received Rocephin at the urgent care clinic. Satting 80% on exertion. 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 Here, AFVSS, satting high 90s on 2L [place]. Lungs with decreased movement diffusely. CXR reviewed by me, lingular pneumonia present. Levaquin given since she's recently been on Azithromycin. Cultures drawn. Admitted to medicine.    Dagmar Hait, MD 11/13/12 [phone]

## 2012-11-13 NOTE — ED Notes (Signed)
Hospitalist at bedside 

## 2012-11-13 NOTE — Progress Notes (Signed)
[facility] [address] [place] [address] [phone]  Date:  11/13/2012   Name:  Diana Benson   DOB:  08/19/1965   MRN:  [id number]  PCP:  JEFFERY,CHELLE, PA-C    Chief Complaint: Shortness of Breath, URI and Insomnia   History of Present Illness:  Diana Benson is a 47 y.o. very pleasant female patient who presents with the following:   History of FBM, asthma, ADHD, ? CVA Here today with illness.  She was seen 10 days ago with respiratory illness; cough to the point of vomiting/ feeling SOB. Her home nebs wee not helping like usual.  No fever.  A CXR showed no acute infiltrate (but did show a nodule so she is having a CT next week).   She was treated with azithromycin, prednisone and tessalon, phenergan suppository.  She seemed to be getting better and actually was able to go back to work for a few days.    However, yesterday/ early this morning she took a dramatic turn for the worse.  Early this morning she noted onset of wheezing and SOB.   She is still coughing up thick material.  She had a temp up to 103.  She has used tylneol for her symptoms.  Used her albuterol neb several times but it did not seem to help She notes body aches and extreme fatigue; "I feel like I've taken a sedative but I've only taken tylenol."  She did take one xanax last night around 10 pm.  No recent ambien.  No fiorinal Patient Active Problem List   Diagnosis Date Noted  . Asthma 01/19/2011  . Anxiety and depression 01/19/2011  . ADHD (attention deficit hyperactivity disorder) 01/19/2011  . Stroke 01/19/2011  . Fibromyalgia 01/19/2011    Past Medical History  Diagnosis Date  . COPD (chronic obstructive pulmonary disease) with chronic bronchitis   . CVA (cerebral vascular accident)   . GERD (gastroesophageal reflux disease)   . Asthma   . ADHD (attention deficit hyperactivity disorder)   . Anxiety   . Depression   . Fibromyalgia     Past Surgical History  Procedure  Laterality Date  . Appendectomy    . Abdominal hysterectomy    . Gastric bypass      History  Substance Use Topics  . Smoking status: Former Smoker -- 1.00 packs/day for 17 years    Types: Cigarettes    Quit date: 06/09/2010  . Smokeless tobacco: Never Used  . Alcohol Use: Yes     Comment: rare    Family History  Problem Relation Age of Onset  . Osteoporosis Mother   . Hypertension Mother   . Stroke Father   . Drug abuse Daughter     Allergies  Allergen Reactions  . Codeine Nausea And Vomiting  . Flexeril [Cyclobenzaprine] Other (See Comments)    "Makes me feel like I'm coming out of my skin."  . Morphine And Related Nausea And Vomiting  . Venlafaxine Nausea Only    Medication list has been reviewed and updated.  Current Outpatient Prescriptions on File Prior to Visit  Medication Sig Dispense Refill  . acyclovir cream (ZOVIRAX) 5 % Apply 1 application topically every 3 (three) hours.  15 g  5  . albuterol (PROAIR HFA) 108 (90 BASE) MCG/ACT inhaler Inhale 2 puffs into the lungs every 4 (four) hours as needed for wheezing or shortness of breath.  18 each  5  . albuterol (PROVENTIL) (5 MG/ML) 0.5%  nebulizer solution Take 0.5 mLs (2.5 mg total) by nebulization every 6 (six) hours as needed.  20 mL  5  . ALPRAZolam (XANAX) 1 MG tablet Take 1 tablet (1 mg total) by mouth 3 (three) times daily as needed.  90 tablet  0  . ALPRAZolam (XANAX) 1 MG tablet Take 1 tablet (1 mg total) by mouth 3 (three) times daily as needed for anxiety. May fill 30 days after date on prescription  90 tablet  0  . ALPRAZolam (XANAX) 1 MG tablet Take 1 tablet (1 mg total) by mouth 3 (three) times daily as needed for anxiety. May fill 60 days after date on prescription  90 tablet  0  . amphetamine-dextroamphetamine (ADDERALL XR) 30 MG 24 hr capsule 2 tablets qam and 1 q evening. BRAND MEDICALLY NECESSARY.  90 capsule  0  . amphetamine-dextroamphetamine (ADDERALL XR) 30 MG 24 hr capsule Take 2 PO QAM, 1 PO  QPM. BRAND MEDICALLY NECESSARY.  May fill 30 days after date on prescription  90 capsule  0  . amphetamine-dextroamphetamine (ADDERALL XR) 30 MG 24 hr capsule Take 2 PO QAM, 1 PO QPM. BRAND MEDICALLY NECESSARY. May fill 60 days after date on prescription  90 capsule  0  . azithromycin (ZITHROMAX) 500 MG tablet Take 1 tablet (500 mg total) by mouth daily.  5 tablet  0  . benzonatate (TESSALON) 100 MG capsule Take 1-2 capsules (100-200 mg total) by mouth 3 (three) times daily as needed for cough.  40 capsule  0  . butalbital-aspirin-caffeine (FIORINAL) 50-325-40 MG per capsule Take 1 capsule by mouth every 6 (six) hours as needed for headache.  10 capsule  0  . DULoxetine (CYMBALTA) 60 MG capsule Take 1 capsule (60 mg total) by mouth 2 (two) times daily.  60 capsule  5  . esomeprazole (NEXIUM) 40 MG capsule Take 1 capsule (40 mg total) by mouth 2 (two) times daily.  60 capsule  5  . estrogen-methylTESTOSTERone 0.625-1.25 MG per tablet Take 1 tablet by mouth daily.  30 tablet  5  . fluticasone (FLONASE) 50 MCG/ACT nasal spray Place 2 sprays into the nose daily.  16 g  12  . Fluticasone-Salmeterol (ADVAIR) 500-50 MCG/DOSE AEPB Inhale 1 puff into the lungs 2 (two) times daily.  60 each  12  . hydrOXYzine (ATARAX/VISTARIL) 25 MG tablet Take 0.5-1 tablets (12.5-25 mg total) by mouth every 8 (eight) hours as needed for itching.  30 tablet  5  . meloxicam (MOBIC) 15 MG tablet Take 1 tablet (15 mg total) by mouth daily.  30 tablet  5  . montelukast (SINGULAIR) 10 MG tablet Take 1 tablet (10 mg total) by mouth 1 day or 1 dose.  30 tablet  12  . predniSONE (DELTASONE) 20 MG tablet Take 3 PO QAM x3days, 2 PO QAM x3days, 1 PO QAM x3days  18 tablet  0  . promethazine (PHENERGAN) 25 MG suppository Place 1 suppository (25 mg total) rectally every 6 (six) hours as needed for nausea.  12 each  1  . valACYclovir (VALTREX) 1000 MG tablet Take 1 tablet (1,000 mg total) by mouth daily.  90 tablet  12  . zolpidem (AMBIEN)  10 MG tablet Take 1-1.5 tablets (10-15 mg total) by mouth at bedtime as needed.  30 tablet  0  . zolpidem (AMBIEN) 10 MG tablet Take 1-1.5 tablets (10-15 mg total) by mouth at bedtime as needed for sleep. May fill 30 days after date on prescription  30 tablet  0  . zolpidem (AMBIEN) 10 MG tablet Take 1-1.5 tablets (10-15 mg total) by mouth at bedtime as needed for sleep. May fill 60 days after date on prescription  30 tablet  0   No current facility-administered medications on file prior to visit.    Review of Systems:  As per HPI- otherwise negative.   Physical Examination: Filed Vitals:   11/13/12 1427  BP: 122/76  Pulse: 98  Temp: 98.6 F (37 C)  Resp: 17   Filed Vitals:   11/13/12 1427  Height: 5\' 3"  (1.6 m)  Weight: 142 lb (64.411 kg)   Body mass index is 25.16 kg/(m^2). Ideal Body Weight: Weight in (lb) to have BMI = 25: 140.8  Asleep in room when I came in, was somewhat difficult to wake.  Fell asleep while I was speaking to her a couple of times.   GEN: WDWN, NAD, Non-toxic, A & O x 3 HEENT: Atraumatic, Normocephalic. Neck supple. No masses, No LAD.  Bilateral TM wnl, oropharynx normal.  PEERL,EOMI.   Ears and Nose: No external deformity. CV: RRR, No M/G/R. No JVD. No thrill. No extra heart sounds. PULM: Poor air movement bilaterally but no crackles or ronchi.  Neb treatment did not improve her air flow ABD: S, NT, ND. EXTR: No c/c/e NEURO Normal gait.  PSYCH: Normally interactive when awake but frequently somnolent  Results for orders placed in visit on 11/13/12  POCT CBC      Result Value Range   WBC 13.4 (*) 4.6 - 10.2 K/uL   Lymph, poc 2.7  0.6 - 3.4   POC LYMPH PERCENT 20.4  10 - 50 %L   MID (cbc) 0.7  0 - 0.9   POC MID % 5.4  0 - 12 %M   POC Granulocyte 9.9 (*) 2 - 6.9   Granulocyte percent 74.2  37 - 80 %G   RBC 4.35  4.04 - 5.48 M/uL   Hemoglobin 13.0  12.2 - 16.2 g/dL   HCT, POC 21.3  08.6 - 47.9 %   MCV 98.2 (*) 80 - 97 fL   MCH, POC 29.9  27 -  31.2 pg   MCHC 30.4 (*) 31.8 - 35.4 g/dL   RDW, POC 57.8     Platelet Count, POC 335  142 - 424 K/uL   MPV 8.2  0 - 99.8 fL  POCT INFLUENZA A/B      Result Value Range   Influenza A, POC Negative     Influenza B, POC Negative     Pt needed help to walk to x-ray as she is quite unsteady on her feet.   [facility] reading (PRIMARY) by  Dr. Patsy Lager. CXR: left sided pneumonia.    Diana Benson's original VS and sat look ok.  However, I am concerned about how somnolent she is.   Had pt walk just a few steps; sat dropped to 80-85%.  She became very unsteady and nearly fell.   Had her lie down, started O2 [place] at liters and started albuterol neb.   Albuterol neb did not help; sat to 96% on oxygen Assessment and Plan: Cough - Plan: POCT CBC, POCT Influenza A/B, DG Chest 2 View  Fever, unspecified - Plan: POCT Influenza A/B  CAP (community acquired pneumonia) - Plan: cefTRIAXone (ROCEPHIN) injection 1 g  Layni is ill today with pneumonia.  Although her x-ray does not look very impressive she appears quite ill and desaturated to the mid 80s with just a few steps.  Will transfer to the ED for likely admission.  I am concerned that she will not be able to tolerate transfer by car and will transfer her by EMS so she can continue oxygen therapy.  She and her father are ok with this plan.    Signed Abbe Amsterdam, MD

## 2012-11-13 NOTE — ED Notes (Signed)
Pt requesting more pain medicine  MD aware

## 2012-11-14 DIAGNOSIS — IMO0001 Reserved for inherently not codable concepts without codable children: Secondary | ICD-10-CM

## 2012-11-14 MED ORDER — METHYLPREDNISOLONE SODIUM SUCC 40 MG IJ SOLR
40.0000 mg | Freq: Two times a day (BID) | INTRAMUSCULAR | Status: DC
  Administered 2012-11-14 – 2012-11-16 (×4): 40 mg via INTRAVENOUS
  Filled 2012-11-14 (×4): qty 1

## 2012-11-14 MED ORDER — IPRATROPIUM BROMIDE 0.02 % IN SOLN: 0.5000 mg | RESPIRATORY_TRACT | Status: DC | PRN

## 2012-11-14 MED ORDER — ADULT MULTIVITAMIN W/MINERALS CH
1.0000 | ORAL_TABLET | Freq: Every day | ORAL | Status: DC
  Administered 2012-11-14 – 2012-11-16 (×3): 1 via ORAL
  Filled 2012-11-14 (×3): qty 1

## 2012-11-14 MED ORDER — ALBUTEROL SULFATE (5 MG/ML) 0.5% IN NEBU: 2.5000 mg | INHALATION_SOLUTION | RESPIRATORY_TRACT | Status: DC | PRN

## 2012-11-14 MED ORDER — IPRATROPIUM BROMIDE 0.02 % IN SOLN
0.5000 mg | Freq: Four times a day (QID) | RESPIRATORY_TRACT | Status: DC
  Administered 2012-11-14 – 2012-11-16 (×9): 0.5 mg via RESPIRATORY_TRACT
  Filled 2012-11-14 (×8): qty 2.5

## 2012-11-14 NOTE — Progress Notes (Signed)
INITIAL NUTRITION ASSESSMENT  DOCUMENTATION CODES Per approved criteria  -Not Applicable   INTERVENTION: Provide Multivitamin with minerals daily Encourage PO intake RD to continue to monitor and provide further intervention as needed  NUTRITION DIAGNOSIS: Unintentional wt loss related to life stress and varied appetite as evidenced by 8% wt loss in less than 2 months.   Goal: Pt to meet >/= 90% of their estimated nutrition needs   Monitor:  PO intake Weight Labs  Reason for Assessment: Consult  47 y.o. female  Admitting Dx: CAP (community acquired pneumonia)  ASSESSMENT: 47 y.o. female with hx of asthma, tobacco abuse, prior CVA, COPD, anxiety, ADHD, depression, GERD, presents to the urgent care of productive coughs for 2 weeks, subjective fever, and shortness of breath.  Pt reports having a good appetite today and has been eating 75-90% of meals. Pt reports that she usually weighs 155 lbs; due to life stress and varied appetite she has lost down to 139 lbs. Pt reports history of gastric bypass surgery in 2003. Encouraged pt to continue eating 3 balanced meals daily.   Nutrition Focused Physical Exam:  Subcutaneous Fat:  Orbital Region: wnl Upper Arm Region: wnl Thoracic and Lumbar Region: NA  Muscle:  Temple Region: wnl Clavicle Bone Region: wnl Clavicle and Acromion Bone Region: wnl Scapular Bone Region: NA Dorsal Hand: wnl Patellar Region: wnl Anterior Thigh Region: wnl Posterior Calf Region: wnl  Edema: none  Height: Ht Readings from Last 1 Encounters:  11/13/12 5\' 2"  (1.575 m)    Weight: Wt Readings from Last 1 Encounters:  11/13/12 143 lb 15.4 oz (65.3 kg)    Ideal Body Weight: 110 lbs  % Ideal Body Weight: 130%  Wt Readings from Last 10 Encounters:  11/13/12 143 lb 15.4 oz (65.3 kg)  11/13/12 142 lb (64.411 kg)  11/03/12 145 lb (65.772 kg)  09/18/12 149 lb 6.4 oz (67.767 kg)  05/23/12 161 lb (73.029 kg)  01/10/12 156 lb (70.761 kg)   12/14/11 159 lb 12.8 oz (72.485 kg)  10/25/11 164 lb (74.39 kg)  09/19/11 171 lb (77.565 kg)  08/23/11 169 lb (76.658 kg)    Usual Body Weight: 155 lbs  % Usual Body Weight: 92%  BMI:  Body mass index is 26.32 kg/(m^2).  Estimated Nutritional Needs: Kcal: 1600-1800 Protein: 90-105 grams Fluid: 1.8- 2 L/day  Skin: WDL  Diet Order: General  EDUCATION NEEDS: -No education needs identified at this time   Intake/Output Summary (Last 24 hours) at 11/14/12 1506 Last data filed at 11/14/12 1503  Gross per 24 hour  Intake   1080 ml  Output   1075 ml  Net      5 ml    Last BM: 11/13  Labs:   Recent Labs Lab 11/13/12 1847  NA 141  K 3.2*  CL 107  CO2 25  BUN 7  CREATININE 0.43*  CALCIUM 8.8  GLUCOSE 101*    CBG (last 3)  No results found for this basename: GLUCAP,  in the last 72 hours  Scheduled Meds: . albuterol  2.5 mg Nebulization Q6H  . amphetamine-dextroamphetamine  30 mg Oral Daily  . docusate sodium  100 mg Oral BID  . DULoxetine  60 mg Oral BID  . estrogen-methylTESTOSTERone  1 tablet Oral Daily  . fluticasone  2 spray Each Nare Daily  . ipratropium  0.5 mg Nebulization Q6H  . levofloxacin (LEVAQUIN) IV  750 mg Intravenous Q24H  . methylPREDNISolone (SOLU-MEDROL) injection  40 mg Intravenous Q12H  .  montelukast  10 mg Oral QHS  . pantoprazole  40 mg Oral Daily  . valACYclovir  1,000 mg Oral Daily    Continuous Infusions: . dextrose 5 % and 0.9% NaCl 100 mL/hr at 11/14/12 1610    Past Medical History  Diagnosis Date  . COPD (chronic obstructive pulmonary disease) with chronic bronchitis   . CVA (cerebral vascular accident)   . GERD (gastroesophageal reflux disease)   . Asthma   . ADHD (attention deficit hyperactivity disorder)   . Anxiety   . Depression   . Fibromyalgia     Past Surgical History  Procedure Laterality Date  . Appendectomy    . Abdominal hysterectomy    . Gastric bypass      Ian Malkin RD, LDN Inpatient  Clinical Dietitian Pager: [phone] After Hours Pager: [phone]

## 2012-11-14 NOTE — Care Management Note (Signed)
   CARE MANAGEMENT NOTE 11/14/2012  Patient:  Diana Benson, Diana Benson   Account Number:  [id number]  Date Initiated:  11/14/2012  Documentation initiated by:  Sanuel Ladnier  Subjective/Objective Assessment:   47 yo female admitted with CAP. PCP: JEFFERY,CHELLE, PA-C     Action/Plan:   Home when stable   Anticipated DC Date:     Anticipated DC Plan:  HOME/SELF CARE      DC Planning Services  CM consult      Choice offered to / List presented to:  NA   DME arranged  NA      DME agency  NA     HH arranged  NA      HH agency  NA   Status of service:  In process, will continue to follow Medicare Important Message given?   (If response is "NO", the following Medicare IM given date fields will be blank) Date Medicare IM given:   Date Additional Medicare IM given:    Discharge Disposition:    Per UR Regulation:  Reviewed for med. necessity/level of care/duration of stay  If discussed at Long Length of Stay Meetings, dates discussed:    Comments:  11/14/12 1444 Marsena Taff,RN,MSN [phone] Chart reviewed for utilization of services. No previous use of HH services. Pt's last hospitalization 1 yr ago. Pt has home nebulizer machine. No home oxygen. No pulmonologist.

## 2012-11-14 NOTE — Progress Notes (Signed)
[facility] PROGRESS NOTE  Diana Benson ZOX:[id number] DOB: 08-12-1965 DOA: 11/13/2012 PCP: JEFFERY,CHELLE, PA-C  Brief narrative: 47 -year-old female with past medical history of asthma, tobacco abuse, prior stroke and requirement for anticoagulation but currently she is not on anticoagulation), smoker, COPD, anxiety, ADHD, fibromyalgia who presented with complaints of productive cough for 2 weeks prior to this admission with associated fevers and shortness of breath. On admission, patient was found to have a lingular pneumonia based on chest x-ray. White blood cell count was 13,000. In addition her oxygen saturation was in 25' s. Patient was started on Levaquin for pneumonia.  Assessment/Plan:  Principal Problem:   CAP (community acquired pneumonia) - In lingular region based on chest x-ray - Continue Levaquin daily - Followup blood cultures obtained on the admission Active Problems:   COPD exacerbation - Changed nebulizer treatment regimen to every 2 hours as needed and every 6 hours scheduled, albuterol and Atrovent - Oxygen support with nasal cannula to keep oxygen saturation above 90 - Continue Solu-Medrol but decrease the frequency to every 12 hours instead of every 4 hours - Continue treatment for pneumonia - COPD Gold alert and COPD order set in place   ADHD (attention deficit hyperactivity disorder) - continue adderall   Fibromyalgia - continue cymbalta   Code Status: full code Family Communication: No family at the bedside Disposition Plan: Home when stable  Manson Passey, MD  [facility] Pager [phone]  If 7PM-7AM, please contact night-coverage [email or web address] Password [email or web address] 11/14/2012, 12:47 PM   LOS: 1 day   Consultants:  None   Procedures:  None   Antibiotics:  Levaquin 11//13/2014 -->  HPI/Subjective: Still complains of shortness of breath on exertion. Wheezing.  Objective: Filed Vitals:   11/13/12 2049 11/13/12 2133 11/14/12 0603  11/14/12 0824  BP:  108/55 108/57   Pulse:  70 66   Temp:  98 F (36.7 C) 97.7 F (36.5 C)   TempSrc:  Oral Oral   Resp:  18 20   Height:  5\' 2"  (1.575 m)    Weight:  65.3 kg (143 lb 15.4 oz)    SpO2: 99% 98% 98% 95%    Intake/Output Summary (Last 24 hours) at 11/14/12 1247 Last data filed at 11/14/12 1010  Gross per 24 hour  Intake    840 ml  Output    850 ml  Net    -10 ml    Exam:   General:  Pt is alert, follows commands appropriately, not in acute distress  Cardiovascular: Regular rate and rhythm, S1/S2, no murmurs, no rubs, no gallops  Respiratory: wheezing in upper lung lobes; rhonchi in left mid lung anterior   Abdomen: Soft, non tender, non distended, bowel sounds present, no guarding  Extremities: No edema, pulses DP and PT palpable bilaterally  Neuro: Grossly nonfocal  Data Reviewed: Basic Metabolic Panel:  Recent Labs Lab 11/13/12 1847  NA 141  K 3.2*  CL 107  CO2 25  GLUCOSE 101*  BUN 7  CREATININE 0.43*  CALCIUM 8.8   Liver Function Tests: No results found for this basename: AST, ALT, ALKPHOS, BILITOT, PROT, ALBUMIN,  in the last 168 hours No results found for this basename: LIPASE, AMYLASE,  in the last 168 hours No results found for this basename: AMMONIA,  in the last 168 hours CBC:  Recent Labs Lab 11/13/12 1547  WBC 13.4*  HGB 13.0  HCT 42.7  MCV 98.2*   Cardiac Enzymes: No results found for this basename: CKTOTAL, CKMB, CKMBINDEX,  TROPONINI,  in the last 168 hours BNP: No components found with this basename: POCBNP,  CBG: No results found for this basename: GLUCAP,  in the last 168 hours  No results found for this or any previous visit (from the past 240 hour(s)).   Studies: Dg Chest 2 View 11/13/2012    IMPRESSION: Lingular pneumonia.   Electronically Signed   By: Drusilla Kanner M.D.   On: 11/13/2012 17:21    Scheduled Meds: . albuterol  2.5 mg Nebulization Q6H  . docusate sodium  100 mg Oral BID  . DULoxetine   60 mg Oral BID  . estrogen-methylTESTO  1 tablet Oral Daily  . fluticasone  2 spray Each Nare Daily  . levofloxacin (LEVAQUIN)   750 mg Intravenous Q24H  . methylPREDNISolone   40 mg Intravenous Q12H  . mometasone-formoterol  2 puff Inhalation BID  . montelukast  10 mg Oral QHS  . pantoprazole  40 mg Oral Daily  . valACYclovir  1,000 mg Oral Daily   Continuous Infusions: . dextrose 5 % and 0.9% NaCl 100 mL/hr at 11/14/12 [phone]

## 2012-11-15 LAB — BASIC METABOLIC PANEL
CO2: 27 mEq/L (ref 19–32)
Calcium: 8.7 mg/dL (ref 8.4–10.5)
Chloride: 104 mEq/L (ref 96–112)
Creatinine, Ser: 0.46 mg/dL — ABNORMAL LOW (ref 0.50–1.10)
GFR calc non Af Amer: >90 mL/min (ref >90)
Glucose, Bld: 231 mg/dL — ABNORMAL HIGH (ref 70–99)

## 2012-11-15 LAB — CBC
HCT: 37 % (ref 36.0–46.0)
MCV: 94.9 fL (ref 78.0–100.0)
Platelets: 301 10*3/uL (ref 150–400)
RDW: 14.3 % (ref 11.5–15.5)
WBC: 20.9 10*3/uL — ABNORMAL HIGH (ref 4.0–10.5)

## 2012-11-15 MED ORDER — HYDROMORPHONE HCL PF 1 MG/ML IJ SOLN
1.0000 mg | INTRAMUSCULAR | Status: DC | PRN
  Administered 2012-11-15 – 2012-11-16 (×7): 2 mg via INTRAVENOUS
  Administered 2012-11-16: 1 mg via INTRAVENOUS
  Administered 2012-11-16: 2 mg via INTRAVENOUS
  Filled 2012-11-15 (×5): qty 2
  Filled 2012-11-15: qty 1
  Filled 2012-11-15 (×3): qty 2

## 2012-11-15 MED ORDER — POLYETHYLENE GLYCOL 3350 17 G PO PACK
17.0000 g | PACK | Freq: Every day | ORAL | Status: DC
  Administered 2012-11-15 – 2012-11-16 (×2): 17 g via ORAL
  Filled 2012-11-15 (×2): qty 1

## 2012-11-15 NOTE — Progress Notes (Signed)
[facility] PROGRESS NOTE  Diana Benson QIO:[id number] DOB: 07-17-1965 DOA: 11/13/2012 PCP: JEFFERY,CHELLE, PA-C  Brief narrative: 47 -year-old female with past medical history of asthma, tobacco abuse, prior stroke and requirement for anticoagulation but currently she is not on anticoagulation), smoker, COPD, anxiety, ADHD, fibromyalgia who presented with complaints of productive cough for 2 weeks prior to this admission with associated fevers and shortness of breath. On admission, patient was found to have a lingular pneumonia based on chest x-ray. White blood cell count was 13,000. In addition her oxygen saturation was in 22' s. Patient was started on Levaquin for pneumonia.   Assessment/Plan:   Principal Problem:  CAP (community acquired pneumonia)  - In lingular region based on chest x-ray  - Continue Levaquin daily  - Followup blood cultures - pending Active Problems:  COPD exacerbation  - Continue nebulizer treatments every 2 hours as needed and every 6 hours scheduled, albuterol and Atrovent  - Oxygen support with nasal cannula to keep oxygen saturation above 90  - Continue Solu-Medrol IV every 12 hours - Continue treatment for pneumonia  - COPD Gold alert and COPD order set in place  ADHD (attention deficit hyperactivity disorder)  - continue adderall  Fibromyalgia  - continue cymbalta   Code Status: full code  Family Communication: No family at the bedside  Disposition Plan: Home when stable   Manson Passey, MD  [facility]  Pager [phone]    Consultants:  None  Procedures:  None  Antibiotics:  Levaquin 11//13/2014 -->   Manson Passey, MD  [facility] Pager [phone]  If 7PM-7AM, please contact night-coverage [email or web address] Password [email or web address] 11/15/2012, 8:29 AM   LOS: 2 days    HPI/Subjective: Still congested, wheezing.  Objective: Filed Vitals:   11/14/12 1452 11/14/12 1928 11/14/12 2146 11/15/12 0500  BP: 130/60  115/59 104/52   Pulse: 91  74 73  Temp: 97.9 F (36.6 C)  98.4 F (36.9 C) 98.3 F (36.8 C)  TempSrc: Oral  Oral Oral  Resp: 20  16 16   Height:      Weight:      SpO2: 95% 96% 98% 99%    Intake/Output Summary (Last 24 hours) at 11/15/12 0829 Last data filed at 11/15/12 0500  Gross per 24 hour  Intake 2438.33 ml  Output   2025 ml  Net 413.33 ml    Exam:   General:  Pt is alert, follows commands appropriately, not in acute distress  Cardiovascular: Regular rate and rhythm, S1/S2, no murmurs, no rubs, no gallops  Respiratory: expiratory wheezing in upper and mid lung lobes  Abdomen: Soft, non tender, non distended, bowel sounds present, no guarding  Extremities: No edema, pulses DP and PT palpable bilaterally  Neuro: Grossly nonfocal  Data Reviewed: Basic Metabolic Panel:  Recent Labs Lab 11/13/12 1847 11/15/12 0403  NA 141 136  K 3.2* 4.5  CL 107 104  CO2 25 27  GLUCOSE 101* 231*  BUN 7 5*  CREATININE 0.43* 0.46*  CALCIUM 8.8 8.7   Liver Function Tests: No results found for this basename: AST, ALT, ALKPHOS, BILITOT, PROT, ALBUMIN,  in the last 168 hours No results found for this basename: LIPASE, AMYLASE,  in the last 168 hours No results found for this basename: AMMONIA,  in the last 168 hours CBC:  Recent Labs Lab 11/13/12 1547 11/15/12 0403  WBC 13.4* 20.9*  HGB 13.0 11.8*  HCT 42.7 37.0  MCV 98.2* 94.9  PLT  --  301   Cardiac Enzymes:  No results found for this basename: CKTOTAL, CKMB, CKMBINDEX, TROPONINI,  in the last 168 hours BNP: No components found with this basename: POCBNP,  CBG: No results found for this basename: GLUCAP,  in the last 168 hours  No results found for this or any previous visit (from the past 240 hour(s)).   Studies: Dg Chest 2 View  11/13/2012   CLINICAL DATA:  Cough and wheezing.  EXAM: CHEST  2 VIEW  COMPARISON:  PA and lateral chest 11/03/2012.  FINDINGS: There is airspace disease in the lingula compatible with pneumonia.  The right lung is clear. Heart size is normal. No pneumothorax or pleural effusion.  IMPRESSION: Lingular pneumonia.   Electronically Signed   By: Drusilla Kanner M.D.   On: 11/13/2012 17:21    Scheduled Meds: . albuterol  2.5 mg Nebulization Q6H  . amphetamine-dextroamphetamine  30 mg Oral Daily  . docusate sodium  100 mg Oral BID  . DULoxetine  60 mg Oral BID  . estrogen-methylTESTOSTERone  1 tablet Oral Daily  . fluticasone  2 spray Each Nare Daily  . ipratropium  0.5 mg Nebulization Q6H  . levofloxacin (LEVAQUIN) IV  750 mg Intravenous Q24H  . methylPREDNISolone (SOLU-MEDROL) injection  40 mg Intravenous Q12H  . montelukast  10 mg Oral QHS  . multivitamin with minerals  1 tablet Oral Daily  . pantoprazole  40 mg Oral Daily  . valACYclovir  1,000 mg Oral Daily   Continuous Infusions: . dextrose 5 % and 0.9% NaCl 100 mL/hr at 11/15/12 0554

## 2012-11-15 NOTE — Progress Notes (Signed)
OT Cancellation Note  Patient Details Name: Diana Benson MRN: [id number] DOB: 1965/03/27   Cancelled Treatment:    Reason Eval/Treat Not Completed: OT screened, no needs identified, will sign off  Yaelis Scharfenberg A 11/15/2012, 10:40 AM

## 2012-11-15 NOTE — Progress Notes (Signed)
PT Cancellation Note  Patient Details Name: Diana Benson MRN: [id number] DOB: August 12, 1965   Cancelled Treatment:    Reason Eval/Treat Not Completed: PT screened, no needs identified, will sign off, Checked with pt x 2  Who reports she is up ad lib and does not need PT at this time.   Rada Hay 11/15/2012, 4:30 PM Blanchard Kelch PT [phone]

## 2012-11-16 MED ORDER — DSS 100 MG PO CAPS: 100.0000 mg | ORAL_CAPSULE | Freq: Two times a day (BID) | ORAL | Status: DC | PRN

## 2012-11-16 MED ORDER — HYDROMORPHONE HCL 2 MG PO TABS: 2.0000 mg | ORAL_TABLET | ORAL | Status: DC | PRN

## 2012-11-16 MED ORDER — ZOLPIDEM TARTRATE 10 MG PO TABS: 10.0000 mg | ORAL_TABLET | Freq: Every evening | ORAL | Status: DC | PRN

## 2012-11-16 MED ORDER — ONDANSETRON HCL 4 MG PO TABS: 4.0000 mg | ORAL_TABLET | Freq: Four times a day (QID) | ORAL | Status: DC | PRN

## 2012-11-16 MED ORDER — PREDNISONE 50 MG PO TABS: ORAL_TABLET | ORAL | Status: DC

## 2012-11-16 MED ORDER — ALPRAZOLAM 1 MG PO TABS: 1.0000 mg | ORAL_TABLET | Freq: Three times a day (TID) | ORAL | Status: DC | PRN

## 2012-11-16 MED ORDER — GUAIFENESIN ER 600 MG PO TB12: 600.0000 mg | ORAL_TABLET | Freq: Two times a day (BID) | ORAL | Status: DC | PRN

## 2012-11-16 MED ORDER — AMPHETAMINE-DEXTROAMPHET ER 30 MG PO CP24: ORAL_CAPSULE | ORAL | Status: DC

## 2012-11-16 MED ORDER — HYDROMORPHONE HCL 2 MG PO TABS
2.0000 mg | ORAL_TABLET | ORAL | Status: DC | PRN
  Administered 2012-11-16: 2 mg via ORAL
  Filled 2012-11-16: qty 1

## 2012-11-16 MED ORDER — LEVOFLOXACIN 750 MG PO TABS: 750.0000 mg | ORAL_TABLET | Freq: Every day | ORAL | Status: DC

## 2012-11-16 MED ORDER — BENZONATATE 100 MG PO CAPS: 100.0000 mg | ORAL_CAPSULE | Freq: Three times a day (TID) | ORAL | Status: DC | PRN

## 2012-11-16 NOTE — Discharge Summary (Signed)
Physician Discharge Summary  Diana Benson JXB:[id number] DOB: 04-23-1965 DOA: 11/13/2012  PCP: Porfirio Oar, PA-C  Admit date: 11/13/2012 Discharge date: 11/16/2012  Recommendations for Outpatient Follow-up:  1. Take Levaquin for 10 days on discharge for pneumonia 2. Continue to take nebulizer treatment at home every 4-6 hours as needed for shortness of breath or wheezing 3. Continue prednisone 50 mg a day for 5 days. 4. Follow up with PCP for obtaining CT chest per previously scheduled date.  Discharge Diagnoses:  Principal Problem:   CAP (community acquired pneumonia) Active Problems:   Asthma   ADHD (attention deficit hyperactivity disorder)   Fibromyalgia    Discharge Condition: medically stable for discharge today  Diet recommendation: as tolerated  History of present illness:  47 -year-old female with past medical history of asthma, tobacco abuse, prior stroke and requirement for anticoagulation but currently she is not on anticoagulation), smoker, COPD, anxiety, ADHD, fibromyalgia who presented with complaints of productive cough for 2 weeks prior to this admission with associated fevers and shortness of breath. On admission, patient was found to have a lingular pneumonia based on chest x-ray. White blood cell count was 13,000. In addition her oxygen saturation was in 62' s. Patient was started on Levaquin for pneumonia.   Assessment/Plan:   Principal Problem:  CAP (community acquired pneumonia)  - In lingular region based on chest x-ray  - Continue Levaquin daily for additional 10 days on discharge  - Followup blood cultures - no growth to date Active Problems:  COPD exacerbation  - Continue nebulizer treatments at home as needed for shortness of breath and/or wheezing  - prednisone for 5 days on discahrge  - Continue treatment for pneumonia as above - COPD Gold alert and COPD order set in place  ADHD (attention deficit hyperactivity disorder)  - continue  adderall  Fibromyalgia  - continue cymbalta   Code Status: full code  Family Communication: No family at the bedside  Disposition Plan: Home today   Consultants:  None  Procedures:  None  Antibiotics:  Levaquin 11//13/2014 --> to be continued on discharge as noted above  Signed:  Manson Passey, MD  [facility] 11/16/2012, 12:08 PM  Pager #: [phone]   Discharge Exam: Filed Vitals:   11/16/12 0559  BP: 122/77  Pulse: 74  Temp: 97.8 F (36.6 C)  Resp: 18   Filed Vitals:   11/15/12 1329 11/15/12 2042 11/16/12 0559 11/16/12 0741  BP: 117/45 108/58 122/77   Pulse: 77 68 74   Temp: 98.4 F (36.9 C) 97.9 F (36.6 C) 97.8 F (36.6 C)   TempSrc: Oral Oral Oral   Resp: 16 16 18    Height:      Weight:      SpO2: 95% 97% 99% 98%    General: Pt is alert, follows commands appropriately, not in acute distress Cardiovascular: Regular rate and rhythm, S1/S2 +, no murmurs, no rubs, no gallops Respiratory:  Mild wheezing in upper lung lobes, no crackles  Abdominal: Soft, non tender, non distended, bowel sounds +, no guarding Extremities: no edema, no cyanosis, pulses palpable bilaterally DP and PT Neuro: Grossly nonfocal  Discharge Instructions  Discharge Orders   Future Appointments Provider Department Dept Phone   11/17/2012 10:10 AM [facility] [place] [facility] [phone]   Patient to arrive 15 minutes prior to appointment time.   Future Orders Complete By Expires   Call MD for:  difficulty breathing, headache or visual disturbances  As directed  Call MD for:  persistant dizziness or light-headedness  As directed    Call MD for:  persistant nausea and vomiting  As directed    Call MD for:  severe uncontrolled pain  As directed    Diet - low sodium heart healthy  As directed    Discharge instructions  As directed    Comments:     1. Take Levaquin for 10 days on discharge for pneumonia 2. Continue to take nebulizer  treatment at home every 4-6 hours as needed for shortness of breath or wheezing 3. Continue prednisone 50 mg a day for 5 days. 4. Follow up with PCP for obtaining CT chest per previously scheduled date.   Increase activity slowly  As directed        Medication List         acyclovir cream 5 %  Commonly known as:  ZOVIRAX  Apply 1 application topically every 3 (three) hours.     albuterol 108 (90 BASE) MCG/ACT inhaler  Commonly known as:  PROAIR HFA  Inhale 2 puffs into the lungs every 4 (four) hours as needed for wheezing or shortness of breath.     albuterol (5 MG/ML) 0.5% nebulizer solution  Commonly known as:  PROVENTIL  Take 0.5 mLs (2.5 mg total) by nebulization every 6 (six) hours as needed.     ALPRAZolam 1 MG tablet  Commonly known as:  XANAX  Take 1 tablet (1 mg total) by mouth 3 (three) times daily as needed.     amphetamine-dextroamphetamine 30 MG 24 hr capsule  Commonly known as:  ADDERALL XR  2 tablets qam and 1 q evening. BRAND MEDICALLY NECESSARY.     benzonatate 100 MG capsule  Commonly known as:  TESSALON  Take 1-2 capsules (100-200 mg total) by mouth 3 (three) times daily as needed for cough.     DSS 100 MG Caps  Take 100 mg by mouth 2 (two) times daily as needed for mild constipation.     DULoxetine 60 MG capsule  Commonly known as:  CYMBALTA  Take 1 capsule (60 mg total) by mouth 2 (two) times daily.     esomeprazole 40 MG capsule  Commonly known as:  NEXIUM  Take 1 capsule (40 mg total) by mouth 2 (two) times daily.     estrogen-methylTESTOSTERone 0.625-1.25 MG per tablet  Take 1 tablet by mouth daily.     fluticasone 50 MCG/ACT nasal spray  Commonly known as:  FLONASE  Place 2 sprays into the nose daily.     Fluticasone-Salmeterol 500-50 MCG/DOSE Aepb  Commonly known as:  ADVAIR  Inhale 1 puff into the lungs 2 (two) times daily.     guaiFENesin 600 MG 12 hr tablet  Commonly known as:  MUCINEX  Take 1 tablet (600 mg total) by mouth 2  (two) times daily as needed.     HYDROmorphone 2 MG tablet  Commonly known as:  DILAUDID  Take 1 tablet (2 mg total) by mouth every 3 (three) hours as needed for severe pain.     hydrOXYzine 25 MG tablet  Commonly known as:  ATARAX/VISTARIL  Take 0.5-1 tablets (12.5-25 mg total) by mouth every 8 (eight) hours as needed for itching.     levofloxacin 750 MG tablet  Commonly known as:  LEVAQUIN  Take 1 tablet (750 mg total) by mouth daily.     montelukast 10 MG tablet  Commonly known as:  SINGULAIR  Take 1 tablet (10 mg total) by  mouth 1 day or 1 dose.     ondansetron 4 MG tablet  Commonly known as:  ZOFRAN  Take 1 tablet (4 mg total) by mouth every 6 (six) hours as needed for nausea.     predniSONE 50 MG tablet  Commonly known as:  DELTASONE  Take for 5 days 50 mg a day     promethazine 25 MG suppository  Commonly known as:  PHENERGAN  Place 1 suppository (25 mg total) rectally every 6 (six) hours as needed for nausea.     valACYclovir 1000 MG tablet  Commonly known as:  VALTREX  Take 1 tablet (1,000 mg total) by mouth daily.     zolpidem 10 MG tablet  Commonly known as:  AMBIEN  Take 1-1.5 tablets (10-15 mg total) by mouth at bedtime as needed for sleep. May fill 60 days after date on prescription     zolpidem 10 MG tablet  Commonly known as:  AMBIEN  Take 1-1.5 tablets (10-15 mg total) by mouth at bedtime as needed.           Follow-up Information   Schedule an appointment as soon as possible for a visit with JEFFERY,CHELLE, PA-C.   Specialty:  Physician Assistant   Contact information:   [address] [place] [address] [phone]        The results of significant diagnostics from this hospitalization (including imaging, microbiology, ancillary and laboratory) are listed below for reference.    Significant Diagnostic Studies: Dg Chest 2 View  11/13/2012   CLINICAL DATA:  Cough and wheezing.  EXAM: CHEST  2 VIEW  COMPARISON:  PA and lateral chest  11/03/2012.  FINDINGS: There is airspace disease in the lingula compatible with pneumonia. The right lung is clear. Heart size is normal. No pneumothorax or pleural effusion.  IMPRESSION: Lingular pneumonia.   Electronically Signed   By: Drusilla Kanner M.D.   On: 11/13/2012 17:21   Dg Chest 2 View  11/03/2012   CLINICAL DATA:  Cough and shortness of breath. Wheezing.  EXAM: CHEST  2 VIEW  COMPARISON:  Chest x-rays dated 10/25/2011 and 10/20/2011 and chest CT dated 11/04/2008  FINDINGS: There is a 5 x 7 mm nodular density in the right midzone which I think lies anteriorly on the lateral view. The patient did have a 3 mm nodule in this area on a prior chest CT dated 11/04/2008. However, I cannot identify the nodule on the PA view on numerous prior chest x-rays.  Therefore, I recommend a follow-up the CT scan of the chest without contrast to determine whether the nodule has enlarged in the interval.  There is slight peribronchial thickening consistent with bronchitis. No consolidative infiltrates or effusions. No acute osseous abnormality. 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  IMPRESSION: Nodule in the right middle lobe which appears to be increased since CT scan of 11/04/2008. CT scan of the chest without contrast is recommended for further evaluation.  Bronchitic changes.   Electronically Signed   By: Geanie Cooley M.D.   On: 11/03/2012 16:34    Microbiology: Recent Results (from the past 240 hour(s))  CULTURE, BLOOD (ROUTINE X 2)     Status: None   Collection Time    11/13/12  6:15 PM      Result Value Range Status   Specimen Description BLOOD R HAND   Final   Special Requests Normal BOTTLES DRAWN AEROBIC AND ANAEROBIC 2CC   Final   Culture  Setup  Time     Final   Value: 11/14/2012 00:12     Performed at [facility]   Culture     Final   Value:        BLOOD CULTURE RECEIVED NO GROWTH TO DATE CULTURE WILL BE HELD FOR 5 DAYS BEFORE ISSUING A FINAL NEGATIVE REPORT      Performed at [facility]   Report Status PENDING   Incomplete  CULTURE, BLOOD (ROUTINE X 2)     Status: None   Collection Time    11/13/12  6:47 PM      Result Value Range Status   Specimen Description BLOOD RIGHT ANTECUBITAL   Final   Special Requests Normal BOTTLES DRAWN AEROBIC AND ANAEROBIC 5CC   Final   Culture  Setup Time     Final   Value: 11/14/2012 00:12     Performed at [facility]   Culture     Final   Value:        BLOOD CULTURE RECEIVED NO GROWTH TO DATE CULTURE WILL BE HELD FOR 5 DAYS BEFORE ISSUING A FINAL NEGATIVE REPORT     Performed at [facility]   Report Status PENDING   Incomplete     Labs: Basic Metabolic Panel:  Recent Labs Lab 11/13/12 1847 11/15/12 0403  NA 141 136  K 3.2* 4.5  CL 107 104  CO2 25 27  GLUCOSE 101* 231*  BUN 7 5*  CREATININE 0.43* 0.46*  CALCIUM 8.8 8.7   Liver Function Tests: No results found for this basename: AST, ALT, ALKPHOS, BILITOT, PROT, ALBUMIN,  in the last 168 hours No results found for this basename: LIPASE, AMYLASE,  in the last 168 hours No results found for this basename: AMMONIA,  in the last 168 hours CBC:  Recent Labs Lab 11/13/12 1547 11/15/12 0403  WBC 13.4* 20.9*  HGB 13.0 11.8*  HCT 42.7 37.0  MCV 98.2* 94.9  PLT  --  301   Cardiac Enzymes: No results found for this basename: CKTOTAL, CKMB, CKMBINDEX, TROPONINI,  in the last 168 hours BNP: BNP (last 3 results) No results found for this basename: PROBNP,  in the last 8760 hours CBG: No results found for this basename: GLUCAP,  in the last 168 hours  Time coordinating discharge: Over 30 minutes

## 2012-11-16 NOTE — Progress Notes (Signed)
Patient discharged to home with family, discharge instructions reviewed with patient who verbalized understanding and new RX's given to patient.

## 2012-11-16 NOTE — Progress Notes (Signed)
CSW received consult for COPD Gold Protocol -   CSW administered Depression Scale (patient scored 3/27) & Generalized Anxiety Disorder scale (patient scored 13/21).   Screening forms placed on shadow chart.   Patient states that she has been taking Xanax, Cymbalta, Adderall and Ambien which she feels has been helpful with her anxiety and depression.   Dr. Elisabeth Pigeon text-paged with screening results.   Patient states she is not amenable to psych consult, as she feels that her mental health needs are adequately met by her PCP.  Providence Crosby, LCSWA Clinical Social Work [phone]

## 2012-11-17 ENCOUNTER — Other Ambulatory Visit: Payer: BC Managed Care – PPO

## 2012-11-19 ENCOUNTER — Other Ambulatory Visit: Payer: BC Managed Care – PPO

## 2012-11-19 ENCOUNTER — Ambulatory Visit
Admission: RE | Admit: 2012-11-19 | Discharge: 2012-11-19 | Disposition: A | Discharge status: Home / Self Care | Payer: BC Managed Care – PPO | Source: Ambulatory Visit | Attending: Physician Assistant | Admitting: Physician Assistant

## 2012-11-19 ENCOUNTER — Other Ambulatory Visit: Payer: Self-pay | Admitting: Physician Assistant

## 2012-11-19 DIAGNOSIS — R9389 Abnormal findings on diagnostic imaging of other specified body structures: Secondary | ICD-10-CM

## 2012-11-20 ENCOUNTER — Telehealth: Payer: Self-pay

## 2012-11-20 ENCOUNTER — Other Ambulatory Visit: Payer: Self-pay | Admitting: Physician Assistant

## 2012-11-20 DIAGNOSIS — J189 Pneumonia, unspecified organism: Secondary | ICD-10-CM

## 2012-11-20 DIAGNOSIS — R9389 Abnormal findings on diagnostic imaging of other specified body structures: Secondary | ICD-10-CM

## 2012-11-20 LAB — CULTURE, BLOOD (ROUTINE X 2)
Culture: NO GROWTH
Special Requests: NORMAL

## 2012-11-20 NOTE — Telephone Encounter (Signed)
Please advise. Pended CT Scan to be done again in 3 months, per radiology:  I can call patient if needed.  No evidence of suspicious pulmonary nodule or mass.  2. Diffuse but slightly upper lobe predominant ground-glass  opacities. Given the clinical history, likely related to residual  infection, including atypical etiologies. However, neoplastic  processes cannot be entirely excluded. Per consensus criteria,  followup with chest CT at 3 months is recommended to confirm  persistence versus resolution. This recommendation follows the  consensus statement: Recommendations for the Management of Subsolid  Pulmonary Nodules Detected at CT: A Statement from the [employer]  [employer]ociety. Radiology 2013;266:304-317.  3. Coronary artery disease, markedly age advanced. Recommend  assessment of coronary risk factors and consideration of medical  therapy.  4. 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

## 2012-11-20 NOTE — Telephone Encounter (Signed)
Patient is calling to see if her ct results were in please call [phone]

## 2012-11-20 NOTE — Telephone Encounter (Signed)
Recently kept inpt for 3 nights due to pneumonia.  On 11/19 she had a CT scan which showed the following:  1. No evidence of suspicious pulmonary nodule or mass.  2. Diffuse but slightly upper lobe predominant ground-glass  opacities. Given the clinical history, likely related to residual  infection, including atypical etiologies. However, neoplastic  processes cannot be entirely excluded. Per consensus criteria,  followup with chest CT at 3 months is recommended to confirm  persistence versus resolution. This recommendation follows the  consensus statement: Recommendations for the Management of Subsolid  Pulmonary Nodules Detected at CT: A Statement from the Fleischner  Society. Radiology 2013;266:304-317.  3. Coronary artery disease, markedly age advanced. Recommend  assessment of coronary risk factors and consideration of medical  therapy.  4. 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  There is nothing to cause alarm (no direct evidence of cancer) but she does have some opacities in her upper lung likely due to recent infection.  Will schedule CT in 3 months for follow-up.  Also, her coronary arteries show plaque build-up.  Recommended that she come and see Chelle as soon as is convenient to discuss risk management (and fasting labs).

## 2012-11-23 ENCOUNTER — Telehealth: Payer: Self-pay | Admitting: Family Medicine

## 2012-11-23 DIAGNOSIS — I2510 Atherosclerotic heart disease of native coronary artery without angina pectoris: Secondary | ICD-10-CM

## 2012-11-23 NOTE — Telephone Encounter (Signed)
Message copied by Pearline Cables on Sun Nov 23, 2012 11:59 AM ------      Message from: Advance, [facility]      Created: Fri Nov 21, 2012  9:55 AM       Gave pt results of CT. As far as cardiologist goes, she would like you to refer her to whomever you choose. ------

## 2013-01-01 ENCOUNTER — Ambulatory Visit (INDEPENDENT_AMBULATORY_CARE_PROVIDER_SITE_OTHER): Payer: BC Managed Care – PPO | Admitting: Family Medicine

## 2013-01-01 ENCOUNTER — Ambulatory Visit: Payer: BC Managed Care – PPO

## 2013-01-01 VITALS — BP 126/78 | HR 92 | Temp 99.0°F | Resp 17 | Ht 62.5 in | Wt 138.0 lb

## 2013-01-01 DIAGNOSIS — S62308A Unspecified fracture of other metacarpal bone, initial encounter for closed fracture: Secondary | ICD-10-CM

## 2013-01-01 DIAGNOSIS — F909 Attention-deficit hyperactivity disorder, unspecified type: Secondary | ICD-10-CM

## 2013-01-01 DIAGNOSIS — S62309A Unspecified fracture of unspecified metacarpal bone, initial encounter for closed fracture: Secondary | ICD-10-CM

## 2013-01-01 DIAGNOSIS — M79641 Pain in right hand: Secondary | ICD-10-CM

## 2013-01-01 DIAGNOSIS — M79609 Pain in unspecified limb: Secondary | ICD-10-CM

## 2013-01-01 MED ORDER — AMPHETAMINE-DEXTROAMPHET ER 30 MG PO CP24: ORAL_CAPSULE | ORAL | Status: DC

## 2013-01-01 MED ORDER — HYDROCODONE-ACETAMINOPHEN 5-325 MG PO TABS: 1.0000 | ORAL_TABLET | Freq: Four times a day (QID) | ORAL | Status: DC | PRN

## 2013-01-01 NOTE — Progress Notes (Signed)
   Subjective:    Patient ID: Diana Benson, female    DOB: 1965/03/11, 48 y.o.   MRN: [id number]  PCP: Kasandra Fehr, PA-C  Chief Complaint  Patient presents with  . Hand Injury  . Arm Pain   Medications, allergies, past medical history, surgical history, family history, social history and problem list reviewed and updated.  HPI  On 12/28/2012 was moving her grandson (in his walker) and tripped.  As she fell, he rolled over her RIGHT hand and then she hit the same hand against a wooden cabinet. Pain and swelling.  Bruising in the hand. RIGHT hand dominant.  She needs new prescriptions for Adderall, which is working well for her at the current dose.  Her daughter, Alphonzo LemmingsWhitney, has been released from jail, where she was on prostitution charges.  Additionally, she got to see her son for the first time in 6 years.  It has been "the best Christmas EVER!"  Review of Systems As above.      Objective:   Physical Exam  Vitals reviewed. Constitutional: She is oriented to person, place, and time. Vital signs are normal. She appears well-developed and well-nourished. No distress.  HENT:  Head: Normocephalic and atraumatic.  Eyes: Conjunctivae are normal. Pupils are equal, round, and reactive to light.  Cardiovascular: Normal rate.   Pulmonary/Chest: Effort normal.  Musculoskeletal:       Right wrist: She exhibits decreased range of motion and tenderness. She exhibits no swelling and no effusion.       Right hand: She exhibits decreased range of motion, tenderness, bony tenderness and swelling. She exhibits normal capillary refill and no laceration. Normal sensation noted. Decreased strength noted.       Hands: Neurological: She is alert and oriented to person, place, and time. No sensory deficit.  Skin: Skin is warm and dry.  Psychiatric: She has a normal mood and affect. Her behavior is normal. Thought content normal.      Right Hand: [facility] reading (PRIMARY) by  Dr. Katrinka BlazingSmith. Spiral  fracture, mildly displaced, of the 5th metacarpal.      Assessment & Plan:  1. Hand pain, right - DG Hand Complete Right; Future  2. Closed fracture of 5th metacarpal, initial encounter Ulnar gutter splint and sling for immobilization. - Ambulatory referral to Hand Surgery - HYDROcodone-acetaminophen (NORCO) 5-325 MG per tablet; Take 1 tablet by mouth every 6 (six) hours as needed.  Dispense: 30 tablet; Refill: 0  3. ADHD (attention deficit hyperactivity disorder) Stable; controlled. Continue current treatment. - amphetamine-dextroamphetamine (ADDERALL XR) 30 MG 24 hr capsule; 2 tablets qam and 1 q evening. BRAND MEDICALLY NECESSARY.  Dispense: 90 capsule; Refill: 0   Fernande Brashelle S. Sudais Banghart, PA-C Physician Assistant-Certified [facility]

## 2013-01-01 NOTE — Patient Instructions (Signed)
If you have not heard anything regarding the referral in 5 days, please contact our office. Keep the splint on like it is a cast.  You may loosed the ACE wrap(s) if it begins to feel too tight. Keep the splint dry.

## 2013-01-01 NOTE — Progress Notes (Signed)
History and physical examinations reviewed in detail with Porfirio Oarhelle Jeffery, PA-C.  Hand xray reviewed.  Agree with A/P as outlined.

## 2013-01-09 ENCOUNTER — Ambulatory Visit (INDEPENDENT_AMBULATORY_CARE_PROVIDER_SITE_OTHER): Payer: BC Managed Care – PPO | Admitting: Cardiology

## 2013-01-09 ENCOUNTER — Encounter: Payer: Self-pay | Admitting: Cardiology

## 2013-01-09 VITALS — BP 136/62 | HR 80 | Ht 62.0 in | Wt 143.9 lb

## 2013-01-09 DIAGNOSIS — R252 Cramp and spasm: Secondary | ICD-10-CM | POA: Insufficient documentation

## 2013-01-09 DIAGNOSIS — R0609 Other forms of dyspnea: Secondary | ICD-10-CM

## 2013-01-09 DIAGNOSIS — F17209 Nicotine dependence, unspecified, with unspecified nicotine-induced disorders: Secondary | ICD-10-CM

## 2013-01-09 DIAGNOSIS — I2510 Atherosclerotic heart disease of native coronary artery without angina pectoris: Secondary | ICD-10-CM

## 2013-01-09 DIAGNOSIS — Z7189 Other specified counseling: Secondary | ICD-10-CM

## 2013-01-09 DIAGNOSIS — Z716 Tobacco abuse counseling: Secondary | ICD-10-CM

## 2013-01-09 DIAGNOSIS — Q211 Atrial septal defect: Secondary | ICD-10-CM

## 2013-01-09 DIAGNOSIS — Q2112 Patent foramen ovale: Secondary | ICD-10-CM

## 2013-01-09 DIAGNOSIS — R0989 Other specified symptoms and signs involving the circulatory and respiratory systems: Secondary | ICD-10-CM

## 2013-01-09 DIAGNOSIS — I639 Cerebral infarction, unspecified: Secondary | ICD-10-CM

## 2013-01-09 DIAGNOSIS — F17200 Nicotine dependence, unspecified, uncomplicated: Secondary | ICD-10-CM

## 2013-01-09 DIAGNOSIS — Q2111 Secundum atrial septal defect: Secondary | ICD-10-CM

## 2013-01-09 DIAGNOSIS — I63239 Cerebral infarction due to unspecified occlusion or stenosis of unspecified carotid arteries: Secondary | ICD-10-CM

## 2013-01-09 DIAGNOSIS — I6350 Cerebral infarction due to unspecified occlusion or stenosis of unspecified cerebral artery: Secondary | ICD-10-CM

## 2013-01-09 DIAGNOSIS — R0789 Other chest pain: Secondary | ICD-10-CM

## 2013-01-09 HISTORY — DX: Atherosclerotic heart disease of native coronary artery without angina pectoris: I25.10

## 2013-01-09 HISTORY — DX: Patent foramen ovale: Q21.12

## 2013-01-09 HISTORY — DX: Nicotine dependence, unspecified, with unspecified nicotine-induced disorders: F17.209

## 2013-01-09 HISTORY — DX: Atrial septal defect: Q21.1

## 2013-01-09 NOTE — Assessment & Plan Note (Addendum)
The chest pain that she is describing sounds very much like a fibromyalgia or musculoskeletal related.  However with the CT scan findings, and her basically was equivalent with carotid disease, it is reasonable to assess for any ischemic etiology with Myoview stress test.

## 2013-01-09 NOTE — Assessment & Plan Note (Signed)
No residual effect. Follow Carotid Dopplers as noted above.

## 2013-01-09 NOTE — Progress Notes (Signed)
Diana Benson: Tyrese Stilley MRN: [id number] DOB: October 05, 1965 PCP: Olene FlossJEFFERY,CHELLE, PA-C  Clinic Note: Chief Complaint  Patient presents with  . New Evaluation    Dr. Patsy Lageropland referral; had pneumona & CT scan - heart disease; reports chest pain (allergies, anxiety), DOE and occ SOB while at rest and when asleep (will wake up gasping), cramping in feet/legs; reports "hole in heart" in 12/2006 (when had stroke) - worked at [employer]   HPI: Diana Amslerraci Green is a 48 y.o. female with a PMH below who presents today for initial cardiology evaluation following CT scan showing possible coronary disease. She was recovering from the recent bout of pneumonia back in November of 2014, and had a CT scan of the chest performed which showed coronary artery calcification of left main and LAD as well as RCA. She has a history of right-sided stroke (December 2008) from left carotid "kink "with carotid endarterectomy back in December 2000 at at the [employer] [facility] [employer], which incidentally was where she was working at the time. She had an echocardiogram at that time and was told she had the echo that showed she had a "hole in the heart".   She has a history of being severely obese, and underwent Gastric Bypass Roux-en-Y [place] in [place] in [place], and has lost over 150 pounds. She moved back to [place] after her stroke, in April 2009, as part of her divorce and to get family support for her recovery. She is recovered fully from her stroke.  Interval History: She was referred after the CT scan showed coronary plaque/calcification. She also notes pretty much chronic exertional dyspnea. She says she short of breath all the time since moving into this house. She short of breath lying flat waking up, it not sure she would be able to do a treadmill stress test because of her dyspnea. She strictly wheezing and is using several allergy medications including inhaled steroid.  She does note occasional left-sided  parasternal and inframmary pain that is made worse with deep inspiration and cough. She does have occasional palpitations but nothing that would suggest a rapid or irregular rhythm. No syncope or near CB, but she does have some positional lightheadedness. He does get somewhat lightheaded when she is coughing a lot. She denies any recurrent TIA or amaurosis fugax symptoms. No melena, hematochezia or hematuria. No claudication or nosebleeds  Past Medical History  Diagnosis Date  . COPD (chronic obstructive pulmonary disease) with chronic bronchitis   . CVA (cerebral vascular accident) December 2008    Status post left-sided CAE  . GERD (gastroesophageal reflux disease)   . Asthma   . ADHD (attention deficit hyperactivity disorder)   . Anxiety   . Depression   . Fibromyalgia   . Tobacco use disorder, continuous 01/09/2013  . PFO (patent foramen ovale) 01/09/2013  . Coronary artery calcification seen on CAT scan 01/09/2013    Prior Cardiac Evaluation and Past Surgical History: Past Surgical History  Procedure Laterality Date  . Appendectomy    . Abdominal hysterectomy    . Gastric bypass  05/21/2001    Roux-en-Y; in [place]  . Tonsillectomy    . Cosmetic surgery      Post GOP  . Carotid endarterectomy Left December 2008    [facility]    Allergies  Allergen Reactions  . Codeine Nausea And Vomiting  . Flexeril [Cyclobenzaprine] Other (See Comments)    "Makes me feel like I'm coming out of my skin."  . Morphine And Related  Nausea And Vomiting  . Venlafaxine Nausea Only    Current Outpatient Prescriptions  Medication Sig Dispense Refill  . acyclovir cream (ZOVIRAX) 5 % Apply 1 application topically every 3 (three) hours.  15 g  5  . albuterol (PROAIR HFA) 108 (90 BASE) MCG/ACT inhaler Inhale 2 puffs into the lungs every 4 (four) hours as needed for wheezing or shortness of breath.  18 each  5  . albuterol (PROVENTIL) (5 MG/ML) 0.5% nebulizer solution Take 0.5 mLs (2.5 mg  total) by nebulization every 6 (six) hours as needed.  20 mL  5  . ALPRAZolam (XANAX) 1 MG tablet Take 1 tablet (1 mg total) by mouth 3 (three) times daily as needed.  90 tablet  0  . amphetamine-dextroamphetamine (ADDERALL XR) 30 MG 24 hr capsule 2 tablets qam and 1 q evening. BRAND MEDICALLY NECESSARY.  90 capsule  0  . CALCIUM PO Take 2 tablets by mouth daily.      . DULoxetine (CYMBALTA) 60 MG capsule Take 1 capsule (60 mg total) by mouth 2 (two) times daily.  60 capsule  5  . esomeprazole (NEXIUM) 40 MG capsule Take 1 capsule (40 mg total) by mouth 2 (two) times daily.  60 capsule  5  . estrogen-methylTESTOSTERone 0.625-1.25 MG per tablet Take 1 tablet by mouth daily.  30 tablet  5  . fluticasone (FLONASE) 50 MCG/ACT nasal spray Place 2 sprays into the nose daily.  16 g  12  . Fluticasone-Salmeterol (ADVAIR) 500-50 MCG/DOSE AEPB Inhale 1 puff into the lungs 2 (two) times daily.  60 each  12  . montelukast (SINGULAIR) 10 MG tablet Take 1 tablet (10 mg total) by mouth 1 day or 1 dose.  30 tablet  12  . valACYclovir (VALTREX) 1000 MG tablet Take 1 tablet (1,000 mg total) by mouth daily.  90 tablet  12  . zolpidem (AMBIEN) 10 MG tablet Take 1-1.5 tablets (10-15 mg total) by mouth at bedtime as needed.  30 tablet  0   No current facility-administered medications for this visit.    History   Social History Narrative   Divorced mother of 2 (son and daughter) grandmother of one. She is actually the primary caregiver for her grandson. She is a long-term smoker who quit for some time, and then started back again after her divorce. She is currently actively trying to stop, having cut from one and a half pack per day down to maybe 3-4 cigarettes a day.      Son (age 51) living with Dad in [place]. Almost no contact, despite her efforts, for several years.  Struggling with behavior, alcohol, etc., and realizing that his father has not been entirely honest with him about the patient.      Daughter  (age 48) has had significant issues with substance abuse and prostitution, currently "on the run."  Son born 12/18/2011, is living with the patient, who filed for custody of him 10/31/2012.       She is currently living with her mother, who has several cats. She is extremely allergic to cats and is not having significant respiratory problems since moving into the house.   Family History: family history includes Acute myelogenous leukemia in her paternal grandfather; Alzheimer's disease in her paternal grandmother; Drug abuse in her daughter; Heart Problems in her brother; Hypertension in her mother; Osteoporosis in her mother; Stroke in her father; Transient ischemic attack in her paternal grandmother.  ROS: A comprehensive Review of Systems - Negative except  Symptoms noted above in history of present illness as well as noncardiac symptoms below Allergy and Immunology ROS: positive for - itchy/watery eyes, nasal congestion, seasonal allergies and Wheezing. Likely related to cat allergy Respiratory ROS: positive for - cough, orthopnea, pleuritic pain, shortness of breath and wheezing Musculoskeletal ROS: positive for - Significant episodes of hand, or foot/leg cramping is extremely debilitating. she also notes occasional hip and leg pain mostly on the right.  PHYSICAL EXAM BP 136/62  Pulse 80  Ht 5\' 2"  (1.575 m)  Wt 143 lb 14.4 oz (65.273 kg)  BMI 26.31 kg/m2 General appearance: alert, cooperative, appears stated age, no distress and Well-nourished, well-groomed. Answers questions appropriately. Normal mood and affect are HEENT: [place]/AT, EOMI, MMM, anicteric sclera Neck: no carotid bruit, no JVD and supple, symmetrical, trachea midline Lungs: Mostly CTA B., with faint interstitial sounds at the bases. Very infrequent end expiratory wheezing. Otherwise normal respiratory effort. She does cough with deep inspiration. Heart: regular rate and rhythm, S1, S2 normal;  soft 1/6 SEM at RUSB;  no click,  rub or gallop and normal apical impulse Abdomen: soft, non-tender; bowel sounds normal; no masses,  no organomegaly Extremities: extremities normal, atraumatic, no cyanosis or edema Pulses: 2+ and symmetric Neurologic: Alert and oriented X 3, normal strength and tone. Normal symmetric reflexes. Normal coordination and gait  BJY:NWGNFAOZH today: Yes Rate: 80 , Rhythm: NSR; normal EKG;  Recent Labs: November 2014 labs reviewed on Epic  ASSESSMENT / PLAN: Interesting story of a middle-aged woman, relatively young-appearing who is 12 years status post gastric bypass surgery after long-standing obesity, with a history of left carotid endarterectomy following a stroke, and reported ": The heart "which is most likely a PFO. She is now found to have coronary calcification on the CT scan. She does have chronic dyspnea which is worse with exertion, and has had some atypical chest discomfort symptoms.  Coronary artery calcification seen on CAT scan This may very well be an incidental finding, however given her history of stroke with carotid endarterectomy, she clearly has had atherosclerotic disease.  Although she does not seem to be obese now, this is the result of gastric bypass surgery, prior to which he was morbidly obese, and therefore had significant right risk factors.  The presence of coronary calcification on CT scan would suggest active coronary disease, and therefore would warrant aggressive risk factor modification. In the setting of chronic dyspnea worse with exertion, and chest discomfort, albeit somewhat atypical, we'll need to rule out ischemic coronary disease.  Plan: Risk Stratification  Lipid panel, BMP and hepatic panel.  Exercise Myoview (she is not sure if she is able to actually go long enough on the treadmill for diagnostic purposes due to her dyspnea -- the lab opted for an imaging study)  Unless grossly abnormal lipids, would tend to treat with diet and exercise as opposed to  statin given her current cramping discomfort. Would not want infusion of possible statin side effect with her ongoing symptoms.  Occlusion and stenosis of carotid artery with cerebral infarction As far as I can tell, she has not had any followup Carotid Dopplers while here [facility]. Once we have established her remaining cardiovascular risk factors, will need to schedule Carotid Dopplers for new baseline. She is not on aspirin or Plavix. Again we would probably recommend at least aspirin 81 mg regardless of results of the stress test.  Stroke, history of No residual effect. Follow Carotid Dopplers as noted above.  PFO (patent foramen ovale)  As best I can tell, the most likely diagnosis that would go along with "hole in the heart "in someone her age would be PFO.  I would like to get her echocardi[facility] from [facility], but would also like to get a baseline echocardiogram here with bubble study to determine the extent of her PFO versus possible ASD. I do not hear significant murmur or fixed-split S2 on exam, but she did have a soft systolic murmur that could be from increased pulmonic flow.  Atypical chest pain The chest pain that she is describing sounds very much like a fibromyalgia or musculoskeletal related.  However with the CT scan findings, and her basically was equivalent with carotid disease, it is reasonable to assess for any ischemic etiology with Myoview stress test.  Exertional dyspnea Probably related to COPD and allergies. Will rule out coronary ischemia with Myoview.  Tobacco use disorder, continuous I did spend at least 4-5 minutes discussing importance of cessation especially with her allergy condition and the diagnosis of COPD. She is currently had some pulmonary complications with the bout of pneumonia back in November. She does not currently think she is having an brief "bronchitis "spell. She is actively trying to quit, does not finding it as easy as it was  last time she did so.  Cramping of hands Not sure what's causing this. I will go ahead and check calcium magnesium levels along with the rest of her lab studies.    Orders Placed This Encounter  Procedures  . Lipid panel  . Hepatic function panel  . Basic metabolic panel  . Calcium  . Magnesium  . Myocardial Perfusion Imaging    Standing Status: Future     Number of Occurrences:      Standing Expiration Date: 01/09/2014    Scheduling Instructions:     May convert to [facility] -- pt not sure if she will be able to reach target HR.    Order Specific Question:  Where should this test be performed    Answer:  [facility]    Order Specific Question:  Type of stress    Answer:  Exercise    Order Specific Question:  Patient weight in lbs    Answer:  138  . EKG 12-Lead  . 2D Echocardiogram with bubble study    Standing Status: Future     Number of Occurrences:      Standing Expiration Date: 01/09/2014    Scheduling Instructions:     H/O PFO    Order Specific Question:  Type of Echo    Answer:  Complete    Order Specific Question:  Where should this test be performed    Answer:  [facility]    Order Specific Question:  Reason for exam-Echo    Answer:  Other - See Comments Section   Meds ordered this encounter  Medications  . CALCIUM PO    Sig: Take 2 tablets by mouth daily.    Followup: 3-4 weeks  Jacquis Paxton W. Herbie Baltimore, M.D., M.S. [facility] [address] [place], [place]  [address]  [phone] Pager # [phone]

## 2013-01-09 NOTE — Assessment & Plan Note (Signed)
Probably related to COPD and allergies. Will rule out coronary ischemia with Myoview.

## 2013-01-09 NOTE — Assessment & Plan Note (Signed)
As best I can tell, the most likely diagnosis that would go along with "hole in the heart "in someone her age would be PFO.  I would like to [facility], but would also like to get a baseline echocardiogram here with bubble study to determine the extent of her PFO versus possible ASD. I do not hear significant murmur or fixed-split S2 on exam, but she did have a soft systolic murmur that could be from increased pulmonic flow.

## 2013-01-09 NOTE — Patient Instructions (Signed)
Labs BMP LIPID LIVER PANEL,CALCIUM,MAGNESIUM  Your physician has requested that you have an echocardiogram. Echocardiography is a painless test that uses sound waves to create images of your heart. It provides your doctor with information about the size and shape of your heart and how well your heart's chambers and valves are working. This procedure takes approximately one hour. There are no restrictions for this procedure.WITH A BUBBLE STUDY   Your physician has requested that you have en exercise stress myoview. For further information please visit [email or web address]. Please follow instruction sheet, as given.  Your physician recommends that you schedule a follow-up appointment in AFTER ALL TES ARE COMPLETE.

## 2013-01-09 NOTE — Assessment & Plan Note (Signed)
This may very well be an incidental finding, however given her history of stroke with carotid endarterectomy, she clearly has had atherosclerotic disease.  Although she does not seem to be obese now, this is the result of gastric bypass surgery, prior to which he was morbidly obese, and therefore had significant right risk factors.  The presence of coronary calcification on CT scan would suggest active coronary disease, and therefore would warrant aggressive risk factor modification. In the setting of chronic dyspnea worse with exertion, and chest discomfort, albeit somewhat atypical, we'll need to rule out ischemic coronary disease.  Plan: Risk Stratification  Lipid panel, BMP and hepatic panel.  Exercise Myoview (she is not sure if she is able to actually go long enough on the treadmill for diagnostic purposes due to her dyspnea -- the lab opted for an imaging study)  Unless grossly abnormal lipids, would tend to treat with diet and exercise as opposed to statin given her current cramping discomfort. Would not want infusion of possible statin side effect with her ongoing symptoms.

## 2013-01-09 NOTE — Assessment & Plan Note (Addendum)
As far as I can tell, she has not had any followup Carotid Dopplers while here [facility]. Once we have established her remaining cardiovascular risk factors, will need to schedule Carotid Dopplers for new baseline. She is not on aspirin or Plavix. Again we would probably recommend at least aspirin 81 mg regardless of results of the stress test.

## 2013-01-09 NOTE — Assessment & Plan Note (Signed)
Not sure what's causing this. I will go ahead and check calcium magnesium levels along with the rest of her lab studies.

## 2013-01-09 NOTE — Assessment & Plan Note (Signed)
I did spend at least 4-5 minutes discussing importance of cessation especially with her allergy condition and the diagnosis of COPD. She is currently had some pulmonary complications with the bout of pneumonia back in November. She does not currently think she is having an brief "bronchitis "spell. She is actively trying to quit, does not finding it as easy as it was last time she did so.

## 2013-01-12 ENCOUNTER — Telehealth (HOSPITAL_COMMUNITY): Payer: Self-pay | Admitting: *Deleted

## 2013-01-12 ENCOUNTER — Encounter: Payer: Self-pay | Admitting: Cardiology

## 2013-02-01 ENCOUNTER — Other Ambulatory Visit: Payer: Self-pay | Admitting: Physician Assistant

## 2013-02-03 NOTE — Telephone Encounter (Signed)
faxed

## 2013-02-04 ENCOUNTER — Telehealth (HOSPITAL_COMMUNITY): Payer: Self-pay | Admitting: *Deleted

## 2013-02-23 ENCOUNTER — Encounter: Payer: Self-pay | Admitting: Radiology

## 2013-03-03 ENCOUNTER — Telehealth: Payer: Self-pay | Admitting: Radiology

## 2013-03-03 NOTE — Telephone Encounter (Signed)
I have sent an appeal for the denial of the CT scan, of the c

## 2013-03-05 NOTE — Telephone Encounter (Signed)
If you hear anything back from this appeal, please let me know

## 2013-03-08 ENCOUNTER — Other Ambulatory Visit: Payer: Self-pay | Admitting: Physician Assistant

## 2013-03-09 ENCOUNTER — Other Ambulatory Visit: Payer: Self-pay

## 2013-04-07 ENCOUNTER — Ambulatory Visit: Payer: BC Managed Care – PPO | Admitting: Physician Assistant

## 2013-04-17 ENCOUNTER — Other Ambulatory Visit: Payer: Self-pay | Admitting: Physician Assistant

## 2013-04-20 ENCOUNTER — Other Ambulatory Visit: Payer: Self-pay | Admitting: Physician Assistant

## 2013-04-20 NOTE — Telephone Encounter (Signed)
faxed

## 2013-04-21 NOTE — Telephone Encounter (Signed)
Faxed

## 2013-04-22 ENCOUNTER — Other Ambulatory Visit: Payer: Self-pay | Admitting: *Deleted

## 2013-04-22 MED ORDER — ACYCLOVIR 5 % EX CREA: TOPICAL_CREAM | CUTANEOUS | Status: DC

## 2013-05-07 ENCOUNTER — Telehealth: Payer: Self-pay

## 2013-05-07 DIAGNOSIS — F909 Attention-deficit hyperactivity disorder, unspecified type: Secondary | ICD-10-CM

## 2013-05-07 MED ORDER — AMPHETAMINE-DEXTROAMPHET ER 30 MG PO CP24: ORAL_CAPSULE | ORAL | Status: DC

## 2013-05-07 NOTE — Telephone Encounter (Signed)
Rx Printed.  Meds ordered this encounter  Medications  . amphetamine-dextroamphetamine (ADDERALL XR) 30 MG 24 hr capsule    Sig: 2 tablets qam and 1 q evening. BRAND MEDICALLY NECESSARY.    Dispense:  90 capsule    Refill:  0    Order Specific Question:  Supervising Provider    Answer:  DOOLITTLE, ROBERT P [[id number]]

## 2013-05-07 NOTE — Telephone Encounter (Signed)
Pt is completely out of adderall and needs a refill until her appt on 6/16 with chelle.  Please call [phone]

## 2013-05-08 NOTE — Telephone Encounter (Signed)
Pt.notified

## 2013-06-12 ENCOUNTER — Telehealth: Payer: Self-pay

## 2013-06-12 DIAGNOSIS — F909 Attention-deficit hyperactivity disorder, unspecified type: Secondary | ICD-10-CM

## 2013-06-12 DIAGNOSIS — F411 Generalized anxiety disorder: Secondary | ICD-10-CM

## 2013-06-12 MED ORDER — AMPHETAMINE-DEXTROAMPHET ER 30 MG PO CP24: ORAL_CAPSULE | ORAL | Status: DC

## 2013-06-12 MED ORDER — ALPRAZOLAM 1 MG PO TABS: 1.0000 mg | ORAL_TABLET | Freq: Three times a day (TID) | ORAL | Status: DC | PRN

## 2013-06-12 NOTE — Telephone Encounter (Signed)
Pt would like to have a refill on adderall and xanax, her appt is not until 06/16/13 and she just took her last pill today. Best# [phone]

## 2013-06-12 NOTE — Telephone Encounter (Signed)
Medications refilled for Diana Benson.

## 2013-06-12 NOTE — Telephone Encounter (Signed)
Pt notified that these are ready for p/u

## 2013-06-15 ENCOUNTER — Other Ambulatory Visit: Payer: Self-pay | Admitting: Physician Assistant

## 2013-06-16 ENCOUNTER — Encounter: Payer: Self-pay | Admitting: Physician Assistant

## 2013-06-16 ENCOUNTER — Ambulatory Visit (INDEPENDENT_AMBULATORY_CARE_PROVIDER_SITE_OTHER): Payer: BC Managed Care – PPO

## 2013-06-16 ENCOUNTER — Ambulatory Visit (INDEPENDENT_AMBULATORY_CARE_PROVIDER_SITE_OTHER): Payer: BC Managed Care – PPO | Admitting: Physician Assistant

## 2013-06-16 VITALS — BP 112/70 | HR 74 | Temp 97.9°F | Resp 16 | Ht 62.0 in | Wt 145.2 lb

## 2013-06-16 DIAGNOSIS — J45909 Unspecified asthma, uncomplicated: Secondary | ICD-10-CM

## 2013-06-16 DIAGNOSIS — J309 Allergic rhinitis, unspecified: Secondary | ICD-10-CM

## 2013-06-16 DIAGNOSIS — G4700 Insomnia, unspecified: Secondary | ICD-10-CM

## 2013-06-16 DIAGNOSIS — F341 Dysthymic disorder: Secondary | ICD-10-CM

## 2013-06-16 DIAGNOSIS — Z79890 Hormone replacement therapy (postmenopausal): Secondary | ICD-10-CM

## 2013-06-16 DIAGNOSIS — M5412 Radiculopathy, cervical region: Secondary | ICD-10-CM

## 2013-06-16 DIAGNOSIS — M549 Dorsalgia, unspecified: Secondary | ICD-10-CM

## 2013-06-16 DIAGNOSIS — F329 Major depressive disorder, single episode, unspecified: Secondary | ICD-10-CM

## 2013-06-16 DIAGNOSIS — M5410 Radiculopathy, site unspecified: Secondary | ICD-10-CM

## 2013-06-16 DIAGNOSIS — K219 Gastro-esophageal reflux disease without esophagitis: Secondary | ICD-10-CM

## 2013-06-16 DIAGNOSIS — F419 Anxiety disorder, unspecified: Secondary | ICD-10-CM

## 2013-06-16 DIAGNOSIS — F411 Generalized anxiety disorder: Secondary | ICD-10-CM

## 2013-06-16 DIAGNOSIS — F909 Attention-deficit hyperactivity disorder, unspecified type: Secondary | ICD-10-CM

## 2013-06-16 DIAGNOSIS — B009 Herpesviral infection, unspecified: Secondary | ICD-10-CM

## 2013-06-16 MED ORDER — EST ESTROGENS-METHYLTEST 0.625-1.25 MG PO TABS: ORAL_TABLET | ORAL | Status: DC

## 2013-06-16 MED ORDER — FLUTICASONE PROPIONATE 50 MCG/ACT NA SUSP: NASAL | Status: DC

## 2013-06-16 MED ORDER — AMPHETAMINE-DEXTROAMPHET ER 30 MG PO CP24: ORAL_CAPSULE | ORAL | Status: DC

## 2013-06-16 MED ORDER — VALACYCLOVIR HCL 1 G PO TABS: 1000.0000 mg | ORAL_TABLET | Freq: Every day | ORAL | Status: DC

## 2013-06-16 MED ORDER — ZOLPIDEM TARTRATE 10 MG PO TABS: 10.0000 mg | ORAL_TABLET | Freq: Every evening | ORAL | Status: DC | PRN

## 2013-06-16 MED ORDER — FLUTICASONE-SALMETEROL 500-50 MCG/DOSE IN AEPB: 1.0000 | INHALATION_SPRAY | Freq: Two times a day (BID) | RESPIRATORY_TRACT | Status: DC

## 2013-06-16 MED ORDER — DULOXETINE HCL 60 MG PO CPEP: 60.0000 mg | ORAL_CAPSULE | Freq: Two times a day (BID) | ORAL | Status: DC

## 2013-06-16 MED ORDER — ALBUTEROL SULFATE HFA 108 (90 BASE) MCG/ACT IN AERS: 2.0000 | INHALATION_SPRAY | RESPIRATORY_TRACT | Status: DC | PRN

## 2013-06-16 MED ORDER — ESOMEPRAZOLE MAGNESIUM 40 MG PO CPDR: 40.0000 mg | DELAYED_RELEASE_CAPSULE | Freq: Two times a day (BID) | ORAL | Status: DC

## 2013-06-16 MED ORDER — ALPRAZOLAM 1 MG PO TABS: 1.0000 mg | ORAL_TABLET | Freq: Three times a day (TID) | ORAL | Status: DC | PRN

## 2013-06-16 MED ORDER — MONTELUKAST SODIUM 10 MG PO TABS: 10.0000 mg | ORAL_TABLET | ORAL | Status: DC

## 2013-06-16 MED ORDER — ACYCLOVIR 5 % EX OINT: 1.0000 "application " | TOPICAL_OINTMENT | CUTANEOUS | Status: DC

## 2013-06-16 MED ORDER — CARISOPRODOL 350 MG PO TABS: 350.0000 mg | ORAL_TABLET | Freq: Four times a day (QID) | ORAL | Status: DC

## 2013-06-16 NOTE — Progress Notes (Signed)
Subjective:    Patient ID: Diana Benson, female    DOB: 1965/06/11, 48 y.o.   MRN: [id number]   PCP: JEFFERY,CHELLE, PA-C  Chief Complaint  Patient presents with  . Medication Refill  . Back Pain    up in the shoulder area for the past few weeks    Medications, allergies, past medical history, surgical history, family history, social history and problem list reviewed and updated.  Patient Active Problem List   Diagnosis Date Noted  . Coronary artery calcification seen on CAT scan 01/09/2013  . Tobacco use disorder, continuous 01/09/2013  . Cramping of hands 01/09/2013  . Occlusion and stenosis of carotid artery with cerebral infarction 01/09/2013  . PFO (patent foramen ovale) 01/09/2013  . Exertional dyspnea 01/09/2013  . Atypical chest pain 01/09/2013  . CAP (community acquired pneumonia) 11/13/2012  . Asthma 01/19/2011  . Anxiety and depression 01/19/2011  . ADHD (attention deficit hyperactivity disorder) 01/19/2011  . Stroke, history of 01/19/2011    Class: History of  . Fibromyalgia 01/19/2011    Prior to Admission medications   Medication Sig Start Date End Date Taking? Authorizing [place]  albuterol (PROAIR HFA) 108 (90 BASE) MCG/ACT inhaler Inhale 2 puffs into the lungs every 4 (four) hours as needed for wheezing or shortness of breath. 06/16/13  Yes Chelle S Jeffery, PA-C  albuterol (PROVENTIL) (5 MG/ML) 0.5% nebulizer solution Take 0.5 mLs (2.5 mg total) by nebulization every 6 (six) hours as needed. 09/18/12  Yes Chelle S Jeffery, PA-C  ALPRAZolam (XANAX) 1 MG tablet Take 1 tablet (1 mg total) by mouth 3 (three) times daily as needed for anxiety. 06/16/13  Yes Chelle S Jeffery, PA-C  amphetamine-dextroamphetamine (ADDERALL XR) 30 MG 24 hr capsule 2 tablets qam and 1 q evening. BRAND MEDICALLY NECESSARY. 06/16/13  Yes Chelle S Jeffery, PA-C  CALCIUM PO Take 2 tablets by mouth daily.   Yes Historical Aeriel Boulay, MD  DULoxetine (CYMBALTA) 60 MG capsule Take 1 capsule (60  mg total) by mouth 2 (two) times daily. 06/16/13  Yes Chelle S Jeffery, PA-C  esomeprazole (NEXIUM) 40 MG capsule Take 1 capsule (40 mg total) by mouth 2 (two) times daily. 06/16/13  Yes Chelle Tessa LernerS Jeffery, PA-C  estrogen-methylTESTOSTERone (EST ESTROGENS-METHYLTEST HS) 0.625-1.25 MG per tablet Take 1 tablet daily 06/16/13  Yes Chelle S Jeffery, PA-C  fluticasone (FLONASE) 50 MCG/ACT nasal spray PLACE 2 SPRAYS IN each nostril DAILY 06/16/13  Yes Chelle S Jeffery, PA-C  Fluticasone-Salmeterol (ADVAIR) 500-50 MCG/DOSE AEPB Inhale 1 puff into the lungs 2 (two) times daily. 06/16/13  Yes Chelle S Jeffery, PA-C  montelukast (SINGULAIR) 10 MG tablet Take 1 tablet (10 mg total) by mouth 1 day or 1 dose. 06/16/13  Yes Chelle S Jeffery, PA-C  valACYclovir (VALTREX) 1000 MG tablet Take 1 tablet (1,000 mg total) by mouth daily. 06/16/13  Yes Chelle S Jeffery, PA-C  zolpidem (AMBIEN) 10 MG tablet Take 1-1.5 tablets (10-15 mg total) by mouth at bedtime as needed. 06/16/13  Yes Chelle S Jeffery, PA-C  acyclovir ointment (ZOVIRAX) 5 % Apply 1 application topically every 3 (three) hours. Apply 6x/day x 7 days, prn 06/16/13   Fernande Brashelle S Jeffery, PA-C    HPI  Presents for refills of her chronic medications, but also reports pain in the upper back and shoulders for the past several weeks.  Makes it hard to sleep. No trauma/injury. OTC pain relievers ineffective. Intolerant to flexeril and robaxin.  Other than the back pain, she's been doing really well.  Her breathing has been good, her mood is stable.  She's enjoying her grandson, but hasn't spoken with her daughter in several months.  Review of Systems As above.    Objective:   Physical Exam  Constitutional: She is oriented to person, place, and time. She appears well-developed and well-nourished. She is active and cooperative. No distress.  BP 112/70  Pulse 74  Temp(Src) 97.9 F (36.6 C) (Oral)  Resp 16  Ht 5\' 2"  (1.575 m)  Wt 145 lb 3.2 oz (65.862 kg)  BMI 26.55  kg/m2  SpO2 97%   Eyes: Conjunctivae are normal. No scleral icterus.  Neck: No thyromegaly present.  Cardiovascular: Normal rate, regular rhythm and normal heart sounds.   Pulmonary/Chest: Effort normal and breath sounds normal.  Musculoskeletal:       Cervical back: She exhibits tenderness and bony tenderness. She exhibits normal range of motion (but pain in the toracic back with ROM of the cspine).       Thoracic back: She exhibits decreased range of motion, tenderness and bony tenderness.       Lumbar back: Normal.  Tenderness of the cervical, thoracic and lumbar paraspinous muscles and trapezius bilaterally.  Lymphadenopathy:    She has no cervical adenopathy.  Neurological: She is alert and oriented to person, place, and time.  Skin: Skin is warm and dry.  Psychiatric: She has a normal mood and affect. Her behavior is normal.      Cspine: [facility] reading (PRIMARY) by  Dr. Neva SeatGreene. Diffuse degenerative changes, worst at C6-7, where there is disc space narrowing and spondylosis.  t-spine: [facility] reading (PRIMARY) by  Dr. Neva SeatGreene. Mild incidental scoliosis, otherwise normal.      Assessment & Plan:  1. Back pain 2. 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 If symptoms persist, will refer to ortho.  - carisoprodol (SOMA) 350 MG tablet; Take 1 tablet (350 mg total) by mouth 4 (four) times daily.  Dispense: 40 tablet; Refill: 0 - DG Thoracic Spine 2 View; Future - DG Cervical Spine 2 or 3 views; Future  3. Asthma Stable, controlled. - albuterol (PROAIR HFA) 108 (90 BASE) MCG/ACT inhaler; Inhale 2 puffs into the lungs every 4 (four) hours as needed for wheezing or shortness of breath.  Dispense: 18 each; Refill: 5 - Fluticasone-Salmeterol (ADVAIR) 500-50 MCG/DOSE AEPB; Inhale 1 puff into the lungs 2 (two) times daily.  Dispense: 60 each; Refill: 12  4. Anxiety state, unspecified Stable, controlled. - ALPRAZolam (XANAX) 1 MG tablet;  Take 1 tablet (1 mg total) by mouth 3 (three) times daily as needed for anxiety.  Dispense: 90 tablet; Refill: 0 - ALPRAZolam (XANAX) 1 MG tablet; Take 1 tablet (1 mg total) by mouth 3 (three) times daily as needed for anxiety. May fill 30 days after date on prescription.  Dispense: 90 tablet; Refill: 0 - ALPRAZolam (XANAX) 1 MG tablet; Take 1 tablet (1 mg total) by mouth 3 (three) times daily as needed for anxiety. May fill 60 days after date on prescription.  Dispense: 90 tablet; Refill: 0  5. ADHD (attention deficit hyperactivity disorder) Stable/controlled. - amphetamine-dextroamphetamine (ADDERALL XR) 30 MG 24 hr capsule; 2 tablets qam and 1 q evening. BRAND MEDICALLY NECESSARY.  Dispense: 90 capsule; Refill: 0 - amphetamine-dextroamphetamine (ADDERALL XR) 30 MG 24 hr capsule; 2 tablets qam and 1 q evening. BRAND MEDICALLY NECESSARY. May fill 30 days after date on prescription.  Dispense: 90 capsule; Refill: 0 - amphetamine-dextroamphetamine (ADDERALL XR) 30 MG 24  hr capsule; 2 tablets qam and 1 q evening. BRAND MEDICALLY NECESSARY.May fill 60 days after date on prescription.  Dispense: 90 capsule; Refill: 0  6. Anxiety and depression Stable/controlled. - DULoxetine (CYMBALTA) 60 MG capsule; Take 1 capsule (60 mg total) by mouth 2 (two) times daily.  Dispense: 60 capsule; Refill: 12  7. GERD (gastroesophageal reflux disease) Stable/controlled. - esomeprazole (NEXIUM) 40 MG capsule; Take 1 capsule (40 mg total) by mouth 2 (two) times daily.  Dispense: 60 capsule; Refill: 12  8. AR (allergic rhinitis) Stable/controlled. - fluticasone (FLONASE) 50 MCG/ACT nasal spray; PLACE 2 SPRAYS IN each nostril DAILY  Dispense: 16 g; Refill: 12 - montelukast (SINGULAIR) 10 MG tablet; Take 1 tablet (10 mg total) by mouth 1 day or 1 dose.  Dispense: 30 tablet; Refill: 12  9. Herpes Stable/controlled. - acyclovir ointment (ZOVIRAX) 5 %; Apply 1 application topically every 3 (three) hours. Apply 6x/day x 7  days, prn  Dispense: 30 g; Refill: 4 - valACYclovir (VALTREX) 1000 MG tablet; Take 1 tablet (1,000 mg total) by mouth daily.  Dispense: 90 tablet; Refill: 12  10. Hormone replacement therapy (HRT) Stable/controlled. - estrogen-methylTESTOSTERone (EST ESTROGENS-METHYLTEST HS) 0.625-1.25 MG per tablet; Take 1 tablet daily  Dispense: 30 tablet; Refill: 5  11. Insomnia Stable/controlled. - zolpidem (AMBIEN) 10 MG tablet; Take 1-1.5 tablets (10-15 mg total) by mouth at bedtime as needed.  Dispense: 30 tablet; Refill: 0 - zolpidem (AMBIEN) 10 MG tablet; Take 1-1.5 tablets (10-15 mg total) by mouth at bedtime as needed. May fill 30 days after date on prescription.  Dispense: 30 tablet; Refill: 0 - zolpidem (AMBIEN) 10 MG tablet; Take 1-1.5 tablets (10-15 mg total) by mouth at bedtime as needed. May fill 60 days after date on prescription.  Dispense: 30 tablet; Refill: 0   Fernande Bras, PA-C Physician Assistant-Certified [facility]

## 2013-06-16 NOTE — Patient Instructions (Signed)
Let me know if the Soma isn't helping.  I'll plan to refer you to an orthopedist. Use a warm compress on the area as much as you like, but at least 15 minutes twice a day.

## 2013-06-23 ENCOUNTER — Other Ambulatory Visit: Payer: Self-pay | Admitting: *Deleted

## 2013-06-29 ENCOUNTER — Other Ambulatory Visit: Payer: Self-pay | Admitting: *Deleted

## 2013-06-29 NOTE — Telephone Encounter (Signed)
PA for Adderall has been denied due to The request does not meet the definition of Medical Necessity found in the member's benefit booklet. The dose requested is greater than the max dose recommended in the FDA approved labeling.  Please advise if you would like to start an appeal.

## 2013-06-29 NOTE — Telephone Encounter (Signed)
Please initiate an appeal.

## 2013-06-30 NOTE — Telephone Encounter (Signed)
Chelle, do you want me to write the appeal letter? Is there anything you want me to include other than pt was on lower dose prev that wasn't effective. Hx: pt was on Concerta 36 BIS before 12/28/10 when she was changed to Adderall 30 mg, 2 tabs BID. Then was inc to Adderall XR 30, 2 tabs Qam and 1 tab Qpm. She has been using this dose w/good effectivness and no adverse SEs since then.

## 2013-06-30 NOTE — Telephone Encounter (Signed)
Yes, please write the letter.  Thank you.

## 2013-07-01 NOTE — Telephone Encounter (Signed)
Wrote an Therapist, [employer]appe[employer] letter and faxed it w/cover sheet to [employer]. Letter/[employer] denial letter is in my pending alphabetical folder at desk. Notified pt of status update, which she appreciated.

## 2013-07-02 NOTE — Telephone Encounter (Signed)
Appeal was approved for coverage of 3 tabs QD of Adderall XR 30 mg through 06/24/14, ref # [id number]. Notified pharmacy and pt.   Pt thanked me and also reported that the [employer] Chelle had Rx trial of for back pain HAS helped and pt would like a RF of it if possible. She stated that some days she needed all 4 doses but others, only 2 or 3 doses, and it does not make her sleepy. Please advise.

## 2013-07-03 ENCOUNTER — Telehealth: Payer: Self-pay

## 2013-07-03 ENCOUNTER — Other Ambulatory Visit: Payer: Self-pay | Admitting: Internal Medicine

## 2013-07-03 MED ORDER — CARISOPRODOL 350 MG PO TABS: 350.0000 mg | ORAL_TABLET | Freq: Four times a day (QID) | ORAL | Status: DC

## 2013-07-03 NOTE — Telephone Encounter (Signed)
Faxed, notified pt

## 2013-07-03 NOTE — Telephone Encounter (Signed)
PATIENT SAID SHE RECEIVED A PHONE CALL SAYING THAT HER PERSCRIPTION WAS READY AND WHEN SHE WENT TO THE PHARMACY THEY DIDN'T HAVE IT. PLEASE SEND MEDICATION REFILL ° °

## 2013-07-03 NOTE — Telephone Encounter (Signed)
PATIENT SAID SHE RECEIVED A PHONE CALL SAYING THAT HER PERSCRIPTION WAS READY AND WHEN SHE WENT TO THE PHARMACY THEY DIDN'T HAVE IT. PLEASE SEND MEDICATION REFILL

## 2013-07-05 NOTE — Telephone Encounter (Signed)
Faxed

## 2013-07-05 NOTE — Telephone Encounter (Signed)
Needs review, this printed, but dont know where it went.

## 2013-07-05 NOTE — Telephone Encounter (Signed)
Route to chelle

## 2013-07-06 ENCOUNTER — Telehealth: Payer: Self-pay

## 2013-07-06 DIAGNOSIS — M5030 Other cervical disc degeneration, unspecified cervical region: Secondary | ICD-10-CM

## 2013-07-06 NOTE — Telephone Encounter (Signed)
You only gave her 10 days worth of Soma instead of 30. Does she need to RTC?

## 2013-07-06 NOTE — Telephone Encounter (Signed)
Pt called in and states the RX that Chelle did for Carisoprodol was on 10 and she needed 30. She can be reached at [phone]. Thank you

## 2013-07-06 NOTE — Telephone Encounter (Signed)
Checked w/pharm who reported pt p/up Rx on 07/05/13. Erin had faxed/documented under Refill Enc.

## 2013-07-07 ENCOUNTER — Telehealth: Payer: Self-pay

## 2013-07-07 DIAGNOSIS — M5030 Other cervical disc degeneration, unspecified cervical region: Secondary | ICD-10-CM | POA: Insufficient documentation

## 2013-07-07 MED ORDER — CARISOPRODOL 350 MG PO TABS: ORAL_TABLET | ORAL | Status: DC

## 2013-07-07 NOTE — Telephone Encounter (Signed)
PLEASE CALL! PATIENT IS RETURNING A MISSED PHONE CALL FROM [facility].

## 2013-07-07 NOTE — Telephone Encounter (Signed)
Faxed new Rx and notified pt on VM of Rx and referral.

## 2013-07-07 NOTE — Telephone Encounter (Signed)
I've re-printed the Soma, for 30-days instead of 10.  I've also referred her to ortho, since her symptoms persist.

## 2013-07-07 NOTE — Telephone Encounter (Signed)
See other phone message. Detailed message was left on machine. Unable to reach pt. Left another detailed message and to CB with any questions.

## 2013-07-22 ENCOUNTER — Telehealth: Payer: Self-pay

## 2013-07-22 NOTE — Telephone Encounter (Signed)
Pt CB and she agreed to come in to see Chelle on Friday.

## 2013-07-22 NOTE — Telephone Encounter (Signed)
duplicate

## 2013-07-22 NOTE — Telephone Encounter (Signed)
Pt LM on VM that she wants Chelle to send in Rxs for cipro and metronidazole for a trichomonas infection and UTI. She stated that she is very familiar w/the Sxs and does not want to come in and pay co-pay. Tried to call pt back to explain that we need to eval her before we can Rx antibiotics. She answered and asked if she can call us back, did not discuss this with her yet.

## 2013-07-24 ENCOUNTER — Ambulatory Visit (INDEPENDENT_AMBULATORY_CARE_PROVIDER_SITE_OTHER): Payer: BC Managed Care – PPO | Admitting: Physician Assistant

## 2013-07-24 VITALS — BP 114/72 | HR 73 | Temp 98.5°F | Resp 16 | Ht 62.5 in | Wt 147.0 lb

## 2013-07-24 DIAGNOSIS — Z113 Encounter for screening for infections with a predominantly sexual mode of transmission: Secondary | ICD-10-CM

## 2013-07-24 DIAGNOSIS — N898 Other specified noninflammatory disorders of vagina: Secondary | ICD-10-CM

## 2013-07-24 DIAGNOSIS — R309 Painful micturition, unspecified: Secondary | ICD-10-CM

## 2013-07-24 DIAGNOSIS — R300 Dysuria: Secondary | ICD-10-CM

## 2013-07-24 LAB — POCT URINALYSIS DIPSTICK
Blood, UA: NEGATIVE
GLUCOSE UA: NEGATIVE
KETONES UA: NEGATIVE
Nitrite, UA: NEGATIVE
Protein, UA: NEGATIVE
SPEC GRAV UA: 1.015
Urobilinogen, UA: 2
pH, UA: 7

## 2013-07-24 LAB — POCT UA - MICROSCOPIC ONLY
Bacteria, U Microscopic: NEGATIVE
Casts, Ur, LPF, POC: NEGATIVE
Crystals, Ur, HPF, POC: NEGATIVE
MUCUS UA: NEGATIVE
WBC, UR, HPF, POC: NEGATIVE
Yeast, UA: NEGATIVE

## 2013-07-24 LAB — POCT WET PREP WITH KOH
CLUE CELLS WET PREP PER HPF POC: NEGATIVE
KOH PREP POC: NEGATIVE
RBC Wet Prep HPF POC: NEGATIVE
TRICHOMONAS UA: NEGATIVE
Yeast Wet Prep HPF POC: NEGATIVE

## 2013-07-24 MED ORDER — METRONIDAZOLE 500 MG PO TABS: 500.0000 mg | ORAL_TABLET | Freq: Two times a day (BID) | ORAL | Status: DC

## 2013-07-24 NOTE — Progress Notes (Signed)
Subjective:    Patient ID: Diana Benson, female    DOB: November 19, 1965, 48 y.o.   MRN: [id number]   PCP: Briona Korpela, PA-C  Chief Complaint  Patient presents with  . Urinary Tract Infection    burning and painful urination x 6 days  . Vaginal Discharge    itching    Medications, allergies, past medical history, surgical history, family history, social history and problem list reviewed and updated.  HPI  Reports that she has trichomonas and a UTI, that she needs Cipro and Flagyl, after a sexual encounter with a new partner about 1 week ago.  "Immediately" she developed "thick green vaginal discharge."  Then she developed external vaginal irritation and burning with urination.  She has a history of HSV type 2, and says this irritation is different. The only vaginal outbreak she's every had was the first outbreak.  Since then, all the outbreaks have been in the anal area, and "I have never had an outbreak that I didn't know I was having."  No urinary urgency or frequency.  No nausea or vomiting. No swollen nodes.  Review of Systems As above.    Objective:   Physical Exam  Constitutional: She is oriented to person, place, and time. She appears well-developed and well-nourished. No distress.  BP 114/72  Pulse 73  Temp(Src) 98.5 F (36.9 C) (Oral)  Resp 16  Ht 5' 2.5" (1.588 m)  Wt 147 lb (66.679 kg)  BMI 26.44 kg/m2  SpO2 98%   Neck: Neck supple. No thyromegaly present.  Cardiovascular: Normal rate.   Pulmonary/Chest: Effort normal.  Abdominal: Soft. Bowel sounds are normal. She exhibits no distension and no mass. There is no tenderness. There is no rebound and no guarding.  Genitourinary: Pelvic exam was performed with patient supine. There is lesion (cluster of ulcerations at the introitus, tender on palpation) on the right labia. Vaginal discharge (thin, whitish) found.  Lymphadenopathy:    She has no cervical adenopathy.  Neurological: She is alert and oriented to person,  place, and time.  Skin: Skin is warm and dry.  Psychiatric: She has a normal mood and affect. Her behavior is normal. Thought content normal.      Results for orders placed in visit on 07/24/13  POCT URINALYSIS DIPSTICK      Result Value Ref Range   Color, UA dark amber     Clarity, UA clear     Glucose, UA neg     Bilirubin, UA small     Ketones, UA neg     Spec Grav, UA 1.015     Blood, UA neg     pH, UA 7.0     Protein, UA neg     Urobilinogen, UA 2.0     Nitrite, UA neg     Leukocytes, UA small (1+)    POCT UA - MICROSCOPIC ONLY      Result Value Ref Range   WBC, Ur, HPF, POC neg     RBC, urine, microscopic 0-2     Bacteria, U Microscopic neg     Mucus, UA neg     Epithelial cells, urine per micros 0-3     Crystals, Ur, HPF, POC neg     Casts, Ur, LPF, POC neg     Yeast, UA neg    POCT WET PREP WITH KOH      Result Value Ref Range   Trichomonas, UA Negative     Clue Cells Wet Prep HPF POC neg  Epithelial Wet Prep HPF POC 2-6     Yeast Wet Prep HPF POC neg     Bacteria Wet Prep HPF POC 1+     RBC Wet Prep HPF POC neg     WBC Wet Prep HPF POC 15-20     KOH Prep POC Negative         Assessment & Plan:  1. Painful urination Likely due to the ulcerations on the labia.  She is adamant that these are not HSV lesions. Supportive care. - POCT urinalysis dipstick - POCT UA - Microscopic Only - Urine culture  2. Vaginal discharge Cover for trichomoniasis, though wet prep reveals non-specific vaginosis concerning for chlamydia. - GC/Chlamydia Probe Amp - POCT Wet Prep with KOH - metroNIDAZOLE (FLAGYL) 500 MG tablet; Take 1 tablet (500 mg total) by mouth 2 (two) times daily with a meal. DO NOT CONSUME ALCOHOL WHILE TAKING THIS MEDICATION.  Dispense: 14 tablet; Refill: 0  3. Routine screening for STI (sexually transmitted infection) Await labs. Encouraged consistent condom use. - RPR - HIV antibody   Fernande Brashelle S. Trevaughn Schear, PA-C Physician  Assistant-Certified [facility]

## 2013-07-24 NOTE — Patient Instructions (Signed)
I will contact you with your lab results as soon as they are available.   If you have not heard from me in 2 weeks, please contact me.  The fastest way to get your results is to register for My Chart (see the instructions on the last page of this printout).   

## 2013-07-25 LAB — GC/CHLAMYDIA PROBE AMP
CT Probe RNA: NEGATIVE
GC PROBE AMP APTIMA: NEGATIVE

## 2013-07-25 LAB — RPR

## 2013-07-25 LAB — HIV ANTIBODY (ROUTINE TESTING W REFLEX): HIV 1&2 Ab, 4th Generation: NONREACTIVE

## 2013-07-26 LAB — URINE CULTURE

## 2013-08-04 ENCOUNTER — Other Ambulatory Visit (HOSPITAL_COMMUNITY): Payer: Self-pay | Admitting: Family Medicine

## 2013-08-04 ENCOUNTER — Other Ambulatory Visit (HOSPITAL_COMMUNITY): Payer: Self-pay | Admitting: Physical Medicine and Rehabilitation

## 2013-08-04 DIAGNOSIS — M542 Cervicalgia: Secondary | ICD-10-CM

## 2013-08-04 DIAGNOSIS — M546 Pain in thoracic spine: Secondary | ICD-10-CM

## 2013-08-07 ENCOUNTER — Other Ambulatory Visit (HOSPITAL_COMMUNITY): Payer: Self-pay | Admitting: Sports Medicine

## 2013-08-07 DIAGNOSIS — M546 Pain in thoracic spine: Secondary | ICD-10-CM

## 2013-08-07 DIAGNOSIS — M542 Cervicalgia: Secondary | ICD-10-CM

## 2013-08-13 ENCOUNTER — Ambulatory Visit (HOSPITAL_COMMUNITY)
Admission: RE | Admit: 2013-08-13 | Discharge: 2013-08-13 | Disposition: A | Discharge status: Home / Self Care | Payer: BC Managed Care – PPO | Source: Ambulatory Visit | Attending: Family Medicine | Admitting: Family Medicine

## 2013-08-13 DIAGNOSIS — M546 Pain in thoracic spine: Secondary | ICD-10-CM | POA: Insufficient documentation

## 2013-08-13 DIAGNOSIS — M542 Cervicalgia: Secondary | ICD-10-CM

## 2013-09-16 ENCOUNTER — Other Ambulatory Visit: Payer: Self-pay

## 2013-09-16 DIAGNOSIS — F411 Generalized anxiety disorder: Secondary | ICD-10-CM

## 2013-09-16 MED ORDER — AMPHETAMINE-DEXTROAMPHET ER 30 MG PO CP24: ORAL_CAPSULE | ORAL | Status: DC

## 2013-09-16 MED ORDER — ALPRAZOLAM 1 MG PO TABS: 1.0000 mg | ORAL_TABLET | Freq: Three times a day (TID) | ORAL | Status: DC | PRN

## 2013-09-16 NOTE — Telephone Encounter (Signed)
LMOM that rx is ready for p/u 

## 2013-09-16 NOTE — Telephone Encounter (Signed)
Pt requests RFs on Adderall and alprazolam. She thinks she can probably get 3 more mos worth before Chelle needs to see her again, but if she wants to see her sooner pt would like at least 1 mos of each to give her time to get in. chelle please advise. Pended 3 of each.

## 2013-10-03 ENCOUNTER — Other Ambulatory Visit: Payer: Self-pay | Admitting: Physician Assistant

## 2013-10-06 ENCOUNTER — Other Ambulatory Visit: Payer: Self-pay

## 2013-10-06 DIAGNOSIS — G4700 Insomnia, unspecified: Secondary | ICD-10-CM

## 2013-10-06 NOTE — Telephone Encounter (Signed)
Pt called and stated that she forgot to ask for a RF of ambien when she recently called for RFs of other meds. Chelle do you want to RF ambien as well?

## 2013-10-07 ENCOUNTER — Other Ambulatory Visit: Payer: Self-pay | Admitting: Physician Assistant

## 2013-10-07 MED ORDER — ZOLPIDEM TARTRATE 10 MG PO TABS: 10.0000 mg | ORAL_TABLET | Freq: Every evening | ORAL | Status: DC | PRN

## 2013-10-07 NOTE — Telephone Encounter (Signed)
Faxed

## 2013-10-07 NOTE — Telephone Encounter (Signed)
Notified pt. 

## 2013-10-25 ENCOUNTER — Other Ambulatory Visit: Payer: Self-pay | Admitting: Physician Assistant

## 2013-10-27 NOTE — Telephone Encounter (Signed)
Called in.

## 2013-10-31 ENCOUNTER — Ambulatory Visit (INDEPENDENT_AMBULATORY_CARE_PROVIDER_SITE_OTHER): Payer: BC Managed Care – PPO

## 2013-10-31 ENCOUNTER — Ambulatory Visit (INDEPENDENT_AMBULATORY_CARE_PROVIDER_SITE_OTHER): Payer: BC Managed Care – PPO | Admitting: Physician Assistant

## 2013-10-31 VITALS — BP 120/72 | HR 84 | Temp 98.4°F | Resp 18 | Ht 63.0 in | Wt 151.0 lb

## 2013-10-31 DIAGNOSIS — R05 Cough: Secondary | ICD-10-CM

## 2013-10-31 DIAGNOSIS — R059 Cough, unspecified: Secondary | ICD-10-CM

## 2013-10-31 DIAGNOSIS — R5381 Other malaise: Secondary | ICD-10-CM

## 2013-10-31 DIAGNOSIS — R062 Wheezing: Secondary | ICD-10-CM

## 2013-10-31 DIAGNOSIS — R5081 Fever presenting with conditions classified elsewhere: Secondary | ICD-10-CM

## 2013-10-31 MED ORDER — HYDROCOD POLST-CHLORPHEN POLST 10-8 MG/5ML PO LQCR: 5.0000 mL | Freq: Two times a day (BID) | ORAL | Status: DC | PRN

## 2013-10-31 MED ORDER — PREDNISONE 20 MG PO TABS: ORAL_TABLET | ORAL | Status: DC

## 2013-10-31 MED ORDER — ALBUTEROL SULFATE (2.5 MG/3ML) 0.083% IN NEBU
2.5000 mg | INHALATION_SOLUTION | Freq: Once | RESPIRATORY_TRACT | Status: AC
  Administered 2013-10-31: 2.5 mg via RESPIRATORY_TRACT

## 2013-10-31 MED ORDER — AZITHROMYCIN 500 MG PO TABS: 500.0000 mg | ORAL_TABLET | Freq: Every day | ORAL | Status: DC

## 2013-10-31 MED ORDER — IPRATROPIUM BROMIDE 0.02 % IN SOLN
0.5000 mg | Freq: Once | RESPIRATORY_TRACT | Status: AC
  Administered 2013-10-31: 0.5 mg via RESPIRATORY_TRACT

## 2013-10-31 NOTE — Patient Instructions (Signed)
REST! Drink lots of fluids.

## 2013-10-31 NOTE — Progress Notes (Signed)
Subjective:    Patient ID: Diana Benson, female    DOB: [birth year], 48 y.o.   MRN: [id number]  PCP: Tyrie Porzio,Chayil Gantt, PA-C  Chief Complaint  Patient presents with  . chest congestion    x2.5 weeks  . Cough    dry   . Fever  . Diarrhea  . Flu Vaccine    ?    Medications, allergies, past medical history, surgical history, family history, social history and problem list reviewed and updated.  Patient Active Problem List   Diagnosis Date Noted  . DDD (degenerative disc disease), cervical 07/07/2013  . Coronary artery calcification seen on CAT scan 01/09/2013  . Tobacco use disorder, continuous 01/09/2013  . Cramping of hands 01/09/2013  . Occlusion and stenosis of carotid artery with cerebral infarction 01/09/2013  . PFO (patent foramen ovale) 01/09/2013  . Exertional dyspnea 01/09/2013  . Atypical chest pain 01/09/2013  . CAP (community acquired pneumonia) 11/13/2012  . Asthma 01/19/2011  . Anxiety and depression 01/19/2011  . ADHD (attention deficit hyperactivity disorder) 01/19/2011  . Stroke, history of 01/19/2011    Class: History of  . Fibromyalgia 01/19/2011    Prior to Admission medications   Medication Sig Start Date End Date Taking? Authorizing Provider  acyclovir ointment (ZOVIRAX) 5 % Apply 1 application topically every 3 (three) hours. Apply 6x/day x 7 days, prn 06/16/13  Yes Tessi Eustache S Izaac Reisig, PA-C  albuterol (PROAIR HFA) 108 (90 BASE) MCG/ACT inhaler Inhale 2 puffs into the lungs every 4 (four) hours as needed for wheezing or shortness of breath. 06/16/13  Yes Jaishon Krisher S Wendolyn Raso, PA-C  albuterol (PROVENTIL) (5 MG/ML) 0.5% nebulizer solution Take 0.5 mLs (2.5 mg total) by nebulization every 6 (six) hours as needed. 09/18/12  Yes Danai Gotto S Lounell Schumacher, PA-C  ALPRAZolam (XANAX) 1 MG tablet Take 1 tablet (1 mg total) by mouth 3 (three) times daily as needed for anxiety. May fill 60 days after date on prescription. 09/16/13  Yes Monserat Prestigiacomo S Kamorie Aldous, PA-C    amphetamine-dextroamphetamine (ADDERALL XR) 30 MG 24 hr capsule 2 tablets qam and 1 q evening. BRAND MEDICALLY NECESSARY.May fill 60 days after date on prescription. 09/16/13  Yes Duston Smolenski S Sanvi Ehler, PA-C  CALCIUM PO Take 2 tablets by mouth daily.   Yes Historical Provider, MD  carisoprodol (SOMA) 350 MG tablet TAKE 1 TABLET BY MOUTH 4 TIMES DAILY 07/07/13  Yes Ysela Hettinger S Darlette Dubow, PA-C  DULoxetine (CYMBALTA) 60 MG capsule TAKE 1 CAPSULE (60 MG TOTAL) BY MOUTH 2 (TWO) TIMES DAILY. 10/08/13  Yes Tarell Schollmeyer S Mima Cranmore, PA-C  esomeprazole (NEXIUM) 40 MG capsule Take 1 capsule (40 mg total) by mouth 2 (two) times daily. 06/16/13  Yes Lenorris Karger Tessa LernerS Arayna Illescas, PA-C  estrogen-methylTESTOSTERone (EST ESTROGENS-METHYLTEST HS) 0.625-1.25 MG per tablet Take 1 tablet daily 06/16/13  Yes Kaelah Hayashi S Perpetua Elling, PA-C  fluticasone (FLONASE) 50 MCG/ACT nasal spray PLACE 2 SPRAYS IN each nostril DAILY 06/16/13  Yes Talulah Schirmer S Jannetta Massey, PA-C  Fluticasone-Salmeterol (ADVAIR) 500-50 MCG/DOSE AEPB Inhale 1 puff into the lungs 2 (two) times daily. 06/16/13  Yes Paeton Latouche S Tiran Sauseda, PA-C  montelukast (SINGULAIR) 10 MG tablet Take 1 tablet (10 mg total) by mouth 1 day or 1 dose. 06/16/13  Yes Novalee Horsfall S Giovan Pinsky, PA-C  valACYclovir (VALTREX) 1000 MG tablet Take 1 tablet (1,000 mg total) by mouth daily. 06/16/13  Yes Kathleene Bergemann S Betina Puckett, PA-C  zolpidem (AMBIEN) 10 MG tablet Take 1-1.5 tablets (10-15 mg total) by mouth at bedtime as needed. May fill 30 days after date on prescription. 06/16/13  Yes Raiford Fetterman S Lenorris Karger, PA-C  ALPRAZolam (XANAX) 1 MG tablet Take 1 tablet (1 mg total) by mouth 3 (three) times daily as needed for anxiety. May fill 30 days after date on prescription. 09/16/13   Amari Zagal S Meganne Rita, PA-C  ALPRAZolam (XANAX) 1 MG tablet Take 1 tablet (1 mg total) by mouth 3 (three) times daily as needed for anxiety. 09/16/13   Oakleigh Hesketh S Birdie Beveridge, PA-C  amphetamine-dextroamphetamine (ADDERALL XR) 30 MG 24 hr capsule 2 tablets qam and 1 q evening. BRAND MEDICALLY NECESSARY.  May fill 30 days after date on prescription. 09/16/13   Kuuipo Anzaldo S Zayanna Pundt, PA-C  amphetamine-dextroamphetamine (ADDERALL XR) 30 MG 24 hr capsule 2 tablets qam and 1 q evening. BRAND MEDICALLY NECESSARY. 09/16/13   Karagan Lehr S Melvern Ramone, PA-C  zolpidem (AMBIEN) 10 MG tablet Take 1-1.5 tablets (10-15 mg total) by mouth at bedtime as needed. May fill 60 days after date on prescription. 06/16/13   Deryck Hippler S Calvary Difranco, PA-C  zolpidem (AMBIEN) 10 MG tablet Take 1-1.5 tablets (10-15 mg total) by mouth at bedtime as needed. 10/07/13   Cleatis Fandrich Tessa LernerS Ethell Blatchford, PA-C  zolpidem (AMBIEN) 10 MG tablet TAKE 1-1&1/2 TABLET AT BEDTIME AS NEEDED 10/26/13   Fernande Brashelle S Standley Bargo, PA-C    HPI  This 48 y.o. female presents for evaluation of cough, chest congestion, fever (Tmax 103.2) and generalized malaise. 2.5 weeks. OTC Mucinex without benefit. Cough is non-productive, but hurts. 10/29/2013 had multiple episodes of diarrhea, none since. No appetite today. Able to keep fluids down. No nausea/vomiting. "My chest hurts, to the touch. Feels so bruised, like it ought to be black and blue."  Review of Systems     Objective:   Physical Exam  Vitals reviewed. Constitutional: She is oriented to person, place, and time. Vital signs are normal. She appears well-developed and well-nourished. She is active and cooperative. No distress.  BP 120/72  Pulse 84  Temp(Src) 98.4 F (36.9 C) (Oral)  Resp 18  Ht 5\' 3"  (1.6 m)  Wt 151 lb (68.493 kg)  BMI 26.76 kg/m2  SpO2 98%  HENT:  Head: Normocephalic and atraumatic.  Right Ear: Hearing normal.  Left Ear: Hearing normal.  Eyes: Conjunctivae are normal. No scleral icterus.  Neck: Normal range of motion. Neck supple. No thyromegaly present.  Cardiovascular: Normal rate, regular rhythm and normal heart sounds.   Pulses:      Radial pulses are 2+ on the right side, and 2+ on the left side.  Pulmonary/Chest: Effort normal. She has no decreased breath sounds. She has wheezes. She has rhonchi.  She has no rales. She exhibits tenderness (diffusely tender throughout).  Lymphadenopathy:       Head (right side): No tonsillar, no preauricular, no posterior auricular and no occipital adenopathy present.       Head (left side): No tonsillar, no preauricular, no posterior auricular and no occipital adenopathy present.    She has no cervical adenopathy.       Right: No supraclavicular adenopathy present.       Left: No supraclavicular adenopathy present.  Neurological: She is alert and oriented to person, place, and time. No sensory deficit.  Skin: Skin is warm, dry and intact. No rash noted. No cyanosis or erythema. Nails show no clubbing.  Psychiatric: She has a normal mood and affect.      CXR: [facility] reading (PRIMARY) by  Dr. Cleta Albertsaub. Likely early infiltrate in the RIGHT lower mid lung, where there is a patch of increased markings.  Compared to CXR  11/2012, this is new and the infiltrate then is resolved.  CT chest in the interim is available for comparison.  Albuterol + Atrovent neb improved her lung sounds, but did not change her subjective symptoms.     Assessment & Plan:  1. Cough Likely CAP. Treat with Azithromycin. Await radiology overread. Never had follow-up CT scan last winter (there are 3 orders for it in her chart!), and she may need that. Supportive care. Rest. If she's not able to RTW on 11/02/2013, will likely RTC for re-evaluation. - DG Chest 2 View; Future - albuterol (PROVENTIL) (2.5 MG/3ML) 0.083% nebulizer solution 2.5 mg; Take 3 mLs (2.5 mg total) by nebulization once. - ipratropium (ATROVENT) nebulizer solution 0.5 mg; Take 2.5 mLs (0.5 mg total) by nebulization once. - azithromycin (ZITHROMAX) 500 MG tablet; Take 1 tablet (500 mg total) by mouth daily.  Dispense: 3 tablet; Refill: 0 - chlorpheniramine-HYDROcodone (TUSSIONEX PENNKINETIC ER) 10-8 MG/5ML LQCR; Take 5 mLs by mouth every 12 (twelve) hours as needed for cough (cough).  Dispense: 100 mL; Refill: 0 -  predniSONE (DELTASONE) 20 MG tablet; Take 3 PO QAM x3days, 2 PO QAM x3days, 1 PO QAM x3days  Dispense: 18 tablet; Refill: 0    Fernande Bras, PA-C Physician Assistant-Certified [facility]

## 2013-11-24 ENCOUNTER — Emergency Department (HOSPITAL_COMMUNITY): Payer: BC Managed Care – PPO

## 2013-11-24 ENCOUNTER — Ambulatory Visit (INDEPENDENT_AMBULATORY_CARE_PROVIDER_SITE_OTHER): Payer: BC Managed Care – PPO | Admitting: Family Medicine

## 2013-11-24 ENCOUNTER — Observation Stay (HOSPITAL_COMMUNITY)
Admission: EM | Admit: 2013-11-24 | Discharge: 2013-11-26 | Disposition: A | Discharge status: Home / Self Care | Payer: BC Managed Care – PPO | Attending: Internal Medicine | Admitting: Internal Medicine

## 2013-11-24 ENCOUNTER — Encounter (HOSPITAL_COMMUNITY): Payer: Self-pay | Admitting: Radiology

## 2013-11-24 VITALS — BP 96/60 | HR 95

## 2013-11-24 DIAGNOSIS — F909 Attention-deficit hyperactivity disorder, unspecified type: Secondary | ICD-10-CM | POA: Insufficient documentation

## 2013-11-24 DIAGNOSIS — J449 Chronic obstructive pulmonary disease, unspecified: Secondary | ICD-10-CM | POA: Insufficient documentation

## 2013-11-24 DIAGNOSIS — R519 Headache, unspecified: Secondary | ICD-10-CM

## 2013-11-24 DIAGNOSIS — Z7951 Long term (current) use of inhaled steroids: Secondary | ICD-10-CM | POA: Diagnosis not present

## 2013-11-24 DIAGNOSIS — R531 Weakness: Secondary | ICD-10-CM | POA: Diagnosis not present

## 2013-11-24 DIAGNOSIS — R05 Cough: Secondary | ICD-10-CM | POA: Diagnosis not present

## 2013-11-24 DIAGNOSIS — R2990 Unspecified symptoms and signs involving the nervous system: Secondary | ICD-10-CM

## 2013-11-24 DIAGNOSIS — Q211 Atrial septal defect: Secondary | ICD-10-CM | POA: Diagnosis not present

## 2013-11-24 DIAGNOSIS — F419 Anxiety disorder, unspecified: Secondary | ICD-10-CM | POA: Diagnosis not present

## 2013-11-24 DIAGNOSIS — R079 Chest pain, unspecified: Secondary | ICD-10-CM

## 2013-11-24 DIAGNOSIS — Z79899 Other long term (current) drug therapy: Secondary | ICD-10-CM | POA: Diagnosis not present

## 2013-11-24 DIAGNOSIS — F17209 Nicotine dependence, unspecified, with unspecified nicotine-induced disorders: Secondary | ICD-10-CM | POA: Diagnosis present

## 2013-11-24 DIAGNOSIS — I69392 Facial weakness following cerebral infarction: Secondary | ICD-10-CM | POA: Diagnosis not present

## 2013-11-24 DIAGNOSIS — R202 Paresthesia of skin: Secondary | ICD-10-CM

## 2013-11-24 DIAGNOSIS — G451 Carotid artery syndrome (hemispheric): Secondary | ICD-10-CM

## 2013-11-24 DIAGNOSIS — Z87891 Personal history of nicotine dependence: Secondary | ICD-10-CM | POA: Diagnosis not present

## 2013-11-24 DIAGNOSIS — F32A Depression, unspecified: Secondary | ICD-10-CM | POA: Diagnosis present

## 2013-11-24 DIAGNOSIS — R29810 Facial weakness: Secondary | ICD-10-CM

## 2013-11-24 DIAGNOSIS — M797 Fibromyalgia: Secondary | ICD-10-CM | POA: Diagnosis present

## 2013-11-24 DIAGNOSIS — F329 Major depressive disorder, single episode, unspecified: Secondary | ICD-10-CM | POA: Diagnosis not present

## 2013-11-24 DIAGNOSIS — K219 Gastro-esophageal reflux disease without esophagitis: Secondary | ICD-10-CM | POA: Insufficient documentation

## 2013-11-24 DIAGNOSIS — Z8673 Personal history of transient ischemic attack (TIA), and cerebral infarction without residual deficits: Secondary | ICD-10-CM

## 2013-11-24 DIAGNOSIS — R200 Anesthesia of skin: Secondary | ICD-10-CM | POA: Diagnosis not present

## 2013-11-24 DIAGNOSIS — I639 Cerebral infarction, unspecified: Secondary | ICD-10-CM | POA: Diagnosis present

## 2013-11-24 DIAGNOSIS — I959 Hypotension, unspecified: Secondary | ICD-10-CM

## 2013-11-24 DIAGNOSIS — J45909 Unspecified asthma, uncomplicated: Secondary | ICD-10-CM | POA: Diagnosis present

## 2013-11-24 DIAGNOSIS — R51 Headache: Secondary | ICD-10-CM

## 2013-11-24 DIAGNOSIS — R059 Cough, unspecified: Secondary | ICD-10-CM

## 2013-11-24 DIAGNOSIS — G459 Transient cerebral ischemic attack, unspecified: Secondary | ICD-10-CM | POA: Diagnosis present

## 2013-11-24 DIAGNOSIS — I2510 Atherosclerotic heart disease of native coronary artery without angina pectoris: Secondary | ICD-10-CM | POA: Diagnosis not present

## 2013-11-24 DIAGNOSIS — Z8669 Personal history of other diseases of the nervous system and sense organs: Secondary | ICD-10-CM

## 2013-11-24 LAB — CBC
HCT: 35.5 % — ABNORMAL LOW (ref 36.0–46.0)
Hemoglobin: 11.3 g/dL — ABNORMAL LOW (ref 12.0–15.0)
MCH: 28.8 pg (ref 26.0–34.0)
MCHC: 31.8 g/dL (ref 30.0–36.0)
MCV: 90.3 fL (ref 78.0–100.0)
PLATELETS: 243 10*3/uL (ref 150–400)
RBC: 3.93 MIL/uL (ref 3.87–5.11)
RDW: 15.7 % — AB (ref 11.5–15.5)
WBC: 5.5 10*3/uL (ref 4.0–10.5)

## 2013-11-24 LAB — COMPREHENSIVE METABOLIC PANEL
ALBUMIN: 2.8 g/dL — AB (ref 3.5–5.2)
ALT: 19 U/L (ref 0–35)
ANION GAP: 10 (ref 5–15)
AST: 17 U/L (ref 0–37)
Alkaline Phosphatase: 58 U/L (ref 39–117)
BUN: 7 mg/dL (ref 6–23)
CALCIUM: 7.8 mg/dL — AB (ref 8.4–10.5)
CO2: 24 mEq/L (ref 19–32)
CREATININE: 0.52 mg/dL (ref 0.50–1.10)
Chloride: 105 mEq/L (ref 96–112)
GFR calc Af Amer: >90 mL/min (ref >90)
Glucose, Bld: 210 mg/dL — ABNORMAL HIGH (ref 70–99)
Potassium: 3.9 mEq/L (ref 3.7–5.3)
Sodium: 139 mEq/L (ref 137–147)
Total Bilirubin: <0.2 mg/dL — ABNORMAL LOW (ref 0.3–1.2)
Total Protein: 6 g/dL (ref 6.0–8.3)

## 2013-11-24 LAB — I-STAT TROPONIN, ED: Troponin i, poc: 0 ng/mL (ref 0.00–0.08)

## 2013-11-24 LAB — GLUCOSE, POCT (MANUAL RESULT ENTRY): POC Glucose: 88 mg/dl (ref 70–99)

## 2013-11-24 LAB — CBG MONITORING, ED: Glucose-Capillary: 153 mg/dL — ABNORMAL HIGH (ref 70–99)

## 2013-11-24 MED ORDER — ZOLPIDEM TARTRATE 5 MG PO TABS: 10.0000 mg | ORAL_TABLET | Freq: Every evening | ORAL | Status: DC | PRN

## 2013-11-24 MED ORDER — FLUTICASONE PROPIONATE 50 MCG/ACT NA SUSP
2.0000 | Freq: Every day | NASAL | Status: DC
  Administered 2013-11-25 – 2013-11-26 (×2): 2 via NASAL
  Filled 2013-11-24: qty 16

## 2013-11-24 MED ORDER — MOMETASONE FURO-FORMOTEROL FUM 200-5 MCG/ACT IN AERO
2.0000 | INHALATION_SPRAY | Freq: Two times a day (BID) | RESPIRATORY_TRACT | Status: DC
Start: 2013-11-24 — End: 2013-11-26
  Administered 2013-11-25 – 2013-11-26 (×3): 2 via RESPIRATORY_TRACT
  Filled 2013-11-24 (×2): qty 8.8

## 2013-11-24 MED ORDER — ALBUTEROL SULFATE HFA 108 (90 BASE) MCG/ACT IN AERS: 2.0000 | INHALATION_SPRAY | RESPIRATORY_TRACT | Status: DC | PRN

## 2013-11-24 MED ORDER — DEXTROSE 50 % IV SOLN
1.0000 | Freq: Once | INTRAVENOUS | Status: AC
  Administered 2013-11-24: 50 mL via INTRAVENOUS

## 2013-11-24 MED ORDER — ACETAMINOPHEN 650 MG RE SUPP: 650.0000 mg | RECTAL | Status: DC | PRN

## 2013-11-24 MED ORDER — MONTELUKAST SODIUM 10 MG PO TABS
10.0000 mg | ORAL_TABLET | Freq: Every day | ORAL | Status: DC
  Administered 2013-11-25 (×2): 10 mg via ORAL
  Filled 2013-11-24 (×2): qty 1

## 2013-11-24 MED ORDER — DULOXETINE HCL 60 MG PO CPEP
60.0000 mg | ORAL_CAPSULE | Freq: Two times a day (BID) | ORAL | Status: DC
  Administered 2013-11-25 – 2013-11-26 (×4): 60 mg via ORAL
  Filled 2013-11-24 (×4): qty 1

## 2013-11-24 MED ORDER — ACETAMINOPHEN 325 MG PO TABS: 650.0000 mg | ORAL_TABLET | ORAL | Status: DC | PRN

## 2013-11-24 MED ORDER — VALACYCLOVIR HCL 500 MG PO TABS
1000.0000 mg | ORAL_TABLET | Freq: Every day | ORAL | Status: DC
  Administered 2013-11-25 – 2013-11-26 (×2): 1000 mg via ORAL
  Filled 2013-11-24 (×2): qty 2

## 2013-11-24 MED ORDER — FENTANYL CITRATE 0.05 MG/ML IJ SOLN
50.0000 ug | Freq: Once | INTRAMUSCULAR | Status: AC
  Administered 2013-11-24: 50 ug via INTRAVENOUS
  Filled 2013-11-24: qty 2

## 2013-11-24 MED ORDER — ALPRAZOLAM 0.5 MG PO TABS
1.0000 mg | ORAL_TABLET | Freq: Three times a day (TID) | ORAL | Status: DC | PRN
  Administered 2013-11-25 (×2): 1 mg via ORAL
  Filled 2013-11-24 (×2): qty 2

## 2013-11-24 MED ORDER — ALBUTEROL SULFATE (2.5 MG/3ML) 0.083% IN NEBU
2.5000 mg | INHALATION_SOLUTION | Freq: Four times a day (QID) | RESPIRATORY_TRACT | Status: DC | PRN
  Administered 2013-11-25: 2.5 mg via RESPIRATORY_TRACT
  Filled 2013-11-24 (×2): qty 3

## 2013-11-24 MED ORDER — PANTOPRAZOLE SODIUM 40 MG PO TBEC
80.0000 mg | DELAYED_RELEASE_TABLET | Freq: Every day | ORAL | Status: DC
  Administered 2013-11-25 – 2013-11-26 (×2): 80 mg via ORAL
  Filled 2013-11-24 (×2): qty 2

## 2013-11-24 MED ORDER — EST ESTROGENS-METHYLTEST 0.625-1.25 MG PO TABS
1.0000 | ORAL_TABLET | Freq: Every day | ORAL | Status: DC
  Administered 2013-11-25: 1 via ORAL
  Filled 2013-11-24: qty 1

## 2013-11-24 MED ORDER — HEPARIN SODIUM (PORCINE) 5000 UNIT/ML IJ SOLN
5000.0000 [IU] | Freq: Three times a day (TID) | INTRAMUSCULAR | Status: DC
  Administered 2013-11-25 – 2013-11-26 (×5): 5000 [IU] via SUBCUTANEOUS
  Filled 2013-11-24 (×5): qty 1

## 2013-11-24 MED ORDER — IBUPROFEN 600 MG PO TABS
600.0000 mg | ORAL_TABLET | Freq: Four times a day (QID) | ORAL | Status: DC | PRN
  Administered 2013-11-25: 600 mg via ORAL
  Filled 2013-11-24: qty 1

## 2013-11-24 MED ORDER — ASPIRIN EC 325 MG PO TBEC
325.0000 mg | DELAYED_RELEASE_TABLET | Freq: Every day | ORAL | Status: DC
  Administered 2013-11-25 – 2013-11-26 (×2): 325 mg via ORAL
  Filled 2013-11-24 (×2): qty 1

## 2013-11-24 MED ORDER — STROKE: EARLY STAGES OF RECOVERY BOOK
Freq: Once | Status: AC
  Administered 2013-11-25: 05:00:00
  Filled 2013-11-24: qty 1

## 2013-11-24 NOTE — Consult Note (Signed)
Referring Physician: Dr. Gwendolyn GrantWalden    Chief Complaint: New onset slurred speech.  HPI: Diana Benson is an 48 y.o. female history of stroke in 2008, COPD, depression and anxiety, PFO, fibromyalgia and attention deficit disorder, presenting with new onset right facial droop and slurred speech which started at 7:25 PM this evening. Patient has not been on antiplatelet therapy. She was hospitalized in [place] in 2008 when she reportedly had a stroke. She is status post open angioplasty involving left carotid artery. CT scan of her head was normal with no signs of acute hemorrhage and no indication of old stroke. NIH stroke score was 3 with subjective sensory changes and drift of right upper and lower extremities. Facial droop was intermittent and functional appearance when present.  LSN: 7:25 PM on 11/24/2013 tPA Given: No: Minimal deficits with suspected functional overlay mRankin:  Past Medical History  Diagnosis Date  . COPD (chronic obstructive pulmonary disease) with chronic bronchitis   . CVA (cerebral vascular accident) December 2008    Status post left-sided CAE  . GERD (gastroesophageal reflux disease)   . Asthma   . ADHD (attention deficit hyperactivity disorder)   . Anxiety   . Depression   . Fibromyalgia   . Tobacco use disorder, continuous 01/09/2013  . PFO (patent foramen ovale) 01/09/2013  . Coronary artery calcification seen on CAT scan 01/09/2013    Family history: Positive for stroke as well as hypertension; negative for diabetes mellitus  Medications: I have reviewed the patient's current medications.  ROS: History obtained from mother and the patient  General ROS: negative for - chills, fatigue, fever, night sweats, weight gain or weight loss Psychological ROS: negative for - behavioral disorder, hallucinations, memory difficulties, mood swings or suicidal ideation Ophthalmic ROS: negative for - blurry vision, double vision, eye pain or loss of vision ENT ROS: negative for -  epistaxis, nasal discharge, oral lesions, sore throat, tinnitus or vertigo Allergy and Immunology ROS: negative for - hives or itchy/watery eyes Hematological and Lymphatic ROS: negative for - bleeding problems, bruising or swollen lymph nodes Endocrine ROS: negative for - galactorrhea, hair pattern changes, polydipsia/polyuria or temperature intolerance Respiratory ROS: Positive for persistent cough along with chest pain Cardiovascular ROS: negative for - chest pain, dyspnea on exertion, edema or irregular heartbeat Gastrointestinal ROS: negative for - abdominal pain, diarrhea, hematemesis, nausea/vomiting or stool incontinence Genito-Urinary ROS: negative for - dysuria, hematuria, incontinence or urinary frequency/urgency Musculoskeletal ROS: negative for - joint swelling or muscular weakness Neurological ROS: as noted in HPI Dermatological ROS: negative for rash and skin lesion changes  Physical Examination: Blood pressure 122/76, pulse 82, temperature 98.2 F (36.8 C), temperature source Oral, resp. rate 16, height 5\' 3"  (1.6 m), weight 68.493 kg (151 lb), SpO2 100 %.  Appearance was that of middle-aged lady of medium build. He was complaining of headache and chest pain.  Neurologic Examination: Mental Status: Alert, oriented, thought content appropriate.  Speech fluent without evidence of aphasia. Able to follow commands without difficulty. Cranial Nerves: II-Visual fields were normal. III/IV/VI-Pupils were equal and reacted. Extraocular movements were full and conjugate.    V/VII-no facial numbness and no facial weakness. VIII-normal. X-normal speech and symmetrical palatal movement. Motor: Slight drift of right upper and lower extremities; motor exam is otherwise unremarkable. Sensory: Reduced perception of tactile sensation over right extremities compared to left extremities. Deep Tendon Reflexes: 1+ and symmetric. Plantars: Flexor bilaterally Cerebellar: Normal finger-to-nose  testing. Carotid auscultation: Normal  Dg Chest 2 View  11/24/2013  CLINICAL DATA:  RIGHT-sided facial droop beginning at 1845 hr today. History of RIGHT-sided Bell's palsy and strokes.  EXAM: CHEST  2 VIEW  COMPARISON:  Chest radiograph October 31, 2013  FINDINGS: The heart size and mediastinal contours are within normal limits. Mild bronchitic changes. Both lungs are clear. The visualized skeletal structures are unremarkable.  IMPRESSION: Mild chronic bronchitic changes without focal consolidation.   Electronically Signed   By: Awilda Metroourtnay  Bloomer   On: 11/24/2013 21:40   Ct Head (brain) Wo Contrast  11/24/2013   CLINICAL DATA:  Code stroke, right facial weakness, right facial droop  EXAM: CT HEAD WITHOUT CONTRAST  TECHNIQUE: Contiguous axial images were obtained from the base of the skull through the vertex without intravenous contrast.  COMPARISON:  MRI brain 08/13/2013, CT brain 05/23/2012  FINDINGS: There is no evidence of mass effect, midline shift or extra-axial fluid collections. There is no evidence of a space-occupying lesion or intracranial hemorrhage. There is no evidence of a cortical-based area of acute infarction.  The ventricles and sulci are appropriate for the patient's age. The basal cisterns are patent.  Visualized portions of the orbits are unremarkable. The visualized portions of the paranasal sinuses and mastoid air cells are unremarkable.  The osseous structures are unremarkable.  IMPRESSION: Normal CT of the brain without intravenous contrast.  These results were called by telephone at the time of interpretation on 11/24/2013 at 8:06 pm to Dr. Roseanne RenoSTEWART, who verbally acknowledged these results.   Electronically Signed   By: Elige KoHetal  Patel   On: 11/24/2013 20:07    Assessment: 48 y.o. female with an equivocal history of stroke, presenting with slurred speech and facial droop, along with equivocal weakness of right extremities and sensory changes on the right. There appears to be  significant functional overlay. However, TIA possible small left subcortical stroke cannot be completely ruled out at this point.  Stroke Risk Factors - family history and smoking  Plan: 1. HgbA1c, fasting lipid panel 2. MRI, MRA  of the brain without contrast 3. PT consult, OT consult, Speech consult 4. Echocardiogram 5. Carotid dopplers 6. Prophylactic therapy-Antiplatelet med: Aspirin  7. Risk factor modification 8. Laboratory studies to rule out hypercoagulable state   C.R. Roseanne RenoStewart, MD [facility] [phone]  11/24/2013, 9:51 PM

## 2013-11-24 NOTE — ED Notes (Signed)
Attempted report, bed not assigned yet per charge nurse on 3W.

## 2013-11-24 NOTE — ED Provider Notes (Signed)
CSN: [id number]     Arrival date & time 11/24/13  1943 History   First MD Initiated Contact with Patient 11/24/13 1947     No chief complaint on file.    (Consider location/radiation/quality/duration/timing/severity/associated sxs/prior Treatment) HPI Comments: Recent treatment for pneumonia. Hasn't been feeling well over the past few days. Worsening malaise for past few days. R sided deficits began on the way to the Urgent Care.  Patient is a 48 y.o. female presenting with neurologic complaint. The history is provided by the patient.  Neurologic Problem This is a new problem. The current episode started 1 to 2 hours ago. The problem occurs constantly. The problem has not changed since onset.Pertinent negatives include no abdominal pain and no shortness of breath. Nothing aggravates the symptoms. Nothing relieves the symptoms. She has tried nothing for the symptoms. The treatment provided no relief.    Past Medical History  Diagnosis Date  . COPD (chronic obstructive pulmonary disease) with chronic bronchitis   . CVA (cerebral vascular accident) December 2008    Status post left-sided CAE  . GERD (gastroesophageal reflux disease)   . Asthma   . ADHD (attention deficit hyperactivity disorder)   . Anxiety   . Depression   . Fibromyalgia   . Tobacco use disorder, continuous 01/09/2013  . PFO (patent foramen ovale) 01/09/2013  . Coronary artery calcification seen on CAT scan 01/09/2013   Past Surgical History  Procedure Laterality Date  . Appendectomy    . Abdominal hysterectomy    . Gastric bypass  05/21/2001    [place]oux[place]-Y[place]  . Tonsillectomy    . Cosmetic surgery      Post GOP  . Carotid endarterectomy Left December 2008    [facility]   Family History  Problem Relation Age of Onset  . Osteoporosis Mother   . Hypertension Mother   . Stroke Father   . Drug abuse Daughter   . Alzheimer's disease Paternal Grandmother   . Transient ischemic attack Paternal  Grandmother   . Acute myelogenous leukemia Paternal Grandfather   . Heart Problems Brother     ?murmur   History  Substance Use Topics  . Smoking status: Former Smoker -- 1.00 packs/day for 17 years    Types: Cigarettes    Quit date: 06/09/2010  . Smokeless tobacco: Never Used     Comment: 1 pack of cigarettes/week (01/09/2013)  . Alcohol Use: Yes     Comment: rare   OB History    No data available     Review of Systems  Constitutional: Negative for fever.  Respiratory: Negative for cough and shortness of breath.   Gastrointestinal: Negative for vomiting and abdominal pain.  All other systems reviewed and are negative.     Allergies  Codeine; Flexeril; Gabapentin; Morphine and related; and Venlafaxine  Home Medications   Prior to Admission medications   Medication Sig Start Date End Date Taking? Authorizing Provider  acyclovir ointment (ZOVIRAX) 5 % Apply 1 application topically every 3 (three) hours. Apply 6x/day x 7 days, prn 06/16/13   Chelle S Jeffery, PA-C  albuterol (PROAIR HFA) 108 (90 BASE) MCG/ACT inhaler Inhale 2 puffs into the lungs every 4 (four) hours as needed for wheezing or shortness of breath. 06/16/13   Chelle S Jeffery, PA-C  albuterol (PROVENTIL) (5 MG/ML) 0.5% nebulizer solution Take 0.5 mLs (2.5 mg total) by nebulization every 6 (six) hours as needed. 09/18/12   Chelle Tessa LernerS Jeffery, PA-C  ALPRAZolam Prudy Feeler(XANAX) 1 MG tablet Take  1 tablet (1 mg total) by mouth 3 (three) times daily as needed for anxiety. May fill 60 days after date on prescription. 09/16/13   Chelle S Jeffery, PA-C  ALPRAZolam (XANAX) 1 MG tablet Take 1 tablet (1 mg total) by mouth 3 (three) times daily as needed for anxiety. May fill 30 days after date on prescription. 09/16/13   Chelle S Jeffery, PA-C  ALPRAZolam (XANAX) 1 MG tablet Take 1 tablet (1 mg total) by mouth 3 (three) times daily as needed for anxiety. 09/16/13   Chelle S Jeffery, PA-C  amphetamine-dextroamphetamine (ADDERALL XR) 30 MG 24 hr  capsule 2 tablets qam and 1 q evening. BRAND MEDICALLY NECESSARY.May fill 60 days after date on prescription. 09/16/13   Chelle S Jeffery, PA-C  amphetamine-dextroamphetamine (ADDERALL XR) 30 MG 24 hr capsule 2 tablets qam and 1 q evening. BRAND MEDICALLY NECESSARY. May fill 30 days after date on prescription. 09/16/13   Chelle S Jeffery, PA-C  amphetamine-dextroamphetamine (ADDERALL XR) 30 MG 24 hr capsule 2 tablets qam and 1 q evening. BRAND MEDICALLY NECESSARY. 09/16/13   Chelle S Jeffery, PA-C  azithromycin (ZITHROMAX) 500 MG tablet Take 1 tablet (500 mg total) by mouth daily. 10/31/13   Chelle Tessa Lerner, PA-C  CALCIUM PO Take 2 tablets by mouth daily.    Historical Provider, MD  carisoprodol (SOMA) 350 MG tablet TAKE 1 TABLET BY MOUTH 4 TIMES DAILY 07/07/13   Chelle Tessa Lerner, PA-C  chlorpheniramine-HYDROcodone (TUSSIONEX PENNKINETIC ER) 10-8 MG/5ML LQCR Take 5 mLs by mouth every 12 (twelve) hours as needed for cough (cough). 10/31/13   Chelle S Jeffery, PA-C  DULoxetine (CYMBALTA) 60 MG capsule TAKE 1 CAPSULE (60 MG TOTAL) BY MOUTH 2 (TWO) TIMES DAILY. 10/08/13   Chelle S Jeffery, PA-C  esomeprazole (NEXIUM) 40 MG capsule Take 1 capsule (40 mg total) by mouth 2 (two) times daily. 06/16/13   Chelle Tessa Lerner, PA-C  estrogen-methylTESTOSTERone (EST ESTROGENS-METHYLTEST HS) 0.625-1.25 MG per tablet Take 1 tablet daily 06/16/13   Chelle S Jeffery, PA-C  fluticasone (FLONASE) 50 MCG/ACT nasal spray PLACE 2 SPRAYS IN each nostril DAILY 06/16/13   Chelle S Jeffery, PA-C  Fluticasone-Salmeterol (ADVAIR) 500-50 MCG/DOSE AEPB Inhale 1 puff into the lungs 2 (two) times daily. 06/16/13   Chelle S Jeffery, PA-C  montelukast (SINGULAIR) 10 MG tablet Take 1 tablet (10 mg total) by mouth 1 day or 1 dose. 06/16/13   Chelle Tessa Lerner, PA-C  predniSONE (DELTASONE) 20 MG tablet Take 3 PO QAM x3days, 2 PO QAM x3days, 1 PO QAM x3days 10/31/13   Chelle S Jeffery, PA-C  valACYclovir (VALTREX) 1000 MG tablet Take 1 tablet (1,000 mg  total) by mouth daily. 06/16/13   Chelle S Jeffery, PA-C  zolpidem (AMBIEN) 10 MG tablet Take 1-1.5 tablets (10-15 mg total) by mouth at bedtime as needed. May fill 30 days after date on prescription. 06/16/13   Chelle S Jeffery, PA-C  zolpidem (AMBIEN) 10 MG tablet Take 1-1.5 tablets (10-15 mg total) by mouth at bedtime as needed. May fill 60 days after date on prescription. 06/16/13   Chelle S Jeffery, PA-C  zolpidem (AMBIEN) 10 MG tablet Take 1-1.5 tablets (10-15 mg total) by mouth at bedtime as needed. 10/07/13   Chelle Tessa Lerner, PA-C  zolpidem (AMBIEN) 10 MG tablet TAKE 1-1&1/2 TABLET AT BEDTIME AS NEEDED 10/26/13   Chelle S Jeffery, PA-C   There were no vitals taken for this visit. Physical Exam  Constitutional: She is oriented to person, place, and time. She  appears well-developed and well-nourished. No distress.  HENT:  Head: Normocephalic and atraumatic.  Mouth/Throat: Oropharynx is clear and moist.  Eyes: EOM are normal. Pupils are equal, round, and reactive to light.  Neck: Normal range of motion. Neck supple.  Cardiovascular: Normal rate and regular rhythm.  Exam reveals no friction rub.   No murmur heard. Pulmonary/Chest: Effort normal and breath sounds normal. No respiratory distress. She has no wheezes. She has no rales.  Abdominal: Soft. She exhibits no distension. There is no tenderness. There is no rebound.  Musculoskeletal: Normal range of motion. She exhibits no edema.  Neurological: She is alert and oriented to person, place, and time. A cranial nerve deficit (R facial droop) and sensory deficit (R arm and leg) is present. She exhibits abnormal muscle tone (R arm and leg decreased tone). GCS eye subscore is 4. GCS verbal subscore is 5. GCS motor subscore is 6.  Skin: She is not diaphoretic.  Nursing note and vitals reviewed.   ED Course  Procedures (including critical care time) Labs Review Labs Reviewed  CBG MONITORING, ED  Rosezena Sensor, ED    Imaging Review Dg  Chest 2 View  11/24/2013   CLINICAL DATA:  RIGHT-sided facial droop beginning at 1845 hr today. History of RIGHT-sided Bell's palsy and strokes.  EXAM: CHEST  2 VIEW  COMPARISON:  Chest radiograph October 31, 2013  FINDINGS: The heart size and mediastinal contours are within normal limits. Mild bronchitic changes. Both lungs are clear. The visualized skeletal structures are unremarkable.  IMPRESSION: Mild chronic bronchitic changes without focal consolidation.   Electronically Signed   By: Awilda Metro   On: 11/24/2013 21:40   Ct Head (brain) Wo Contrast  11/24/2013   CLINICAL DATA:  Code stroke, right facial weakness, right facial droop  EXAM: CT HEAD WITHOUT CONTRAST  TECHNIQUE: Contiguous axial images were obtained from the base of the skull through the vertex without intravenous contrast.  COMPARISON:  MRI brain 08/13/2013, CT brain 05/23/2012  FINDINGS: There is no evidence of mass effect, midline shift or extra-axial fluid collections. There is no evidence of a space-occupying lesion or intracranial hemorrhage. There is no evidence of a cortical-based area of acute infarction.  The ventricles and sulci are appropriate for the patient's age. The basal cisterns are patent.  Visualized portions of the orbits are unremarkable. The visualized portions of the paranasal sinuses and mastoid air cells are unremarkable.  The osseous structures are unremarkable.  IMPRESSION: Normal CT of the brain without intravenous contrast.  These results were called by telephone at the time of interpretation on 11/24/2013 at 8:06 pm to Dr. Roseanne Reno, who verbally acknowledged these results.   Electronically Signed   By: Elige Ko   On: 11/24/2013 20:07     EKG Interpretation   Date/Time:  Tuesday November 24 2013 20:06:11 EST Ventricular Rate:  75 PR Interval:  168 QRS Duration: 87 QT Interval:  372 QTC Calculation: 415 R Axis:   65 Text Interpretation:  Sinus rhythm Similar to prior Confirmed by Gwendolyn Grant    MD, Madesyn Ast ([id number]) on 11/24/2013 8:25:04 PM      MDM   Final diagnoses:  Cough  Right arm numbness  Right leg numbness  Right sided weakness  Facial droop    48 year old female sent from urgent care for right-sided facial drooping and right sided weakness. She has recently been treated for pneumonia but has been feeling bad for the past 3 or 4 days. Mother was taken her to urgent  care because she didn't feel well. When in the car about 1 hour and 20 minutes ago, she noticed right-sided numbness, weakness, right-sided facial drooping. Mother is with patient since she had some garbled speech also. 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 She had surgery to correct this. This was all done in [place]. She states she did receive TPA when she had her initial stroke. She also had history of right-sided Bell's palsy. Patient here with right facial droop, right arm and leg weakness. Right-sided numbness also is prevailing. Code stroke initiated. Sugar 59, given amp of D50. Per EMS, recent sugar was 81. CT negative. Admitted for stroke w/u.    Elwin MochaBlair Sincerity Cedar, MD 11/24/13 [phone]

## 2013-11-24 NOTE — H&P (Signed)
[facility] History and Physical  Diana Amslerraci Yandell ZOX:[id number] DOB: 02-May-1965 DOA: 11/24/2013  Referring physician: EDP PCP: JEFFERY,CHELLE, PA-C   Chief Complaint: R facial droop   HPI: Diana Benson is a 48 y.o. female brought in by EMS today for sudden onset R facial droop.  She reports a history of TIA in December of 2008 with left sided CEA done in [place], she does have a neck scar c/w this.  Patient also reports slurred speech today.  Not on antiplatelet therapy.  CT head is negative, and also shows no signs of old stroke interestingly enough.  Review of Systems: Systems reviewed.  As above, otherwise negative  Past Medical History  Diagnosis Date  . COPD (chronic obstructive pulmonary disease) with chronic bronchitis   . CVA (cerebral vascular accident) December 2008    Status post left-sided CAE  . GERD (gastroesophageal reflux disease)   . Asthma   . ADHD (attention deficit hyperactivity disorder)   . Anxiety   . Depression   . Fibromyalgia   . Tobacco use disorder, continuous 01/09/2013  . PFO (patent foramen ovale) 01/09/2013  . Coronary artery calcification seen on CAT scan 01/09/2013   Past Surgical History  Procedure Laterality Date  . Appendectomy    . Abdominal hysterectomy    . Gastric bypass  05/21/2001    Roux-en-Y[place]  . Tonsillectomy    . Cosmetic surgery      Post GOP  . Carotid endarte[facility] December 2008    [facility]   Social History:  reports that she quit smoking about 3 years ago. Her smoking use included Cigarettes. She has a 17 pack-year smoking history. She has never used smokeless tobacco. She reports that she drinks alcohol. She reports that she does not use illicit drugs.  Allergies  Allergen Reactions  . Codeine Nausea And Vomiting  . Flexeril [Cyclobenzaprine] Other (See Comments)    "Makes me feel like I'm coming out of my skin."  . Gabapentin Swelling  . Morphine And Related Nausea And Vomiting  .  Venlafaxine Nausea Only    Family History  Problem Relation Age of Onset  . Osteoporosis Mother   . Hypertension Mother   . Stroke Father   . Drug abuse Daughter   . Alzheimer's disease Paternal Grandmother   . Transient ischemic attack Paternal Grandmother   . Acute myelogenous leukemia Paternal Grandfather   . Heart Problems Brother     ?murmur     Prior to Admission medications   Medication Sig Start Date End Date Taking? Authorizing Provider  ALPRAZolam Prudy Feeler(XANAX) 1 MG tablet Take 1 tablet (1 mg total) by mouth 3 (three) times daily as needed for anxiety. May fill 60 days after date on prescription. 09/16/13  Yes Chelle S Jeffery, PA-C  amphetamine-dextroamphetamine (ADDERALL XR) 30 MG 24 hr capsule 2 tablets qam and 1 q evening. BRAND MEDICALLY NECESSARY.May fill 60 days after date on prescription. 09/16/13  Yes Chelle S Jeffery, PA-C  CALCIUM PO Take 2 tablets by mouth daily.   Yes Historical Provider, MD  DULoxetine (CYMBALTA) 60 MG capsule TAKE 1 CAPSULE (60 MG TOTAL) BY MOUTH 2 (TWO) TIMES DAILY. 10/08/13  Yes Chelle S Jeffery, PA-C  esomeprazole (NEXIUM) 40 MG capsule Take 1 capsule (40 mg total) by mouth 2 (two) times daily. 06/16/13  Yes Chelle Tessa LernerS Jeffery, PA-C  estrogen-methylTESTOSTERone (EST ESTROGENS-METHYLTEST HS) 0.625-1.25 MG per tablet Take 1 tablet daily 06/16/13  Yes Chelle S Jeffery, PA-C  fluticasone (FLONASE) 50  MCG/ACT nasal spray PLACE 2 SPRAYS IN each nostril DAILY 06/16/13  Yes Chelle S Jeffery, PA-C  Fluticasone-Salmeterol (ADVAIR) 500-50 MCG/DOSE AEPB Inhale 1 puff into the lungs 2 (two) times daily. 06/16/13  Yes Chelle S Jeffery, PA-C  montelukast (SINGULAIR) 10 MG tablet Take 1 tablet (10 mg total) by mouth 1 day or 1 dose. 06/16/13  Yes Chelle S Jeffery, PA-C  valACYclovir (VALTREX) 1000 MG tablet Take 1 tablet (1,000 mg total) by mouth daily. 06/16/13  Yes Chelle S Jeffery, PA-C  zolpidem (AMBIEN) 10 MG tablet Take 1-1.5 tablets (10-15 mg total) by mouth at bedtime  as needed. 10/07/13  Yes Chelle S Jeffery, PA-C  albuterol (PROAIR HFA) 108 (90 BASE) MCG/ACT inhaler Inhale 2 puffs into the lungs every 4 (four) hours as needed for wheezing or shortness of breath. 06/16/13   Chelle S Jeffery, PA-C  albuterol (PROVENTIL) (5 MG/ML) 0.5% nebulizer solution Take 0.5 mLs (2.5 mg total) by nebulization every 6 (six) hours as needed. 09/18/12   Fernande Bras, PA-C   Physical Exam: Filed Vitals:   11/24/13 2300  BP: 126/86  Pulse: 85  Temp:   Resp:     BP 126/86 mmHg  Pulse 85  Temp(Src) 98.2 F (36.8 C) (Oral)  Resp 16  Ht 5\' 3"  (1.6 m)  Wt 68.493 kg (151 lb)  BMI 26.76 kg/m2  SpO2 99%  General Appearance:    Alert, oriented, no distress, appears stated age  Head:    Normocephalic, atraumatic  Eyes:    PERRL, EOMI, sclera non-icteric        Nose:   Nares without drainage or epistaxis. Mucosa, turbinates normal  Throat:   Moist mucous membranes. Oropharynx without erythema or exudate.  Neck:   Supple. No carotid bruits.  No thyromegaly.  No lymphadenopathy.   Back:     No CVA tenderness, no spinal tenderness  Lungs:     Clear to auscultation bilaterally, without wheezes, rhonchi or rales  Chest wall:    No tenderness to palpitation  Heart:    Regular rate and rhythm without murmurs, gallops, rubs  Abdomen:     Soft, non-tender, nondistended, normal bowel sounds, no organomegaly  Genitalia:    deferred  Rectal:    deferred  Extremities:   No clubbing, cyanosis or edema.  Pulses:   2+ and symmetric all extremities  Skin:   Skin color, texture, turgor normal, no rashes or lesions  Lymph nodes:   Cervical, supraclavicular, and axillary nodes normal  Neurologic:   CNII-XII intact. Normal strength, sensation and reflexes      throughout    Labs on Admission:  Basic Metabolic Panel:  Recent Labs Lab 11/24/13 2030  NA 139  K 3.9  CL 105  CO2 24  GLUCOSE 210*  BUN 7  CREATININE 0.52  CALCIUM 7.8*   Liver Function Tests:  Recent  Labs Lab 11/24/13 2030  AST 17  ALT 19  ALKPHOS 58  BILITOT <0.2*  PROT 6.0  ALBUMIN 2.8*   No results for input(s): LIPASE, AMYLASE in the last 168 hours. No results for input(s): AMMONIA in the last 168 hours. CBC:  Recent Labs Lab 11/24/13 2035  WBC 5.5  HGB 11.3*  HCT 35.5*  MCV 90.3  PLT 243   Cardiac Enzymes: No results for input(s): CKTOTAL, CKMB, CKMBINDEX, TROPONINI in the last 168 hours.  BNP (last 3 results) No results for input(s): PROBNP in the last 8760 hours. CBG:  Recent Labs Lab 11/24/13 2004  GLUCAP 153*    Radiological Exams on Admission: Dg Chest 2 View  11/24/2013   CLINICAL DATA:  RIGHT-sided facial droop beginning at 1845 hr today. History of RIGHT-sided Bell's palsy and strokes.  EXAM: CHEST  2 VIEW  COMPARISON:  Chest radiograph October 31, 2013  FINDINGS: The heart size and mediastinal contours are within normal limits. Mild bronchitic changes. Both lungs are clear. The visualized skeletal structures are unremarkable.  IMPRESSION: Mild chronic bronchitic changes without focal consolidation.   Electronically Signed   By: Awilda Metroourtnay  Bloomer   On: 11/24/2013 21:40   Ct Head (brain) Wo Contrast  11/24/2013   CLINICAL DATA:  Code stroke, right facial weakness, right facial droop  EXAM: CT HEAD WITHOUT CONTRAST  TECHNIQUE: Contiguous axial images were obtained from the base of the skull through the vertex without intravenous contrast.  COMPARISON:  MRI brain 08/13/2013, CT brain 05/23/2012  FINDINGS: There is no evidence of mass effect, midline shift or extra-axial fluid collections. There is no evidence of a space-occupying lesion or intracranial hemorrhage. There is no evidence of a cortical-based area of acute infarction.  The ventricles and sulci are appropriate for the patient's age. The basal cisterns are patent.  Visualized portions of the orbits are unremarkable. The visualized portions of the paranasal sinuses and mastoid air cells are  unremarkable.  The osseous structures are unremarkable.  IMPRESSION: Normal CT of the brain without intravenous contrast.  These results were called by telephone at the time of interpretation on 11/24/2013 at 8:06 pm to Dr. Roseanne RenoSTEWART, who verbally acknowledged these results.   Electronically Signed   By: Elige KoHetal  Patel   On: 11/24/2013 20:07    EKG: Independently reviewed.  Assessment/Plan Principal Problem:   TIA (transient ischemic attack) Active Problems:   Right arm numbness   1. TIA - suspicion for functional overlay vs true ischemic event, neurology recommends stroke work up however as TIA cannot be ruled out entirely at this point. 1. A1C, lipid profile 2. MRI, MRA 3. Carotid ultrasound 4. Echo    Code Status: Full code  Family Communication: no family in room Disposition Plan: Admit to obs   Time spent: 50 min  Curt Oatis M. [facility] Pager [phone]  If 7AM-7PM, please contact the day team taking care of the patient [email or web address] Password [email or web address] 11/24/2013, 11:26 PM

## 2013-11-24 NOTE — Progress Notes (Signed)
 Chief Complaint:  Chief Complaint  Patient presents with  . Stroke Symptoms    HPI: Diana Benson is a 48 y.o. female who is here for  CVA like sxs while in waiting room of [facility]  She was dx with Right middle lobe PNA and was not getting better so here for recheck when sxs started, she has COPD, tobacco use, and also asthma No improvement on Z pack and cough medicine,  Has had SOB and wheezing , This was within the last 30 minutes while in waiting room She has a history of left sided Bell's Palsy and also CVA with right residual weakness, she has increased slurred speech and facial drooping.  She also has had a left CEA in the past, asthma /COPD Has taken tylenol , No asa today, has taken cough meds, was dx with PNA on 10/31/13  Past Medical History  Diagnosis Date  . COPD (chronic obstructive pulmonary disease) with chronic bronchitis   . CVA (cerebral vascular accident) December 2008    Status post left-sided CAE  . GERD (gastroesophageal reflux disease)   . Asthma   . ADHD (attention deficit hyperactivity disorder)   . Anxiety   . Depression   . Fibromyalgia   . Tobacco use disorder, continuous 01/09/2013  . PFO (patent foramen ovale) 01/09/2013  . Coronary artery calcification seen on CAT scan 01/09/2013   Past Surgical History  Procedure Laterality Date  . Appendectomy    . Abdominal hysterectomy    . 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 Tonsillectomy    . Cosmetic surgery      Post GOP  . Carotid endarterectomy Left December 2008    [facility]   History   Social History  . Marital Status: Divorced    Spouse Name: N/A    Number of Children: 2  . Years of Education: 14   Occupational History  . Inventory Teacher, English as a foreign language for [employer]     [employer]   Social History Main Topics  . Smoking status: Former Smoker -- 1.00 packs/day for 17 years    Types: Cigarettes    Quit date: 06/09/2010  . Smokeless tobacco: Never Used      Comment: 1 pack of cigarettes/week (01/09/2013)  . Alcohol Use: Yes     Comment: rare  . Drug Use: No  . Sexual Activity:    Partners: Male    Birth Control/ Protection: Surgical     Comment: total hysterectomy   Other Topics Concern  . Not on file   Social History Narrative   Divorced mother of 2 (son and daughter) grandmother of one. She is actually the primary caregiver for her grandson (born 2013). She is a long-term smoker who quit for some time, and then started back again after her divorce. She is currently actively trying to stop, having cut from one and a half pack per day down to maybe 3-4 cigarettes a day.      Son (born 24) living with Dad in [place]. Almost no contact, despite her efforts, for several years.  Struggling with behavior, alcohol, etc., and realizing that his father has not been entirely honest with him about the patient.      Daughter (born 39) has had significant issues with substance abuse and prostitution, currently "on the run."  Son born 12/18/2011, is living with the patient, who filed for custody of him 10/31/2012.  She is currently living with her mother, who has several cats. She is extremely allergic to cats and is not having significant respiratory problems since moving into the house.   Family History  Problem Relation Age of Onset  . Osteoporosis Mother   . Hypertension Mother   . Stroke Father   . Drug abuse Daughter   . Alzheimer's disease Paternal Grandmother   . Transient ischemic attack Paternal Grandmother   . Acute myelogenous leukemia Paternal Grandfather   . Heart Problems Brother     ?murmur   Allergies  Allergen Reactions  . Codeine Nausea And Vomiting  . Flexeril [Cyclobenzaprine] Other (See Comments)    "Makes me feel like I'm coming out of my skin."  . Gabapentin Swelling  . Morphine And Related Nausea And Vomiting  . Venlafaxine Nausea Only   Prior to Admission medications   Medication Sig Start Date End  Date Taking? Authorizing Provider  acyclovir ointment (ZOVIRAX) 5 % Apply 1 application topically every 3 (three) hours. Apply 6x/day x 7 days, prn 06/16/13   Chelle S Jeffery, PA-C  albuterol (PROAIR HFA) 108 (90 BASE) MCG/ACT inhaler Inhale 2 puffs into the lungs every 4 (four) hours as needed for wheezing or shortness of breath. 06/16/13   Chelle S Jeffery, PA-C  albuterol (PROVENTIL) (5 MG/ML) 0.5% nebulizer solution Take 0.5 mLs (2.5 mg total) by nebulization every 6 (six) hours as needed. 09/18/12   Chelle S Jeffery, PA-C  ALPRAZolam (XANAX) 1 MG tablet Take 1 tablet (1 mg total) by mouth 3 (three) times daily as needed for anxiety. May fill 60 days after date on prescription. 09/16/13   Chelle S Jeffery, PA-C  ALPRAZolam (XANAX) 1 MG tablet Take 1 tablet (1 mg total) by mouth 3 (three) times daily as needed for anxiety. May fill 30 days after date on prescription. 09/16/13   Chelle S Jeffery, PA-C  ALPRAZolam (XANAX) 1 MG tablet Take 1 tablet (1 mg total) by mouth 3 (three) times daily as needed for anxiety. 09/16/13   Chelle S Jeffery, PA-C  amphetamine-dextroamphetamine (ADDERALL XR) 30 MG 24 hr capsule 2 tablets qam and 1 q evening. BRAND MEDICALLY NECESSARY.May fill 60 days after date on prescription. 09/16/13   Chelle S Jeffery, PA-C  amphetamine-dextroamphetamine (ADDERALL XR) 30 MG 24 hr capsule 2 tablets qam and 1 q evening. BRAND MEDICALLY NECESSARY. May fill 30 days after date on prescription. 09/16/13   Chelle S Jeffery, PA-C  amphetamine-dextroamphetamine (ADDERALL XR) 30 MG 24 hr capsule 2 tablets qam and 1 q evening. BRAND MEDICALLY NECESSARY. 09/16/13   Chelle S Jeffery, PA-C  azithromycin (ZITHROMAX) 500 MG tablet Take 1 tablet (500 mg total) by mouth daily. 10/31/13   Chelle Tessa LernerS Jeffery, PA-C  CALCIUM PO Take 2 tablets by mouth daily.    Historical Provider, MD  carisoprodol (SOMA) 350 MG tablet TAKE 1 TABLET BY MOUTH 4 TIMES DAILY 07/07/13   Chelle Tessa LernerS Jeffery, PA-C    chlorpheniramine-HYDROcodone (TUSSIONEX PENNKINETIC ER) 10-8 MG/5ML LQCR Take 5 mLs by mouth every 12 (twelve) hours as needed for cough (cough). 10/31/13   Chelle S Jeffery, PA-C  DULoxetine (CYMBALTA) 60 MG capsule TAKE 1 CAPSULE (60 MG TOTAL) BY MOUTH 2 (TWO) TIMES DAILY. 10/08/13   Chelle S Jeffery, PA-C  esomeprazole (NEXIUM) 40 MG capsule Take 1 capsule (40 mg total) by mouth 2 (two) times daily. 06/16/13   Chelle Tessa LernerS Jeffery, PA-C  estrogen-methylTESTOSTERone (EST ESTROGENS-METHYLTEST HS) 0.625-1.25 MG per tablet Take 1 tablet daily 06/16/13  Chelle S Jeffery, PA-C  fluticasone (FLONASE) 50 MCG/ACT nasal spray PLACE 2 SPRAYS IN each nostril DAILY 06/16/13   Chelle S Jeffery, PA-C  Fluticasone-Salmeterol (ADVAIR) 500-50 MCG/DOSE AEPB Inhale 1 puff into the lungs 2 (two) times daily. 06/16/13   Chelle S Jeffery, PA-C  montelukast (SINGULAIR) 10 MG tablet Take 1 tablet (10 mg total) by mouth 1 day or 1 dose. 06/16/13   Chelle Tessa LernerS Jeffery, PA-C  predniSONE (DELTASONE) 20 MG tablet Take 3 PO QAM x3days, 2 PO QAM x3days, 1 PO QAM x3days 10/31/13   Chelle S Jeffery, PA-C  valACYclovir (VALTREX) 1000 MG tablet Take 1 tablet (1,000 mg total) by mouth daily. 06/16/13   Chelle S Jeffery, PA-C  zolpidem (AMBIEN) 10 MG tablet Take 1-1.5 tablets (10-15 mg total) by mouth at bedtime as needed. May fill 30 days after date on prescription. 06/16/13   Chelle S Jeffery, PA-C  zolpidem (AMBIEN) 10 MG tablet Take 1-1.5 tablets (10-15 mg total) by mouth at bedtime as needed. May fill 60 days after date on prescription. 06/16/13   Chelle S Jeffery, PA-C  zolpidem (AMBIEN) 10 MG tablet Take 1-1.5 tablets (10-15 mg total) by mouth at bedtime as needed. 10/07/13   Chelle S Jeffery, PA-C  zolpidem (AMBIEN) 10 MG tablet TAKE 1-1&1/2 TABT AT BEDTIME AS NEEDED 10/26/13   Chelle S Jeffery, PA-C     ROS: The patient denies fevers, chills, night sweats, unintentional weight loss,  palpitations,  nausea, vomiting, abdominal pain,  dysuria, hematuria, melena,  + numbness, weakness, or tingling. CP, and also wheezing + HA  All other systems have been reviewed and were otherwise negative with the exception of those mentioned in the HPI and as above.    PHYSICAL EXAM: Filed Vitals:   11/24/13 1931  BP: 96/60  Pulse: 95   There were no vitals filed for this visit. There is no weight on file to calculate BMI.  General: Alert, no acute distress HEENT:  Normocephalic, atraumatic, oropharynx patent. EOMI, PERRLA Cardiovascular:  Regular rate and rhythm, no rubs murmurs or gallops.  No Carotid bruits, radial pulse intact. No pedal edema.  Respiratory: Clear to auscultation bilaterally.  + wheezes, neg  rales,  + rhonchi.  No cyanosis, no use of accessory musculature GI: No organomegaly, abdomen is soft and non-tender, positive bowel sounds.  No masses. Skin: No rashes. Neurologic: Facial musculature asymmetric. Right facial droop, mouth droop, slurring, drooling, right UE and  wekaness Psychiatric: Patient is appropriate throughout our interaction. Lymphatic: No cervical lymphadenopathy Musculoskeletal: Gait intact.   LABS: Results for orders placed or performed in visit on 11/24/13  POCT glucose (manual entry)  Result Value Ref Range   POC Glucose 88 70 - 99 mg/dl     EKG/XRAY:   [facility]   ASSESSMENT/PLAN: Encounter Diagnoses  Name Primary?  . Stroke-like symptoms Yes  . History of CVA (cerebrovascular accident)   . Chest pain, unspecified chest pain type   . Headache, unspecified headache type   . Facial droop   . History of Bell's palsy   . Hypotension, unspecified hypotension type    [age] y/o female with PMH of Left CEA, prior CVA with right sided residual weakness, left facial Bell's Palsy who is here for a recheck of a  recent treatment for right middle lobe without improvement when she developed stroke like sxs in the  waiting room, per mom and patient had worse  than normal  right sided facial  drooping with slurred speech, generalized and assymetric weakness, CP.  Send to ER via EMS at [facility] for further evaluation Given 2 L oxygen Stroke rule out, Further treatment and evaluation  for PNA at [facility] ER   Gross sideeffects, risk and benefits, and alternatives of medications d/w patient. Patient is aware that all medications have potential sideeffects and we are unable to predict every sideeffect or drug-drug interaction that may occur.  Hamilton Capri PHUONG, DO 11/24/2013 7:43 PM

## 2013-11-24 NOTE — ED Notes (Signed)
Ems reports pt presented with RT sided facial droop/ Rt sided weakness at approx 1845 this evening. Pt has hx multiple CVAs and bells palsy with Rt sided deficits from both. Pt a/o on arrival to ED and taken straight to CT after BG check and labs drawn.

## 2013-11-24 NOTE — ED Notes (Signed)
Patient transported to X-ray 

## 2013-11-24 NOTE — ED Notes (Signed)
Activated Code Stroke @ 18:40

## 2013-11-25 ENCOUNTER — Observation Stay (HOSPITAL_COMMUNITY): Payer: BC Managed Care – PPO

## 2013-11-25 DIAGNOSIS — G459 Transient cerebral ischemic attack, unspecified: Secondary | ICD-10-CM

## 2013-11-25 DIAGNOSIS — R29810 Facial weakness: Secondary | ICD-10-CM

## 2013-11-25 DIAGNOSIS — M6289 Other specified disorders of muscle: Secondary | ICD-10-CM

## 2013-11-25 DIAGNOSIS — I639 Cerebral infarction, unspecified: Secondary | ICD-10-CM

## 2013-11-25 LAB — HEMOGLOBIN A1C
Hgb A1c MFr Bld: 5.4 % (ref <5.7)
Mean Plasma Glucose: 108 mg/dL (ref <117)

## 2013-11-25 LAB — LIPID PANEL
CHOL/HDL RATIO: 2.2 ratio
Cholesterol: 111 mg/dL (ref 0–200)
HDL: 51 mg/dL (ref >39)
LDL CALC: 42 mg/dL (ref 0–99)
TRIGLYCERIDES: 91 mg/dL (ref <150)
VLDL: 18 mg/dL (ref 0–40)

## 2013-11-25 LAB — GLUCOSE, CAPILLARY: GLUCOSE-CAPILLARY: 59 mg/dL — AB (ref 70–99)

## 2013-11-25 MED ORDER — GUAIFENESIN-DM 100-10 MG/5ML PO SYRP
5.0000 mL | ORAL_SOLUTION | ORAL | Status: DC | PRN
  Administered 2013-11-25: 5 mL via ORAL
  Filled 2013-11-25: qty 5

## 2013-11-25 MED ORDER — LORAZEPAM 2 MG/ML IJ SOLN
2.0000 mg | Freq: Once | INTRAMUSCULAR | Status: AC
  Administered 2013-11-25: 2 mg via INTRAVENOUS
  Filled 2013-11-25: qty 1

## 2013-11-25 MED ORDER — ZOLPIDEM TARTRATE 5 MG PO TABS
5.0000 mg | ORAL_TABLET | Freq: Every evening | ORAL | Status: DC | PRN
  Administered 2013-11-25 (×2): 5 mg via ORAL
  Filled 2013-11-25 (×2): qty 1

## 2013-11-25 MED ORDER — HYDROCOD POLST-CHLORPHEN POLST 10-8 MG/5ML PO LQCR: 5.0000 mL | Freq: Once | ORAL | Status: DC

## 2013-11-25 NOTE — Plan of Care (Signed)
Problem: Consults Goal: Skin Care Protocol Initiated - if Braden Score 18 or less If consults are not indicated, leave blank or document N/A  Outcome: Not Applicable Date Met:  11/25/13 Goal: Diabetes Guidelines if Diabetic/Glucose > 140 If diabetic or lab glucose is > 140 mg/dl - Initiate Diabetes/Hyperglycemia Guidelines & Document Interventions  Outcome: Not Applicable Date Met:  11/25/13  Problem: Acute Treatment Outcomes Goal: Prognosis discussed with family/patient as appropriate Outcome: Completed/Met Date Met:  11/25/13  Problem: Progression Outcomes Goal: Progressive activity as tolerated Outcome: Progressing Steady gait. Bedrest with BRP. Goal: Tolerating diet/TF at goal rate Outcome: Progressing Fair appetite. Able to feed self. Goal: Bowel & Bladder Continence Outcome: Completed/Met Date Met:  11/25/13 Voiding without difficulty     

## 2013-11-25 NOTE — Progress Notes (Signed)
[facility] PROGRESS NOTE  Diana Benson WUJ:[id number] DOB: May 24, 1965 DOA: 11/24/2013 PCP: JEFFERY,CHELLE, PA-C  Assessment/Plan: TIA No residual symptoms (right facial droop and dysarthria) at present however feels very weak. CT head and MRI negative for acute stroke. Started on full dose aspirin. LDL of 62. Appreciate neurology recommendations. 2-D echo with normal EF and no wall motion abnormality and carotid Doppler with nonsignificant stenosis. Follow A1c. Ordered PT eval Possible discharge in a.m. if asymptomatic   COPD Stable. Continue Advair and albuterol inhaler. Continue Singulair. Continue albuterol neb when necessary  Study of ADHD Continue Adderall  GERD Continue Nexium    Code Status: Full code Family Communication: None at bedside Disposition Plan: Home likely in a.m.   Consultants:  Neurology  Procedures:  MRI brain  2-D echo  Carotid Doppler  Antibiotics:  None  HPI/Subjective: Incision and examined. Right facial droop and dysarthria resolved but still complains of generalized weakness  Objective: Filed Vitals:   11/25/13 1155  BP: 114/68  Pulse: 66  Temp: 97.9 F (36.6 C)  Resp: 17    Intake/Output Summary (Last 24 hours) at 11/25/13 1759 Last data filed at 11/25/13 1700  Gross per 24 hour  Intake    840 ml  Output      0 ml  Net    840 ml   Filed Weights   11/24/13 2007 11/25/13 0004  Weight: 68.493 kg (151 lb) 66.815 kg (147 lb 4.8 oz)    Exam:   General: Middle aged female in no acute distress, flat affect  HEENT: No pallor, moist oral mucosa  Chest: Clear to auscultation bilaterally, no added sounds  CVS: Normal S1 and S2, no murmurs  Abdomen: Soft, nondistended, nontender, bowel sounds present X line is images: Warm, no edema  CNS: Alert and oriented, appears fatigued, no focal deficit    Data Reviewed: Basic Metabolic Panel:  Recent Labs Lab 11/24/13 2030  NA 139  K 3.9  CL 105  CO2 24   GLUCOSE 210*  BUN 7  CREATININE 0.52  CALCIUM 7.8*   Liver Function Tests:  Recent Labs Lab 11/24/13 2030  AST 17  ALT 19  ALKPHOS 58  BILITOT <0.2*  PROT 6.0  ALBUMIN 2.8*   No results for input(s): LIPASE, AMYLASE in the last 168 hours. No results for input(s): AMMONIA in the last 168 hours. CBC:  Recent Labs Lab 11/24/13 2035  WBC 5.5  HGB 11.3*  HCT 35.5*  MCV 90.3  PLT 243   Cardiac Enzymes: No results for input(s): CKTOTAL, CKMB, CKMBINDEX, TROPONINI in the last 168 hours. BNP (last 3 results) No results for input(s): PROBNP in the last 8760 hours. CBG:  Recent Labs Lab 11/24/13 1942 11/24/13 2004  GLUCAP 59* 153*    No results found for this or any previous visit (from the past 240 hour(s)).   Studies: Dg Chest 2 View  11/24/2013   CLINICAL DATA:  RIGHT-sided facial droop beginning at 1845 hr today. History of RIGHT-sided Bell's palsy and strokes.  EXAM: CHEST  2 VIEW  COMPARISON:  Chest radiograph October 31, 2013  FINDINGS: The heart size and mediastinal contours are within normal limits. Mild bronchitic changes. Both lungs are clear. The visualized skeletal structures are unremarkable.  IMPRESSION: Mild chronic bronchitic changes without focal consolidation.   Electronically Signed   By: Diana Metroourtnay  Benson   On: 11/24/2013 21:40   Ct Head (brain) Wo Contrast  11/24/2013   CLINICAL DATA:  Code stroke, right facial weakness, right  facial droop  EXAM: CT HEAD WITHOUT CONTRAST  TECHNIQUE: Contiguous axial images were obtained from the base of the skull through the vertex without intravenous contrast.  COMPARISON:  MRI brain 08/13/2013, CT brain 05/23/2012  FINDINGS: There is no evidence of mass effect, midline shift or extra-axial fluid collections. There is no evidence of a space-occupying lesion or intracranial hemorrhage. There is no evidence of a cortical-based area of acute infarction.  The ventricles and sulci are appropriate for the patient's age. The  basal cisterns are patent.  Visualized portions of the orbits are unremarkable. The visualized portions of the paranasal sinuses and mastoid air cells are unremarkable.  The osseous structures are unremarkable.  IMPRESSION: Normal CT of the brain without intravenous contrast.  These results were called by telephone at the time of interpretation on 11/24/2013 at 8:06 pm to Dr. Roseanne RenoSTEWART, who verbally acknowledged these results.   Electronically Signed   By: Diana KoHetal  Benson   On: 11/24/2013 20:07   Mr Brain Wo Contrast  11/25/2013   CLINICAL DATA:  New onset slurred speech.  TIA  EXAM: MRI HEAD WITHOUT CONTRAST  MRA HEAD WITHOUT CONTRAST  TECHNIQUE: Multiplanar, multiecho pulse sequences of the brain and surrounding structures were obtained without intravenous contrast. Angiographic images of the head were obtained using MRA technique without contrast.  COMPARISON:  CT 11/24/2013  FINDINGS: MRI HEAD FINDINGS  Ventricle size is normal. Cerebral volume is normal. Pituitary normal in size. Negative for acute infarct. Small white matter hyperintensities bilaterally. Hyperintensity in the pons bilaterally. These findings are most consistent with chronic microvascular ischemia in this patient with known atherosclerotic disease. Negative for intracranial hemorrhage. Negative for mass or edema. Paranasal sinuses are clear.  MRA HEAD FINDINGS  Both vertebral arteries are widely patent. PICA patent bilaterally. Basilar widely patent. AICA, superior cerebellar, posterior cerebral arteries widely patent without stenosis.  Internal carotid artery widely patent bilaterally. Anterior and middle cerebral arteries widely patent without stenosis  Negative for cerebral aneurysm.  IMPRESSION: Negative for acute infarct. Chronic microvascular ischemic change in the white matter and pons.  Negative MRA head   Electronically Signed   By: Diana Palauharles  Clark M.D.   On: 11/25/2013 09:45   Mr Maxine GlennMra Head/brain Wo Cm  11/25/2013   CLINICAL DATA:   New onset slurred speech.  TIA  EXAM: MRI HEAD WITHOUT CONTRAST  MRA HEAD WITHOUT CONTRAST  TECHNIQUE: Multiplanar, multiecho pulse sequences of the brain and surrounding structures were obtained without intravenous contrast. Angiographic images of the head were obtained using MRA technique without contrast.  COMPARISON:  CT 11/24/2013  FINDINGS: MRI HEAD FINDINGS  Ventricle size is normal. Cerebral volume is normal. Pituitary normal in size. Negative for acute infarct. Small white matter hyperintensities bilaterally. Hyperintensity in the pons bilaterally. These findings are most consistent with chronic microvascular ischemia in this patient with known atherosclerotic disease. Negative for intracranial hemorrhage. Negative for mass or edema. Paranasal sinuses are clear.  MRA HEAD FINDINGS  Both vertebral arteries are widely patent. PICA patent bilaterally. Basilar widely patent. AICA, superior cerebellar, posterior cerebral arteries widely patent without stenosis.  Internal carotid artery widely patent bilaterally. Anterior and middle cerebral arteries widely patent without stenosis  Negative for cerebral aneurysm.  IMPRESSION: Negative for acute infarct. Chronic microvascular ischemic change in the white matter and pons.  Negative MRA head   Electronically Signed   By: Diana Palauharles  Clark M.D.   On: 11/25/2013 09:45    Scheduled Meds: . aspirin EC  325 mg Oral Daily  .  DULoxetine  60 mg Oral BID  . estrogen-methylTESTOSTERone  1 tablet Oral Daily  . fluticasone  2 spray Each Nare Daily  . heparin  5,000 Units Subcutaneous 3 times per day  . mometasone-formoterol  2 puff Inhalation BID  . montelukast  10 mg Oral QHS  . pantoprazole  80 mg Oral Q1200  . valACYclovir  1,000 mg Oral Daily   Continuous Infusions:     Time spent: 25 minutes    Itzelle Gains  [facility] Pager [phone]. If 7PM-7AM, please contact night-coverage at [email or web address], password [facility] 11/25/2013, 5:59 PM  LOS: 1 day

## 2013-11-25 NOTE — Progress Notes (Signed)
  Echocardiogram 2D Echocardiogram has been performed.  Tonie Elsey FRANCES 11/25/2013, 12:58 PM

## 2013-11-25 NOTE — Progress Notes (Signed)
STROKE TEAM PROGRESS NOTE   HISTORY Diana Benson is an 48 y.o. female history of stroke in 2008, COPD, depression and anxiety, PFO, fibromyalgia and attention deficit disorder, presenting with new onset right facial droop and slurred speech which started at 7:25 PM this evening 11/24/2013. Patient has not been on antiplatelet therapy. She was hospitalized in [place] in 2008 when she reportedly had a stroke. She is status post open angioplasty involving left carotid artery. CT scan of her head was normal with no signs of acute hemorrhage and no indication of old stroke. NIH stroke score was 3 with subjective sensory changes and drift of right upper and lower extremities. Facial droop was intermittent and functional appearance when present. Patient was not administered TPA secondary to minimal deficits with suspected functional overlay. She was admitted for further evaluation and treatment.   SUBJECTIVE (INTERVAL HISTORY) No family is at the bedside.  She is sleeping soundly after receiving ativan for MRI. She is easily awakened. Overall she feels her condition is stable.    OBJECTIVE Temp:  [97.7 F (36.5 C)-98.8 F (37.1 C)] 97.7 F (36.5 C) (11/25 0454) Pulse Rate:  [62-95] 62 (11/25 0921) Cardiac Rhythm:  [-]  Resp:  [15-22] 15 (11/25 0921) BP: (96-135)/(59-86) 113/69 mmHg (11/25 0921) SpO2:  [96 %-100 %] 97 % (11/25 0921) Weight:  [66.815 kg (147 lb 4.8 oz)-68.493 kg (151 lb)] 66.815 kg (147 lb 4.8 oz) (11/25 0004)   Recent Labs Lab 11/24/13 1942 11/24/13 2004  GLUCAP 59* 153*    Recent Labs Lab 11/24/13 2030  NA 139  K 3.9  CL 105  CO2 24  GLUCOSE 210*  BUN 7  CREATININE 0.52  CALCIUM 7.8*    Recent Labs Lab 11/24/13 2030  AST 17  ALT 19  ALKPHOS 58  BILITOT <0.2*  PROT 6.0  ALBUMIN 2.8*    Recent Labs Lab 11/24/13 2035  WBC 5.5  HGB 11.3*  HCT 35.5*  MCV 90.3  PLT 243   No results for input(s): CKTOTAL, CKMB, CKMBINDEX, TROPONINI in the last 168  hours. No results for input(s): LABPROT, INR in the last 72 hours. No results for input(s): COLORURINE, LABSPEC, PHURINE, GLUCOSEU, HGBUR, BILIRUBINUR, KETONESUR, PROTEINUR, UROBILINOGEN, NITRITE, LEUKOCYTESUR in the last 72 hours.  Invalid input(s): APPERANCEUR     Component Value Date/Time   CHOL 111 11/25/2013 0411   TRIG 91 11/25/2013 0411   HDL 51 11/25/2013 0411   CHOLHDL 2.2 11/25/2013 0411   VLDL 18 11/25/2013 0411   LDLCALC 42 11/25/2013 0411   Lab Results  Component Value Date   HGBA1C 5.7* 06/12/2010      Component Value Date/Time   LABOPIA POSITIVE* 06/12/2010 1230   COCAINSCRNUR NONE DETECTED 06/12/2010 1230   LABBENZ POSITIVE* 06/12/2010 1230   AMPHETMU NONE DETECTED 06/12/2010 1230   THCU NONE DETECTED 06/12/2010 1230   LABBARB NONE DETECTED 06/12/2010 1230    No results for input(s): ETH in the last 168 hours.  Dg Chest 2 View  11/24/2013   CLINICAL DATA:  RIGHT-sided facial droop beginning at 1845 hr today. History of RIGHT-sided Bell's palsy and strokes.  EXAM: CHEST  2 VIEW  COMPARISON:  Chest radiograph October 31, 2013  FINDINGS: The heart size and mediastinal contours are within normal limits. Mild bronchitic changes. Both lungs are clear. The visualized skeletal structures are unremarkable.  IMPRESSION: Mild chronic bronchitic changes without focal consolidation.   Electronically Signed   By: Awilda Metroourtnay  Bloomer   On: 11/24/2013 21:40  Ct Head (brain) Wo Contrast  11/24/2013   CLINICAL DATA:  Code stroke, right facial weakness, right facial droop  EXAM: CT HEAD WITHOUT CONTRAST  TECHNIQUE: Contiguous axial images were obtained from the base of the skull through the vertex without intravenous contrast.  COMPARISON:  MRI brain 08/13/2013, CT brain 05/23/2012  FINDINGS: There is no evidence of mass effect, midline shift or extra-axial fluid collections. There is no evidence of a space-occupying lesion or intracranial hemorrhage. There is no evidence of a  cortical-based area of acute infarction.  The ventricles and sulci are appropriate for the patient's age. The basal cisterns are patent.  Visualized portions of the orbits are unremarkable. The visualized portions of the paranasal sinuses and mastoid air cells are unremarkable.  The osseous structures are unremarkable.  IMPRESSION: Normal CT of the brain without intravenous contrast.  These results were called by telephone at the time of interpretation on 11/24/2013 at 8:06 pm to Dr. Roseanne Reno, who verbally acknowledged these results.   Electronically Signed   By: Elige Ko   On: 11/24/2013 20:07   Mr Brain Wo Contrast  11/25/2013   CLINICAL DATA:  New onset slurred speech.  TIA  EXAM: MRI HEAD WITHOUT CONTRAST  MRA HEAD WITHOUT CONTRAST  TECHNIQUE: Multiplanar, multiecho pulse sequences of the brain and surrounding structures were obtained without intravenous contrast. Angiographic images of the head were obtained using MRA technique without contrast.  COMPARISON:  CT 11/24/2013  FINDINGS: MRI HEAD FINDINGS  Ventricle size is normal. Cerebral volume is normal. Pituitary normal in size. Negative for acute infarct. Small white matter hyperintensities bilaterally. Hyperintensity in the pons bilaterally. These findings are most consistent with chronic microvascular ischemia in this patient with known atherosclerotic disease. Negative for intracranial hemorrhage. Negative for mass or edema. Paranasal sinuses are clear.  MRA HEAD FINDINGS  Both vertebral arteries are widely patent. PICA patent bilaterally. Basilar widely patent. AICA, superior cerebellar, posterior cerebral arteries widely patent without stenosis.  Internal carotid artery widely patent bilaterally. Anterior and middle cerebral arteries widely patent without stenosis  Negative for cerebral aneurysm.  IMPRESSION: Negative for acute infarct. Chronic microvascular ischemic change in the white matter and pons.  Negative MRA head   Electronically Signed    By: Marlan Palau M.D.   On: 11/25/2013 09:45   Mr Maxine Glenn Head/brain Wo Cm  11/25/2013   CLINICAL DATA:  New onset slurred speech.  TIA  EXAM: MRI HEAD WITHOUT CONTRAST  MRA HEAD WITHOUT CONTRAST  TECHNIQUE: Multiplanar, multiecho pulse sequences of the brain and surrounding structures were obtained without intravenous contrast. Angiographic images of the head were obtained using MRA technique without contrast.  COMPARISON:  CT 11/24/2013  FINDINGS: MRI HEAD FINDINGS  Ventricle size is normal. Cerebral volume is normal. Pituitary normal in size. Negative for acute infarct. Small white matter hyperintensities bilaterally. Hyperintensity in the pons bilaterally. These findings are most consistent with chronic microvascular ischemia in this patient with known atherosclerotic disease. Negative for intracranial hemorrhage. Negative for mass or edema. Paranasal sinuses are clear.  MRA HEAD FINDINGS  Both vertebral arteries are widely patent. PICA patent bilaterally. Basilar widely patent. AICA, superior cerebellar, posterior cerebral arteries widely patent without stenosis.  Internal carotid artery widely patent bilaterally. Anterior and middle cerebral arteries widely patent without stenosis  Negative for cerebral aneurysm.  IMPRESSION: Negative for acute infarct. Chronic microvascular ischemic change in the white matter and pons.  Negative MRA head   Electronically Signed   By: Delorise Jackson.D.  On: 11/25/2013 09:45    PHYSICAL EXAM Pleasant middle-aged Caucasian lady not in distress.Awake alert. Afebrile. Head is nontraumatic. Neck is supple without bruit. Hearing is normal. Cardiac exam no murmur or gallop. Lungs are clear to auscultation. Distal pulses are well felt. Neurological Exam : Awake alert oriented x 3 normal speech and language. Mild right lower face asymmetry on smiling. Tongue midline. No drift. Mild diminished fine finger movements on  right. Orbits left over right upper extremity. Mild right  grip weak.. Normal sensation . Normal coordination. ASSESSMENT/PLAN [name] is a 48 y.o. female with history of history of stroke in 2008, COPD, depression and anxiety, PFO, fibromyalgia and attention deficit disorder, presenting with new onset right facial droop and slurred speech. She did not receive IV t-PA due to minimal deficits and suspected functional overlay.   Left brain TIA  Resultant  Right facial droop and dysarthria resolved  MRI  No acute stroke  MRA  Unremarkable   Carotid Doppler  pending   2D Echo  pending   LDL 42  HgbA1c pending  Heparin 5000 units sq tid for VTE prophylaxis  Diet regular thin liquids  no antithrombotics prior to admission, now on aspirin 325 mg orally every day  Ongoing aggressive stroke risk factor management  Therapy recommendations:  pending   Disposition:  pending   Other Stroke Risk Factors  Cigarette smoker, advised to stop smoking  ETOH use, rare  Hx stroke/TIA  Family hx stroke (father)  Coronary artery disease  PFO  Other Active Problems  ADHD  Fibromyalgia  COPD/asthma  Other Pertinent History  L CEA 12/2006  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 I have made any additions or clarifications directly to the above note. Agree with note above. Suspect left brain TIA/small subcortical infarct not seen on MRI -etiology small vessel disease likelyStroke workup is ensuing  Delia HeadyPramod Sethi, MD Medical Director [facility] Pager: [phone] 11/25/2013 12:43 PM   To contact Stroke Continuity provider, please refer to [email or web address]. After hours, contact General  Neurology

## 2013-11-25 NOTE — Progress Notes (Signed)
UR completed 

## 2013-11-25 NOTE — Progress Notes (Signed)
*  PRELIMINARY RESULTS* Vascular Ultrasound Carotid Duplex (Doppler) has been completed.  Findings suggest 1-39% internal carotid artery stenosis bilaterally. Vertebral arteries are patent with antegrade flow.  11/25/2013 12:45 PM Gertie FeyMichelle Galilee Pierron, RVT, RDCS, RDMS

## 2013-11-26 DIAGNOSIS — F418 Other specified anxiety disorders: Secondary | ICD-10-CM

## 2013-11-26 DIAGNOSIS — Z720 Tobacco use: Secondary | ICD-10-CM

## 2013-11-26 MED ORDER — NICOTINE 7 MG/24HR TD PT24: 7.0000 mg | MEDICATED_PATCH | Freq: Every day | TRANSDERMAL | Status: DC

## 2013-11-26 MED ORDER — ASPIRIN 325 MG PO TBEC: 325.0000 mg | DELAYED_RELEASE_TABLET | Freq: Every day | ORAL | Status: DC

## 2013-11-26 MED ORDER — INFLUENZA VAC SPLIT QUAD 0.5 ML IM SUSY: 0.5000 mL | PREFILLED_SYRINGE | INTRAMUSCULAR | Status: DC

## 2013-11-26 MED ORDER — GUAIFENESIN-DM 100-10 MG/5ML PO SYRP: 5.0000 mL | ORAL_SOLUTION | ORAL | Status: DC | PRN

## 2013-11-26 NOTE — Progress Notes (Signed)
STROKE TEAM PROGRESS NOTE   HISTORY Diana Benson is an 48 y.o. female history of stroke in 2008, COPD, depression and anxiety, PFO, fibromyalgia and attention deficit disorder, presenting with new onset right facial droop and slurred speech which started at 7:25 PM this evening 11/24/2013. Patient has not been on antiplatelet therapy. She was hospitalized in [place] in 2008 when she reportedly had a stroke. She is status post open angioplasty involving left carotid artery. CT scan of her head was normal with no signs of acute hemorrhage and no indication of old stroke. NIH stroke score was 3 with subjective sensory changes and drift of right upper and lower extremities. Facial droop was intermittent and functional appearance when present. Patient was not administered TPA secondary to minimal deficits with suspected functional overlay. She was admitted for further evaluation and treatment.   SUBJECTIVE (INTERVAL HISTORY) No family is at the bedside.  She is getting dressed to go home. Overall she feels her condition is stable.    OBJECTIVE Temp:  [97.8 F (36.6 C)] 97.8 F (36.6 C) (11/26 0535) Pulse Rate:  [75] 75 (11/26 0535) Cardiac Rhythm:  [-] Normal sinus rhythm (11/26 0840) Resp:  [16] 16 (11/26 0535) BP: (106)/(76) 106/76 mmHg (11/26 0535) SpO2:  [94 %-96 %] 94 % (11/26 0818) Weight:  [150 lb (68.04 kg)] 150 lb (68.04 kg) (11/26 0535)   Recent Labs Lab 11/24/13 1942 11/24/13 2004  GLUCAP 59* 153*    Recent Labs Lab 11/24/13 2030  NA 139  K 3.9  CL 105  CO2 24  GLUCOSE 210*  BUN 7  CREATININE 0.52  CALCIUM 7.8*    Recent Labs Lab 11/24/13 2030  AST 17  ALT 19  ALKPHOS 58  BILITOT <0.2*  PROT 6.0  ALBUMIN 2.8*    Recent Labs Lab 11/24/13 2035  WBC 5.5  HGB 11.3*  HCT 35.5*  MCV 90.3  PLT 243   No results for input(s): CKTOTAL, CKMB, CKMBINDEX, TROPONINI in the last 168 hours. No results for input(s): LABPROT, INR in the last 72 hours. No results for  input(s): COLORURINE, LABSPEC, PHURINE, GLUCOSEU, HGBUR, BILIRUBINUR, KETONESUR, PROTEINUR, UROBILINOGEN, NITRITE, LEUKOCYTESUR in the last 72 hours.  Invalid input(s): APPERANCEUR     Component Value Date/Time   CHOL 111 11/25/2013 0411   TRIG 91 11/25/2013 0411   HDL 51 11/25/2013 0411   CHOLHDL 2.2 11/25/2013 0411   VLDL 18 11/25/2013 0411   LDLCALC 42 11/25/2013 0411   Lab Results  Component Value Date   HGBA1C 5.4 11/25/2013      Component Value Date/Time   LABOPIA POSITIVE* 06/12/2010 1230   COCAINSCRNUR NONE DETECTED 06/12/2010 1230   LABBENZ POSITIVE* 06/12/2010 1230   AMPHETMU NONE DETECTED 06/12/2010 1230   THCU NONE DETECTED 06/12/2010 1230   LABBARB NONE DETECTED 06/12/2010 1230    No results for input(s): ETH in the last 168 hours.  Mr Brain Wo Contrast  11/25/2013   CLINICAL DATA:  New onset slurred speech.  TIA  EXAM: MRI HEAD WITHOUT CONTRAST  MRA HEAD WITHOUT CONTRAST  TECHNIQUE: Multiplanar, multiecho pulse sequences of the brain and surrounding structures were obtained without intravenous contrast. Angiographic images of the head were obtained using MRA technique without contrast.  COMPARISON:  CT 11/24/2013  FINDINGS: MRI HEAD FINDINGS  Ventricle size is normal. Cerebral volume is normal. Pituitary normal in size. Negative for acute infarct. Small white matter hyperintensities bilaterally. Hyperintensity in the pons bilaterally. These findings are most consistent with chronic microvascular ischemia  in this patient with known atherosclerotic disease. Negative for intracranial hemorrhage. Negative for mass or edema. Paranasal sinuses are clear.  MRA HEAD FINDINGS  Both vertebral arteries are widely patent. PICA patent bilaterally. Basilar widely patent. AICA, superior cerebellar, posterior cerebral arteries widely patent without stenosis.  Internal carotid artery widely patent bilaterally. Anterior and middle cerebral arteries widely patent without stenosis  Negative  for cerebral aneurysm.  IMPRESSION: Negative for acute infarct. Chronic microvascular ischemic change in the white matter and pons.  Negative MRA head   Electronically Signed   By: Marlan Palauharles  Clark M.D.   On: 11/25/2013 09:45   Mr Maxine GlennMra Head/brain Wo Cm  11/25/2013   CLINICAL DATA:  New onset slurred speech.  TIA  EXAM: MRI HEAD WITHOUT CONTRAST  MRA HEAD WITHOUT CONTRAST  TECHNIQUE: Multiplanar, multiecho pulse sequences of the brain and surrounding structures were obtained without intravenous contrast. Angiographic images of the head were obtained using MRA technique without contrast.  COMPARISON:  CT 11/24/2013  FINDINGS: MRI HEAD FINDINGS  Ventricle size is normal. Cerebral volume is normal. Pituitary normal in size. Negative for acute infarct. Small white matter hyperintensities bilaterally. Hyperintensity in the pons bilaterally. These findings are most consistent with chronic microvascular ischemia in this patient with known atherosclerotic disease. Negative for intracranial hemorrhage. Negative for mass or edema. Paranasal sinuses are clear.  MRA HEAD FINDINGS  Both vertebral arteries are widely patent. PICA patent bilaterally. Basilar widely patent. AICA, superior cerebellar, posterior cerebral arteries widely patent without stenosis.  Internal carotid artery widely patent bilaterally. Anterior and middle cerebral arteries widely patent without stenosis  Negative for cerebral aneurysm.  IMPRESSION: Negative for acute infarct. Chronic microvascular ischemic change in the white matter and pons.  Negative MRA head   Electronically Signed   By: Marlan Palauharles  Clark M.D.   On: 11/25/2013 09:45    PHYSICAL EXAM Pleasant middle-aged Caucasian lady not in distress.Awake alert. Afebrile. Head is nontraumatic. Neck is supple without bruit. Hearing is normal. Cardiac exam no murmur or gallop. Lungs are clear to auscultation. Distal pulses are well felt. Neurological Exam : Awake alert oriented x 3 normal speech and  language. Mild right lower face asymmetry on smiling. Tongue midline. No drift. Mild diminished fine finger movements on  right. Orbits left over right upper extremity. Mild right grip weak.. Normal sensation . Normal coordination. ASSESSMENT/PLAN [name] is a 48 y.o. female with history of history of stroke in 2008, COPD, depression and anxiety, PFO, fibromyalgia and attention deficit disorder, presenting with new onset right facial droop and slurred speech. She did not receive IV t-PA due to minimal deficits and suspected functional overlay.   Left brain TIA  Resultant  Right facial droop and dysarthria resolved  MRI  No acute stroke  MRA  Unremarkable   Carotid Doppler  1-39 % bilateral stenosis  2D Echo  Left ventricle: The cavity size was normal. Wall thickness was normal. Systolic function was normal. The estimated ejection fraction was in the range of 60% to 65%. Wall motion was normal; there were no regional wall motion abnormalities. 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 I have made any additions or clarifications directly to the above note. Agree with note above. Suspect left brain TIA/small subcortical infarct not seen on MRI -etiology small vessel disease likelyStroke workup is completed.Dc home. F/u as outpatient in 1 month Delia Heady, MD Medical Director [facility] Pager: [phone] 11/26/2013 11:46 PM   To contact Stroke Continuity provider, please refer to [email or web address]. After hours, contact General Neurology

## 2013-11-26 NOTE — Discharge Summary (Signed)
Charge nurse reviewed discharge instructions with patient. Patient denies questions. AVS given to patient. IV and telemetry discontinued. Patient taken via wheelchair to main entrance where her mother is waiting to pick her up.

## 2013-11-26 NOTE — Discharge Instructions (Signed)
Transient Ischemic Attack  A transient ischemic attack (TIA) is a "warning stroke" that causes stroke-like symptoms. Unlike a stroke, a TIA does not cause permanent damage to the brain. The symptoms of a TIA can happen very fast and do not last long. It is important to know the symptoms of a TIA and what to do. This can help prevent a major stroke or death.  CAUSES   · A TIA is caused by a temporary blockage in an artery in the brain or neck (carotid artery). The blockage does not allow the brain to get the blood supply it needs and can cause different symptoms. The blockage can be caused by either:  ¨ A blood clot.  ¨ Fatty buildup (plaque) in a neck or brain artery.  RISK FACTORS  · High blood pressure (hypertension).  · High cholesterol.  · Diabetes mellitus.  · Heart disease.  · The build up of plaque in the blood vessels (peripheral artery disease or atherosclerosis).  · The build up of plaque in the blood vessels providing blood and oxygen to the brain (carotid artery stenosis).  · An abnormal heart rhythm (atrial fibrillation).  · Obesity.  · Smoking.  · Taking oral contraceptives (especially in combination with smoking).  · Physical inactivity.  · A diet high in fats, salt (sodium), and calories.  · Alcohol use.  · Use of illegal drugs (especially cocaine and methamphetamine).  · Being female.  · Being African American.  · Being over the age of 55.  · Family history of stroke.  · Previous history of blood clots, stroke, TIA, or heart attack.  · Sickle cell disease.  SYMPTOMS   TIA symptoms are the same as a stroke but are temporary. These symptoms usually develop suddenly, or may be newly present upon awakening from sleep:  · Sudden weakness or numbness of the face, arm, or leg, especially on one side of the body.  · Sudden trouble walking or difficulty moving arms or legs.  · Sudden confusion.  · Sudden personality changes.  · Trouble speaking (aphasia) or understanding.  · Difficulty swallowing.  · Sudden  trouble seeing in one or both eyes.  · Double vision.  · Dizziness.  · Loss of balance or coordination.  · Sudden severe headache with no known cause.  · Trouble reading or writing.  · Loss of bowel or bladder control.  · Loss of consciousness.  DIAGNOSIS   Your caregiver may be able to determine the presence or absence of a TIA based on your symptoms, history, and physical exam. Computed tomography (CT scan) of the brain is usually performed to help identify a TIA. Other tests may be done to diagnose a TIA. These tests may include:  · Electrocardiography.  · Continuous heart monitoring.  · Echocardiography.  · Carotid ultrasonography.  · Magnetic resonance imaging (MRI).  · A scan of the brain circulation.  · Blood tests.  PREVENTION   The risk of a TIA can be decreased by appropriately treating high blood pressure, high cholesterol, diabetes, heart disease, and obesity and by quitting smoking, limiting alcohol, and staying physically active.  TREATMENT   Time is of the essence. Since the symptoms of TIA are the same as a stroke, it is important to seek treatment as soon as possible because you may need a medicine to dissolve the clot (thrombolytic) that cannot be given if too much time has passed. Treatment options vary. Treatment options may include rest, oxygen, intravenous (IV) fluids,   and medicines to thin the blood (anticoagulants). Medicines and diet may be used to address diabetes, high blood pressure, and other risk factors. Measures will be taken to prevent short-term and long-term complications, including infection from breathing foreign material into the lungs (aspiration pneumonia), blood clots in the legs, and falls. Treatment options include procedures to either remove plaque in the carotid arteries or dilate carotid arteries that have narrowed due to plaque. Those procedures are:  · Carotid endarterectomy.  · Carotid angioplasty and stenting.  HOME CARE INSTRUCTIONS   · Take all medicines prescribed  by your caregiver. Follow the directions carefully. Medicines may be used to control risk factors for a stroke. Be sure you understand all your medicine instructions.  · You may be told to take aspirin or the anticoagulant warfarin. Warfarin needs to be taken exactly as instructed.  ¨ Taking too much or too little warfarin is dangerous. Too much warfarin increases the risk of bleeding. Too little warfarin continues to allow the risk for blood clots. While taking warfarin, you will need to have regular blood tests to measure your blood clotting time. A PT blood test measures how long it takes for blood to clot. Your PT is used to calculate another value called an INR. Your PT and INR help your caregiver to adjust your dose of warfarin. The dose can change for many reasons. It is critically important that you take warfarin exactly as prescribed.  ¨ Many foods, especially foods high in vitamin K can interfere with warfarin and affect the PT and INR. Foods high in vitamin K include spinach, kale, broccoli, cabbage, collard and turnip greens, brussels sprouts, peas, cauliflower, seaweed, and parsley as well as beef and pork liver, green tea, and soybean oil. You should eat a consistent amount of foods high in vitamin K. Avoid major changes in your diet, or notify your caregiver before changing your diet. Arrange a visit with a dietitian to answer your questions.  ¨ Many medicines can interfere with warfarin and affect the PT and INR. You must tell your caregiver about any and all medicines you take, this includes all vitamins and supplements. Be especially cautious with aspirin and anti-inflammatory medicines. Do not take or discontinue any prescribed or over-the-counter medicine except on the advice of your caregiver or pharmacist.  ¨ Warfarin can have side effects, such as excessive bruising or bleeding. You will need to hold pressure over cuts for longer than usual. Your caregiver or pharmacist will discuss other  potential side effects.  ¨ Avoid sports or activities that may cause injury or bleeding.  ¨ Be mindful when shaving, flossing your teeth, or handling sharp objects.  ¨ Alcohol can change the body's ability to handle warfarin. It is best to avoid alcoholic drinks or consume only very small amounts while taking warfarin. Notify your caregiver if you change your alcohol intake.  ¨ Notify your dentist or other caregivers before procedures.  · Eat a diet that includes 5 or more servings of fruits and vegetables each day. This may reduce the risk of stroke. Certain diets may be prescribed to address high blood pressure, high cholesterol, diabetes, or obesity.  ¨ A low-sodium, low-saturated fat, low-trans fat, low-cholesterol diet is recommended to manage high blood pressure.  ¨ A low-saturated fat, low-trans fat, low-cholesterol, and high-fiber diet may control cholesterol levels.  ¨ A controlled-carbohydrate, controlled-sugar diet is recommended to manage diabetes.  ¨ A reduced-calorie, low-sodium, low-saturated fat, low-trans fat, low-cholesterol diet is recommended to manage obesity.  ·   Maintain a healthy weight.  · Stay physically active. It is recommended that you get at least 30 minutes of activity on most or all days.  · Do not smoke.  · Limit alcohol use even if you are not taking warfarin. Moderate alcohol use is considered to be:  ¨ No more than 2 drinks each day for men.  ¨ No more than 1 drink each day for nonpregnant women.  · Stop drug abuse.  · Home safety. A safe home environment is important to reduce the risk of falls. Your caregiver may arrange for specialists to evaluate your home. Having grab bars in the bedroom and bathroom is often important. Your caregiver may arrange for equipment to be used at home, such as raised toilets and a seat for the shower.  · Follow all instructions for follow-up with your caregiver. This is very important. This includes any referrals and lab tests. Proper follow up can  prevent a stroke or another TIA from occurring.  SEEK MEDICAL CARE IF:  · You have personality changes.  · You have difficulty swallowing.  · You are seeing double.  · You have dizziness.  · You have a fever.  · You have skin breakdown.  SEEK IMMEDIATE MEDICAL CARE IF:   Any of these symptoms may represent a serious problem that is an emergency. Do not wait to see if the symptoms will go away. Get medical help right away. Call your local emergency services (911 in [place] Do not drive yourself to the hospital.  · You have sudden weakness or numbness of the face, arm, or leg, especially on one side of the body.  · You have sudden trouble walking or difficulty moving arms or legs.  · You have sudden confusion.  · You have trouble speaking (aphasia) or understanding.  · You have sudden trouble seeing in one or both eyes.  · You have a loss of balance or coordination.  · You have a sudden, severe headache with no known cause.  · You have new chest pain or an irregular heartbeat.  · You have a partial or total loss of consciousness.  MAKE SURE YOU:   · Understand these instructions.  · Will watch your condition.  · Will get help right away if you are not doing well or get worse.  Document Released: 09/27/2004 Document Revised: 12/23/2012 Document Reviewed: 03/25/2013  [employer]® Patient Information ©2015 [employer]. This information is not intended to replace advice given to you by your health care provider. Make sure you discuss any questions you have with your health care provider.

## 2013-11-26 NOTE — Discharge Summary (Signed)
Physician Discharge Summary  Diana Benson ZOX:[id number] DOB: 1965/06/29 DOA: 11/24/2013  PCP: Porfirio OarJEFFERY,CHELLE, PA-C  Admit date: 11/24/2013 Discharge date: 11/26/2013  Recommendations for Outpatient Follow-up:   Discharge home with outpatient PCP follow-up in 1 week.  Discharge Diagnoses:  Principal Problem:  TIA (transient ischemic attack)  Active Problems:  Asthma  Anxiety and depression  Stroke, history of  Fibromyalgia  Tobacco use disorder, continuous  Right arm numbness  Facial droop   Discharge Condition: Fair  Diet recommendation: Heart healthy  Filed Weights   11/24/13 2007 11/25/13 0004 11/26/13 0535  Weight: 68.493 kg (151 lb) 66.815 kg (147 lb 4.8 oz) 68.04 kg (150 lb)    History of present illness:  Reason for to admission H&P for details, but in brief, 48 year old female with history of COPD, active tobacco use, history of CVA status post left-sided carotid endarterectomy in 2008, GERD, asthma, ADHD, fibromyalgia, anxiety and depression who presented with acute onset of right facial droop and dysarthria. She also noticed some slurred speech. Patient is not on antiplatelet therapy. Head CT in the ED was unremarkable. Patient admitted for further TIA workup.   Hospital Course:  TIA No residual symptoms (right facial droop and dysarthria) at presentation now resolved. CT head and MRI negative for acute stroke. Started on full dose aspirin. LDL of 62. Appreciate neurology recommendations. 2-D echo with normal EF and no wall motion abnormality and carotid Doppler with nonsignificant stenosis. - A1c normal Stable for d/c home with outpt follow up. Discharge on full dose aspirin.   COPD Stable. Continue Advair and albuterol inhaler. Continue Singulair. Continue albuterol neb when necessary counseled on smoking cessation. Ordered nicotine patch  hx of ADHD Continue Adderall  GERD Continue Nexium    Code Status: Full code Family  Communication: None at bedside Disposition Plan: Home    Consultants:  Neurology  Procedures:  MRI brain  2-D echo  Carotid Doppler  Antibiotics:  None    Filed Weights   11/24/13 2007 11/25/13 0004 11/26/13 0535  Weight: 68.493 kg (151 lb) 66.815 kg (147 lb 4.8 oz) 68.04 kg (150 lb)      Discharge Exam: Filed Vitals:   11/26/13 0535  BP: 106/76  Pulse: 75  Temp: 97.8 F (36.6 C)  Resp: 16    General: Middle aged female in no acute distress,   HEENT: No pallor, moist oral mucosa  Chest: Clear to auscultation bilaterally, no added sounds  CVS: Normal S1 and S2, no murmurs  Abdomen: Soft, nondistended, nontender, bowel sounds present   extremities: Warm, no edema  CNS: Alert and oriented, appears fatigued, no focal deficit  Discharge Instructions You were cared for by a hospitalist during your hospital stay. If you have any questions about your discharge medications or the care you received while you were in the hospital after you are discharged, you can call the unit and asked to speak with the hospitalist on call if the hospitalist that took care of you is not available. Once you are discharged, your primary care physician will handle any further medical issues. Please note that NO REFILLS for any discharge medications will be authorized once you are discharged, as it is imperative that you return to your primary care physician (or establish a relationship with a primary care physician if you do not have one) for your aftercare needs so that they can reassess your need for medications and monitor your lab values.   Current Discharge Medication List    START taking these medications  Details  aspirin EC 325 MG EC tablet Take 1 tablet (325 mg total) by mouth daily. Qty: 30 tablet, Refills: 0      CONTINUE these medications which have NOT CHANGED   Details  ALPRAZolam (XANAX) 1 MG tablet Take 1 tablet (1 mg total) by mouth 3 (three) times daily as  needed for anxiety. May fill 60 days after date on prescription. Qty: 90 tablet, Refills: 0   Associated Diagnoses: Anxiety state, unspecified    amphetamine-dextroamphetamine (ADDERALL XR) 30 MG 24 hr capsule 2 tablets qam and 1 q evening. BRAND MEDICALLY NECESSARY.May fill 60 days after date on prescription. Qty: 90 capsule, Refills: 0    CALCIUM PO Take 2 tablets by mouth daily.    DULoxetine (CYMBALTA) 60 MG capsule TAKE 1 CAPSULE (60 MG TOTAL) BY MOUTH 2 (TWO) TIMES DAILY. Qty: 60 capsule, Refills: 0    esomeprazole (NEXIUM) 40 MG capsule Take 1 capsule (40 mg total) by mouth 2 (two) times daily. Qty: 60 capsule, Refills: 12   Associated Diagnoses: GERD (gastroesophageal reflux disease)    estrogen-methylTESTOSTERone (EST ESTROGENS-METHYLTEST HS) 0.625-1.25 MG per tablet Take 1 tablet daily Qty: 30 tablet, Refills: 5   Associated Diagnoses: Hormone replacement therapy (HRT)    fluticasone (FLONASE) 50 MCG/ACT nasal spray PLACE 2 SPRAYS IN each nostril DAILY Qty: 16 g, Refills: 12   Associated Diagnoses: AR (allergic rhinitis)    Fluticasone-Salmeterol (ADVAIR) 500-50 MCG/DOSE AEPB Inhale 1 puff into the lungs 2 (two) times daily. Qty: 60 each, Refills: 12   Associated Diagnoses: Asthma    montelukast (SINGULAIR) 10 MG tablet Take 1 tablet (10 mg total) by mouth 1 day or 1 dose. Qty: 30 tablet, Refills: 12   Associated Diagnoses: AR (allergic rhinitis)    valACYclovir (VALTREX) 1000 MG tablet Take 1 tablet (1,000 mg total) by mouth daily. Qty: 90 tablet, Refills: 12   Associated Diagnoses: Herpes    zolpidem (AMBIEN) 10 MG tablet Take 1-1.5 tablets (10-15 mg total) by mouth at bedtime as needed. Qty: 30 tablet, Refills: 0   Associated Diagnoses: Insomnia    albuterol (PROAIR HFA) 108 (90 BASE) MCG/ACT inhaler Inhale 2 puffs into the lungs every 4 (four) hours as needed for wheezing or shortness of breath. Qty: 18 each, Refills: 5   Associated Diagnoses: Asthma     albuterol (PROVENTIL) (5 MG/ML) 0.5% nebulizer solution Take 0.5 mLs (2.5 mg total) by nebulization every 6 (six) hours as needed. Qty: 20 mL, Refills: 5   Associated Diagnoses: Asthma       Allergies  Allergen Reactions  . Codeine Nausea And Vomiting  . Flexeril [Cyclobenzaprine] Other (See Comments)    "Makes me feel like I'm coming out of my skin."  . Gabapentin Swelling  . Morphine And Related Nausea And Vomiting  . Venlafaxine Nausea Only   Follow-up Information    Follow up with JEFFERY,CHELLE, PA-C. Schedule an appointment as soon as possible for a visit in 1 week.   Specialty:  Physician Assistant   Contact information:   [address] [place] [address] [phone]        The results of significant diagnostics from this hospitalization (including imaging, microbiology, ancillary and laboratory) are listed below for reference.    Significant Diagnostic Studies: Dg Chest 2 View  11/24/2013   CLINICAL DATA:  RIGHT-sided facial droop beginning at 1845 hr today. History of RIGHT-sided Bell's palsy and strokes.  EXAM: CHEST  2 VIEW  COMPARISON:  Chest radiograph October 31, 2013  FINDINGS: The heart size and mediastinal contours are within normal limits. Mild bronchitic changes. Both lungs are clear. The visualized skeletal structures are unremarkable.  IMPRESSION: Mild chronic bronchitic changes without focal consolidation.   Electronically Signed   By: Awilda Metroourtnay  Bloomer   On: 11/24/2013 21:40   Dg Chest 2 View  11/01/2013   CLINICAL DATA:  Cough.  EXAM: CHEST  2 VIEW  COMPARISON:  11/13/ 2014.  FINDINGS: Mediastinum and hilar structures normal. Mild infiltrate noted in the right mid lobe. The left lung is clear. Heart size normal. Poor vascularity normal. No pleural effusion or pneumothorax. No acute bony abnormality.  IMPRESSION: Mild right middle lobe infiltrate consistent with pneumonia.   Electronically Signed   By: Maisie Fushomas  Register   On: 11/01/2013 09:05   Ct  Head (brain) Wo Contrast  11/24/2013   CLINICAL DATA:  Code stroke, right facial weakness, right facial droop  EXAM: CT HEAD WITHOUT CONTRAST  TECHNIQUE: Contiguous axial images were obtained from the base of the skull through the vertex without intravenous contrast.  COMPARISON:  MRI brain 08/13/2013, CT brain 05/23/2012  FINDINGS: There is no evidence of mass effect, midline shift or extra-axial fluid collections. There is no evidence of a space-occupying lesion or intracranial hemorrhage. There is no evidence of a cortical-based area of acute infarction.  The ventricles and sulci are appropriate for the patient's age. The basal cisterns are patent.  Visualized portions of the orbits are unremarkable. The visualized portions of the paranasal sinuses and mastoid air cells are unremarkable.  The osseous structures are unremarkable.  IMPRESSION: Normal CT of the brain without intravenous contrast.  These results were called by telephone at the time of interpretation on 11/24/2013 at 8:06 pm to Dr. Roseanne RenoSTEWART, who verbally acknowledged these results.   Electronically Signed   By: Elige KoHetal  Patel   On: 11/24/2013 20:07   Mr Brain Wo Contrast  11/25/2013   CLINICAL DATA:  New onset slurred speech.  TIA  EXAM: MRI HEAD WITHOUT CONTRAST  MRA HEAD WITHOUT CONTRAST  TECHNIQUE: Multiplanar, multiecho pulse sequences of the brain and surrounding structures were obtained without intravenous contrast. Angiographic images of the head were obtained using MRA technique without contrast.  COMPARISON:  CT 11/24/2013  FINDINGS: MRI HEAD FINDINGS  Ventricle size is normal. Cerebral volume is normal. Pituitary normal in size. Negative for acute infarct. Small white matter hyperintensities bilaterally. Hyperintensity in the pons bilaterally. These findings are most consistent with chronic microvascular ischemia in this patient with known atherosclerotic disease. Negative for intracranial hemorrhage. Negative for mass or edema. Paranasal  sinuses are clear.  MRA HEAD FINDINGS  Both vertebral arteries are widely patent. PICA patent bilaterally. Basilar widely patent. AICA, superior cerebellar, posterior cerebral arteries widely patent without stenosis.  Internal carotid artery widely patent bilaterally. Anterior and middle cerebral arteries widely patent without stenosis  Negative for cerebral aneurysm.  IMPRESSION: Negative for acute infarct. Chronic microvascular ischemic change in the white matter and pons.  Negative MRA head   Electronically Signed   By: Marlan Palauharles  Clark M.D.   On: 11/25/2013 09:45   Mr Maxine GlennMra Head/brain Wo Cm  11/25/2013   CLINICAL DATA:  New onset slurred speech.  TIA  EXAM: MRI HEAD WITHOUT CONTRAST  MRA HEAD WITHOUT CONTRAST  TECHNIQUE: Multiplanar, multiecho pulse sequences of the brain and surrounding structures were obtained without intravenous contrast. Angiographic images of the head were obtained using MRA technique without contrast.  COMPARISON:  CT 11/24/2013  FINDINGS: MRI HEAD FINDINGS  Ventricle size is normal. Cerebral volume is normal. Pituitary normal in size. Negative for acute infarct. Small white matter hyperintensities bilaterally. Hyperintensity in the pons bilaterally. These findings are most consistent with chronic microvascular ischemia in this patient with known atherosclerotic disease. Negative for intracranial hemorrhage. Negative for mass or edema. Paranasal sinuses are clear.  MRA HEAD FINDINGS  Both vertebral arteries are widely patent. PICA patent bilaterally. Basilar widely patent. AICA, superior cerebellar, posterior cerebral arteries widely patent without stenosis.  Internal carotid artery widely patent bilaterally. Anterior and middle cerebral arteries widely patent without stenosis  Negative for cerebral aneurysm.  IMPRESSION: Negative for acute infarct. Chronic microvascular ischemic change in the white matter and pons.  Negative MRA head   Electronically Signed   By: Marlan Palau M.D.   On:  11/25/2013 09:45    Microbiology: No results found for this or any previous visit (from the past 240 hour(s)).   Labs: Basic Metabolic Panel:  Recent Labs Lab 11/24/13 2030  NA 139  K 3.9  CL 105  CO2 24  GLUCOSE 210*  BUN 7  CREATININE 0.52  CALCIUM 7.8*   Liver Function Tests:  Recent Labs Lab 11/24/13 2030  AST 17  ALT 19  ALKPHOS 58  BILITOT <0.2*  PROT 6.0  ALBUMIN 2.8*   No results for input(s): LIPASE, AMYLASE in the last 168 hours. No results for input(s): AMMONIA in the last 168 hours. CBC:  Recent Labs Lab 11/24/13 2035  WBC 5.5  HGB 11.3*  HCT 35.5*  MCV 90.3  PLT 243   Cardiac Enzymes: No results for input(s): CKTOTAL, CKMB, CKMBINDEX, TROPONINI in the last 168 hours. BNP: BNP (last 3 results) No results for input(s): PROBNP in the last 8760 hours. CBG:  Recent Labs Lab 11/24/13 1942 11/24/13 2004  GLUCAP 59* 153*       Signed:  Nikkolas Coomes  [facility] 11/26/2013, 8:31 AM

## 2013-11-26 NOTE — Plan of Care (Signed)
Problem: Consults Goal: Ischemic Stroke Patient Education See Patient Education Module for education specifics.  Outcome: Completed/Met Date Met:  11/26/13 Goal: Nutrition Consult-if indicated Outcome: Not Applicable Date Met:  60/60/04  Problem: Acute Treatment Outcomes Goal: Other Acute Treatment Outcomes Outcome: Completed/Met Date Met:  11/26/13  Problem: Progression Outcomes Goal: Communication method established Outcome: Completed/Met Date Met:  11/26/13 Goal: Rehab Team goals identified Outcome: Not Applicable Date Met:  59/97/74 No PT/OT/ST necessary Goal: Progressive activity as tolerated Outcome: Completed/Met Date Met:  11/26/13 Steady gait. Independent. Goal: Tolerating diet/TF at goal rate Outcome: Completed/Met Date Met:  11/26/13 Goal: Pain controlled Outcome: Completed/Met Date Met:  11/26/13 Goal: Educational plan initiated Outcome: Completed/Met Date Met:  11/26/13 Goal: Initial discharge plan initiated Outcome: Completed/Met Date Met:  11/26/13 Goal: Other Progression Outcomes Outcome: Completed/Met Date Met:  11/26/13  Problem: Discharge/Transitional Outcomes Goal: Barriers To Progression Addressed/Resolved Outcome: Completed/Met Date Met:  11/26/13 Goal: Educational Plan Complete Outcome: Completed/Met Date Met:  11/26/13 Goal: Hemodynamically stable Outcome: Completed/Met Date Met:  11/26/13 VSS Goal: Independent mobility/functioning independent or with min Independent mobility/functioning independently or with minimal assistance  Outcome: Completed/Met Date Met:  11/26/13 Goal: Tolerating diet/TF at goal rate-PEG if inadequate intake Outcome: Completed/Met Date Met:  11/26/13 Goal: INR monitor plan established Outcome: Not Applicable Date Met:  14/23/95 Aspirin ordered for discharge Goal: Family and patient agree upon discharge plan Outcome: Completed/Met Date Met:  11/26/13 Goal: Family/Caregiver willing and able to support  plan Family/Caregiver willing and able to support plan for self-management after transition home  Outcome: Completed/Met Date Met:  11/26/13 Goal: PCP appointment made and transportation plan in place Outcome: Completed/Met Date Met:  11/26/13 Goal: Ability to attain medications upon leaving hospital Outcome: Completed/Met Date Met:  11/26/13 Goal: Other Discharge Outcomes/Goals Outcome: Completed/Met Date Met:  11/26/13

## 2013-11-27 DIAGNOSIS — R531 Weakness: Secondary | ICD-10-CM | POA: Insufficient documentation

## 2013-12-05 ENCOUNTER — Other Ambulatory Visit: Payer: Self-pay | Admitting: Physician Assistant

## 2013-12-05 DIAGNOSIS — G4700 Insomnia, unspecified: Secondary | ICD-10-CM

## 2013-12-07 ENCOUNTER — Encounter: Payer: Self-pay | Admitting: Family Medicine

## 2013-12-07 MED ORDER — ZOLPIDEM TARTRATE 10 MG PO TABS: 10.0000 mg | ORAL_TABLET | Freq: Every evening | ORAL | Status: DC | PRN

## 2013-12-07 NOTE — Telephone Encounter (Signed)
Pt called to check to see if the Rx req that she sent through MyChart got to Chelle. I see that it is in the system w/recipient, Chelle, but do not see it in her in-basket. When I started this message, the MyChart req did attach so I'm not sure why it didn't make it to Chelle? Pended for review. Chelle, did you want # to be 30, or 45?Since sig is 1-1.5 Qhs wanted to check.

## 2013-12-07 NOTE — Telephone Encounter (Signed)
Faxed. Notified pt  

## 2013-12-14 ENCOUNTER — Other Ambulatory Visit: Payer: Self-pay | Admitting: Physician Assistant

## 2013-12-16 NOTE — Telephone Encounter (Signed)
Faxed

## 2014-01-07 ENCOUNTER — Other Ambulatory Visit: Payer: Self-pay | Admitting: Physician Assistant

## 2014-01-07 ENCOUNTER — Other Ambulatory Visit: Payer: Self-pay | Admitting: Internal Medicine

## 2014-01-08 NOTE — Telephone Encounter (Signed)
Faxed

## 2014-01-11 ENCOUNTER — Other Ambulatory Visit: Payer: Self-pay

## 2014-01-11 MED ORDER — AMPHETAMINE-DEXTROAMPHET ER 30 MG PO CP24: ORAL_CAPSULE | ORAL | Status: DC

## 2014-01-11 NOTE — Telephone Encounter (Signed)
Notified pt Adderall is ready and xanax should be at pharm.

## 2014-01-11 NOTE — Telephone Encounter (Signed)
Pt LM on my VM asking for RFs of xanax and adderall. I faxed in the Rx for xanax on 01/08/14 and will notify pt of this. Chelle, can you RF the adderall? Pt reported that she is currently seeking new job and it is very important that she be able to focus.

## 2014-01-31 ENCOUNTER — Encounter: Payer: Self-pay | Admitting: Family Medicine

## 2014-02-01 ENCOUNTER — Ambulatory Visit: Payer: Self-pay | Admitting: Neurology

## 2014-02-04 ENCOUNTER — Encounter: Payer: Self-pay | Admitting: Neurology

## 2014-02-05 ENCOUNTER — Ambulatory Visit: Payer: BC Managed Care – PPO | Admitting: Neurology

## 2014-02-05 ENCOUNTER — Other Ambulatory Visit: Payer: Self-pay

## 2014-02-05 MED ORDER — AMPHETAMINE-DEXTROAMPHET ER 30 MG PO CP24: ORAL_CAPSULE | ORAL | Status: DC

## 2014-02-05 MED ORDER — ZOLPIDEM TARTRATE 10 MG PO TABS: ORAL_TABLET | ORAL | Status: DC

## 2014-02-05 NOTE — Telephone Encounter (Signed)
Pt called and reqs RFs of Ambien and Adderall written so that she can p/up tomorrow. Asked if I could make sure that Chelle gets the message today. She will run out of Ambien after tonight. Pended for review. Chelle, just wanted to check to make sure # is as you want it for Ambien, it is for #30 but sig is to take 1 to 1 1/2 Qhs.

## 2014-02-06 ENCOUNTER — Telehealth: Payer: Self-pay | Admitting: Physician Assistant

## 2014-02-07 NOTE — Telephone Encounter (Signed)
Pt called again requesting a refill of ambien! She was upset because she said she spoke to [place] on Friday and thought it would be ready by now.

## 2014-02-08 NOTE — Telephone Encounter (Signed)
Spoke with pt, she has her Rx for Ambien.

## 2014-02-08 NOTE — Telephone Encounter (Signed)
Notified pt on VM Rxs are ready. 

## 2014-02-21 ENCOUNTER — Encounter: Payer: Self-pay | Admitting: Family Medicine

## 2014-02-26 ENCOUNTER — Telehealth: Payer: Self-pay

## 2014-02-26 NOTE — Telephone Encounter (Signed)
Disregard this message, Gloris Manchesterraci called back and she has this script from her pharmacy.

## 2014-02-26 NOTE — Telephone Encounter (Signed)
Pt would like a refill on her ALPRAZolam (XANAX) 1 MG tablet [[id number]][[id number]] prescription. The pharmacy is correct. Please advise at [phone]

## 2014-04-18 ENCOUNTER — Other Ambulatory Visit: Payer: Self-pay | Admitting: Physician Assistant

## 2014-04-19 ENCOUNTER — Other Ambulatory Visit: Payer: Self-pay

## 2014-04-19 MED ORDER — AMPHETAMINE-DEXTROAMPHET ER 30 MG PO CP24: ORAL_CAPSULE | ORAL | Status: DC

## 2014-04-19 NOTE — Telephone Encounter (Signed)
Meds ordered this encounter  Medications  . ALPRAZolam (XANAX) 1 MG tablet    Sig: TAKE 1 TABLET BY MOUTH THREE TIMES DAILY AS NEEDED    Dispense:  90 tablet    Refill:  0    Not to exceed 5 additional fills before 07/06/2014.  . zolpidem (AMBIEN) 10 MG tablet    Sig: TAKE 1- 1.5 TABLET BY MOUTH AT BEDTIME    Dispense:  30 tablet    Refill:  0    Not to exceed 5 additional fills before 08/04/2014.  Marland Kitchen. EST ESTROGENS-METHYLTEST HS 0.625-1.25 MG per tablet    Sig: TAKE 1 TABLET EVERY DAY    Dispense:  30 tablet    Refill:  0    Not to exceed 5 additional fills before 06/14/2014.    Please let the patient know that she needs to come in for evaluation for the next fill of these medications.

## 2014-04-19 NOTE — Telephone Encounter (Signed)
Pt reqs RF of Adderall. Pended.

## 2014-04-20 NOTE — Telephone Encounter (Signed)
Notified pt Rx is ready. 

## 2014-04-20 NOTE — Telephone Encounter (Signed)
Faxed and pt notified of need for f/up. Pt agreed.

## 2014-06-16 ENCOUNTER — Other Ambulatory Visit: Payer: Self-pay | Admitting: Physician Assistant

## 2014-06-18 NOTE — Telephone Encounter (Signed)
Called in.

## 2014-06-22 ENCOUNTER — Other Ambulatory Visit: Payer: Self-pay

## 2014-06-22 DIAGNOSIS — F900 Attention-deficit hyperactivity disorder, predominantly inattentive type: Secondary | ICD-10-CM

## 2014-06-22 DIAGNOSIS — F419 Anxiety disorder, unspecified: Secondary | ICD-10-CM

## 2014-06-22 DIAGNOSIS — F329 Major depressive disorder, single episode, unspecified: Secondary | ICD-10-CM

## 2014-06-22 NOTE — Telephone Encounter (Signed)
Pt called inquring about recent request for medication refills, states she spoke with Chelle about amphetamine-dextroamphetamine (ADDERALL XR) 30 MG 24 hr capsule,zolpidem (AMBIEN) 10 MG tablet, and EST ESTROGENS-METHYLTEST HS 0.625-1.25 MG per tablet. It looks like only the EST ESTROGENS-METHYLTEST HS 0.625-1.25 MG per tablet was refilled. Please advise pt. Placing on high priority due to pt stating request was already placed several days prior

## 2014-06-23 MED ORDER — ZOLPIDEM TARTRATE 10 MG PO TABS: ORAL_TABLET | ORAL | Status: DC

## 2014-06-23 MED ORDER — AMPHETAMINE-DEXTROAMPHET ER 30 MG PO CP24: ORAL_CAPSULE | ORAL | Status: DC

## 2014-06-23 NOTE — Telephone Encounter (Signed)
Rx printed.  Meds ordered this encounter  Medications  . zolpidem (AMBIEN) 10 MG tablet    Sig: TAKE 1- 1.5 TABLET BY MOUTH AT BEDTIME    Dispense:  30 tablet    Refill:  0    Not to exceed 5 additional fills before 08/04/2014.    Order Specific Question:  Supervising Provider    Answer:  DOOLITTLE, ROBERT P [[id number]]  . amphetamine-dextroamphetamine (ADDERALL XR) 30 MG 24 hr capsule    Sig: 2 tablets qam and 1 q evening. BRAND MEDICALLY NECESSARY.    Dispense:  90 capsule    Refill:  0    Order Specific Question:  Supervising Provider    Answer:  DOOLITTLE, ROBERT P [[id number]]

## 2014-06-23 NOTE — Telephone Encounter (Signed)
Ready to pick up. Pt notified on voicemail.

## 2014-06-25 ENCOUNTER — Other Ambulatory Visit: Payer: Self-pay | Admitting: Physician Assistant

## 2014-06-27 NOTE — Telephone Encounter (Signed)
Meds ordered this encounter  Medications  . ALPRAZolam (XANAX) 1 MG tablet    Sig: TAKE 1 TABLET THREE TIMES A DAY AS NEEDED    Dispense:  90 tablet    Refill:  0    Not to exceed 5 additional fills before 10/16/2014.

## 2014-06-28 NOTE — Telephone Encounter (Signed)
Faxed

## 2014-07-19 ENCOUNTER — Other Ambulatory Visit: Payer: Self-pay | Admitting: Physician Assistant

## 2014-07-29 IMAGING — CT CT CHEST W/O CM
2 of 4 series · 15 of 36 positions shown, 18 images · non-contrast
Comparison: 11/13/2012 plain film.  11/03/2012 plain film.

CLINICAL DATA: Possible lingular nodules/ airspace disease. Right
middle lobe lung nodule. Recent pneumonia. Smoker with bilateral
chest pain.

EXAM:
CT CHEST WITHOUT CONTRAST
TECHNIQUE: Multidetector CT imaging of the chest was performed following the
standard protocol without IV contrast.

[Series 2: chest w/o · axial · non-contrast · 0.59mm/px · z∈[-361,-111]mm · 12 of 60 slices shown, 15 images]
[im 5/60  mediastinal]
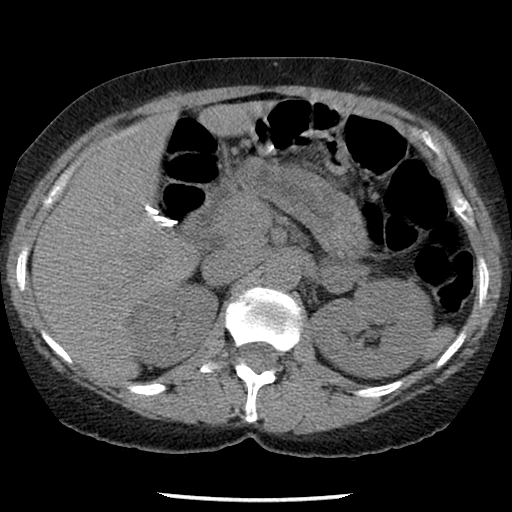
[im 5/60  lung]
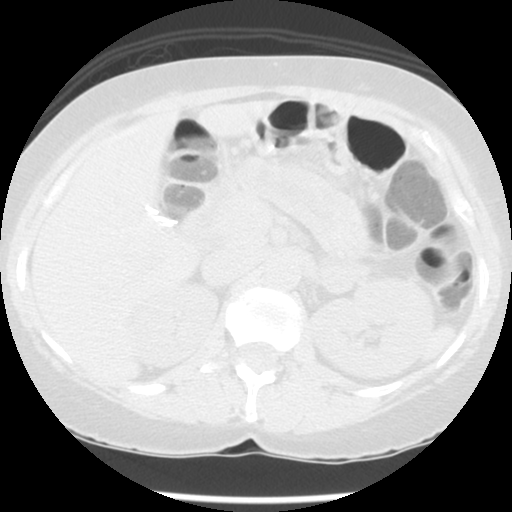
[im 10/60  lung]
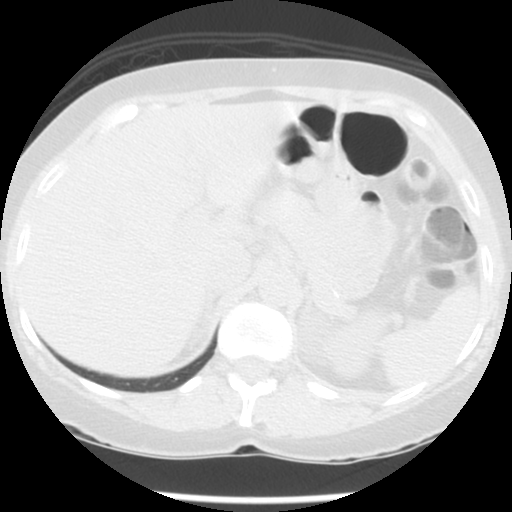
[im 14/60  lung]
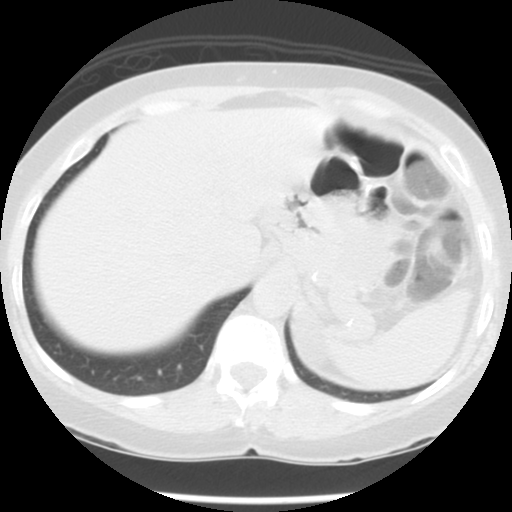
[im 19/60  lung]
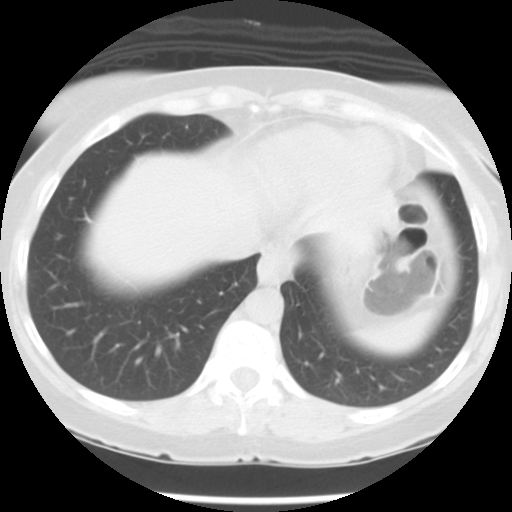
[im 23/60  mediastinal]
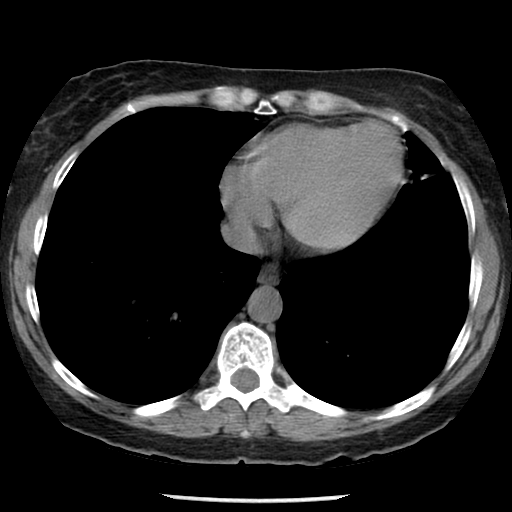
[im 23/60  lung]
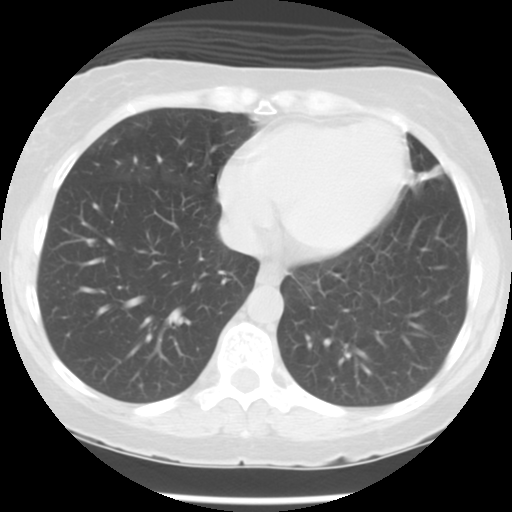
[im 28/60  lung]
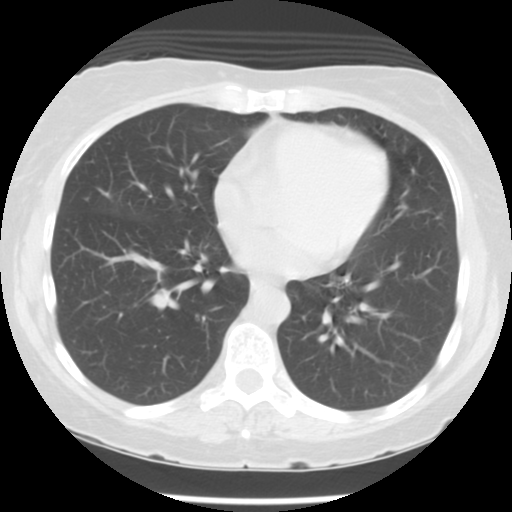
[im 32/60  lung]
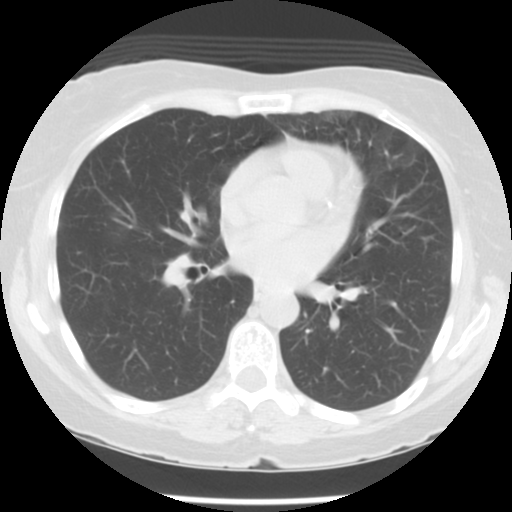
[im 37/60  lung]
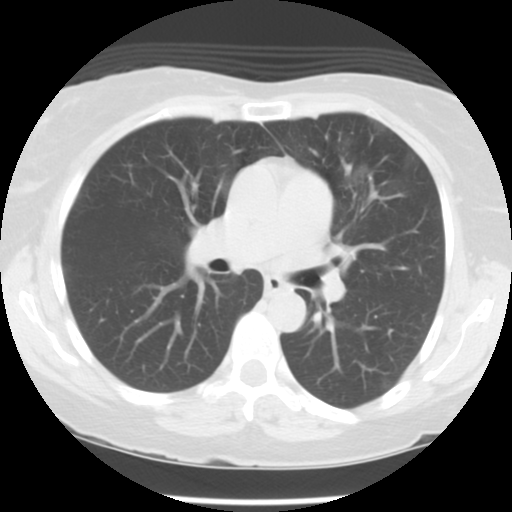
[im 41/60  mediastinal]
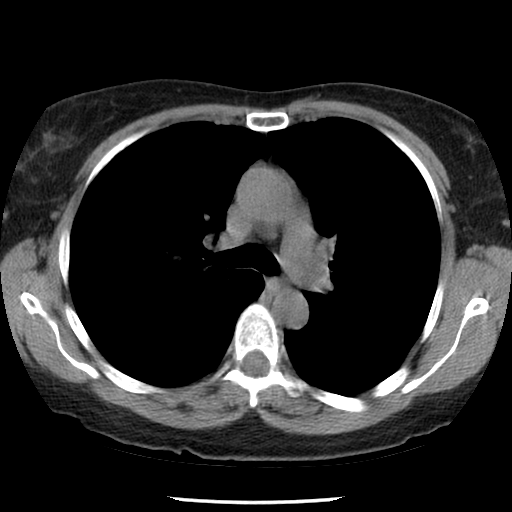
[im 41/60  lung]
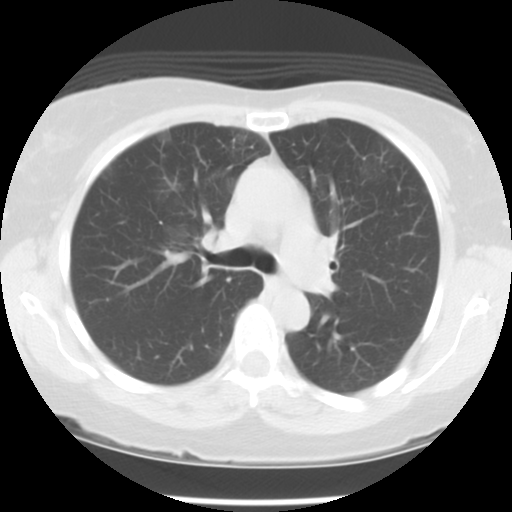
[im 46/60  lung]
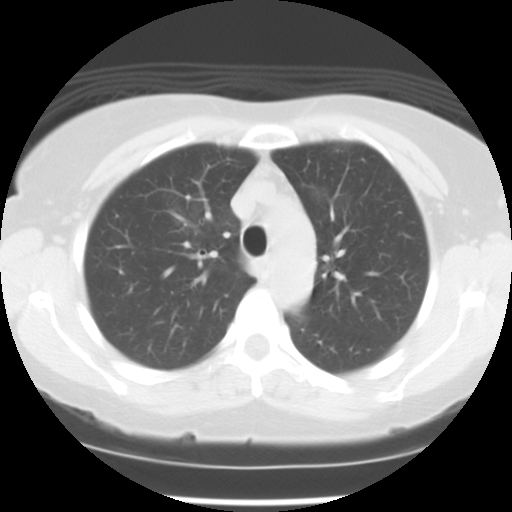
[im 50/60  lung]
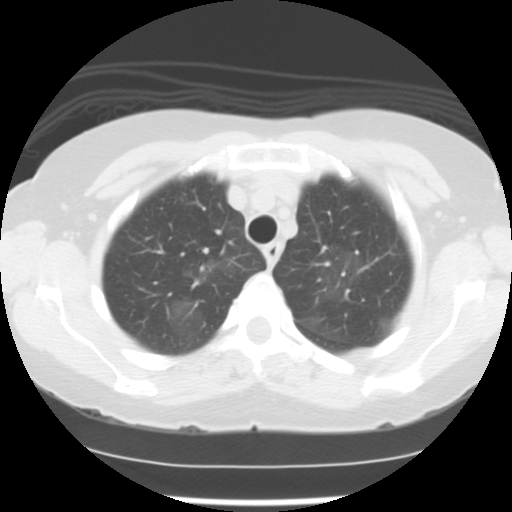
[im 55/60  lung]
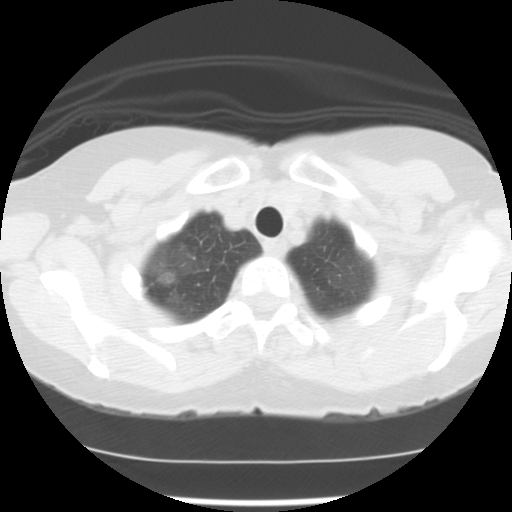

[Series 400: cor · coronal · 0.60mm/px · 3 of 107 slices shown]
[im 22/107  lung]
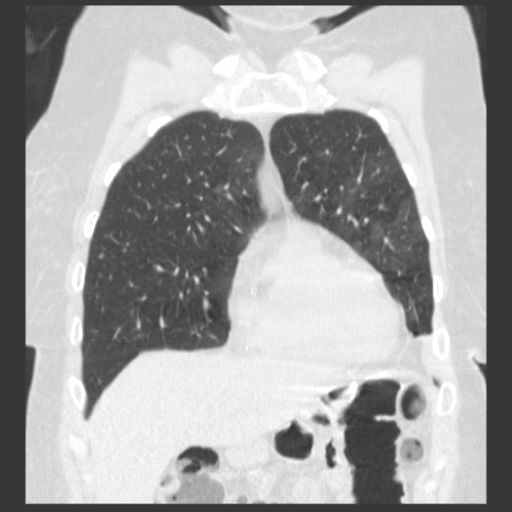
[im 43/107  lung]
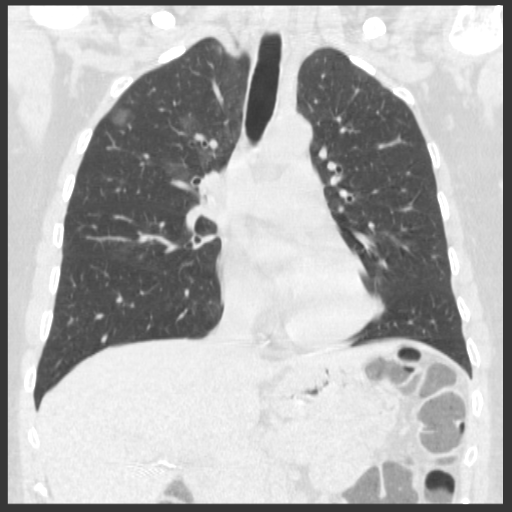
[im 64/107  lung]
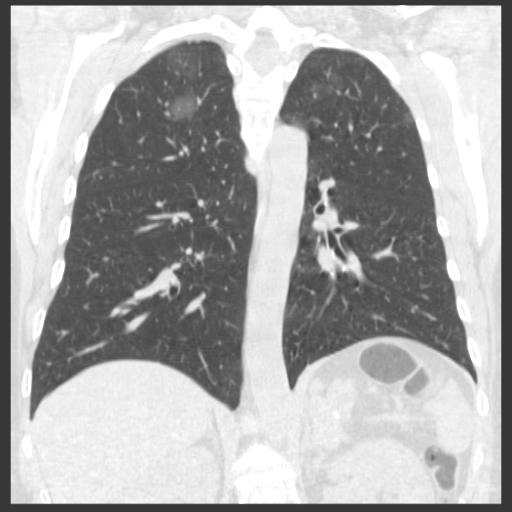

[15 of 36 positions shown; findings below may reference images not displayed]

FINDINGS: Lungs/Pleura: Similar 3 mm right middle lobe lung nodule on image
31/ series 3. This is consistent with a benign etiology.

Mild lingular volume loss.

There are slightly upper lobe predominant scattered ground-glass
opacities. These are bilateral. Index right upper lobe lesion
measures 1.4 cm on image 14.

No pleural fluid.

Heart/Mediastinum: Normal heart size without pericardial or pleural
effusion. Coronary artery disease, including within the LAD and
right coronary artery. No mediastinal or definite hilar adenopathy,
given limitations of unenhanced CT. A small hiatal hernia.

Upper Abdomen: Cholecystectomy. Surgical changes about the proximal
stomach, likely of Roux-en-Y gastric bypass. Left adrenal 4.1 cm
lesion which is similar to the prior exam and measures -13 HU.

Bones/Musculoskeletal:  No acute osseous abnormality.
IMPRESSION: 1. No evidence of suspicious pulmonary nodule or mass.
2. Diffuse but slightly upper lobe predominant ground-glass
opacities. Given the clinical history, likely related to residual
infection, including atypical etiologies. However, neoplastic
processes cannot be entirely excluded. Per consensus criteria,
followup with chest CT at 3 months is recommended to confirm
persistence versus resolution. This recommendation follows the
consensus statement: Recommendations for the Management of Subsolid
Pulmonary Nodules Detected at CT: A Statement from the [employer]
3. Coronary artery disease, markedly age advanced. Recommend
assessment of coronary risk factors and consideration of medical
therapy.
4. 4.1 cm left adrenal myelolipoma.

## 2014-09-15 ENCOUNTER — Encounter (HOSPITAL_COMMUNITY): Payer: Self-pay | Admitting: Neurology

## 2014-09-15 ENCOUNTER — Emergency Department (HOSPITAL_COMMUNITY)
Admission: EM | Admit: 2014-09-15 | Discharge: 2014-09-15 | Disposition: A | Discharge status: Home / Self Care | Payer: Self-pay | Attending: Emergency Medicine | Admitting: Emergency Medicine

## 2014-09-15 ENCOUNTER — Emergency Department (HOSPITAL_COMMUNITY): Payer: Self-pay

## 2014-09-15 DIAGNOSIS — Z8673 Personal history of transient ischemic attack (TIA), and cerebral infarction without residual deficits: Secondary | ICD-10-CM | POA: Insufficient documentation

## 2014-09-15 DIAGNOSIS — W228XXA Striking against or struck by other objects, initial encounter: Secondary | ICD-10-CM | POA: Insufficient documentation

## 2014-09-15 DIAGNOSIS — Z7951 Long term (current) use of inhaled steroids: Secondary | ICD-10-CM | POA: Insufficient documentation

## 2014-09-15 DIAGNOSIS — S92512A Displaced fracture of proximal phalanx of left lesser toe(s), initial encounter for closed fracture: Secondary | ICD-10-CM | POA: Insufficient documentation

## 2014-09-15 DIAGNOSIS — F419 Anxiety disorder, unspecified: Secondary | ICD-10-CM | POA: Insufficient documentation

## 2014-09-15 DIAGNOSIS — Z87891 Personal history of nicotine dependence: Secondary | ICD-10-CM | POA: Insufficient documentation

## 2014-09-15 DIAGNOSIS — Y9289 Other specified places as the place of occurrence of the external cause: Secondary | ICD-10-CM | POA: Insufficient documentation

## 2014-09-15 DIAGNOSIS — K219 Gastro-esophageal reflux disease without esophagitis: Secondary | ICD-10-CM | POA: Insufficient documentation

## 2014-09-15 DIAGNOSIS — Z79899 Other long term (current) drug therapy: Secondary | ICD-10-CM | POA: Insufficient documentation

## 2014-09-15 DIAGNOSIS — Z7982 Long term (current) use of aspirin: Secondary | ICD-10-CM | POA: Insufficient documentation

## 2014-09-15 DIAGNOSIS — S92911A Unspecified fracture of right toe(s), initial encounter for closed fracture: Secondary | ICD-10-CM

## 2014-09-15 DIAGNOSIS — J449 Chronic obstructive pulmonary disease, unspecified: Secondary | ICD-10-CM | POA: Insufficient documentation

## 2014-09-15 DIAGNOSIS — Y9389 Activity, other specified: Secondary | ICD-10-CM | POA: Insufficient documentation

## 2014-09-15 DIAGNOSIS — Y998 Other external cause status: Secondary | ICD-10-CM | POA: Insufficient documentation

## 2014-09-15 DIAGNOSIS — F329 Major depressive disorder, single episode, unspecified: Secondary | ICD-10-CM | POA: Insufficient documentation

## 2014-09-15 MED ORDER — ONDANSETRON 4 MG PO TBDP
4.0000 mg | ORAL_TABLET | Freq: Once | ORAL | Status: AC
  Administered 2014-09-15: 4 mg via ORAL
  Filled 2014-09-15: qty 1

## 2014-09-15 MED ORDER — ONDANSETRON HCL 4 MG PO TABS: 4.0000 mg | ORAL_TABLET | Freq: Four times a day (QID) | ORAL | Status: DC

## 2014-09-15 MED ORDER — HYDROCODONE-ACETAMINOPHEN 5-325 MG PO TABS
1.0000 | ORAL_TABLET | Freq: Once | ORAL | Status: AC
  Administered 2014-09-15: 1 via ORAL
  Filled 2014-09-15: qty 1

## 2014-09-15 MED ORDER — IBUPROFEN 600 MG PO TABS: 600.0000 mg | ORAL_TABLET | Freq: Four times a day (QID) | ORAL | Status: DC | PRN

## 2014-09-15 MED ORDER — HYDROCODONE-ACETAMINOPHEN 5-325 MG PO TABS: 1.0000 | ORAL_TABLET | ORAL | Status: DC | PRN

## 2014-09-15 NOTE — ED Notes (Addendum)
Pt reports yesterday got her left side toes hung on the coffee table and c/o pain to top of left foot.

## 2014-09-15 NOTE — ED Provider Notes (Signed)
CSN: [id number]     Arrival date & time 09/15/14  0865 History  This chart was scribed for non-physician practitioner working with Jerelyn Scott, MD, by Jarvis Morgan, ED Scribe. This patient was seen in room TR09C/TR09C and the patient's care was started at 11:23 AM.     Chief Complaint  Patient presents with  . Foot Pain   The history is provided by the patient. No language interpreter was used.    HPI Comments: Diana Benson is a 49 y.o. female who presents to the Emergency Department complaining of constant, moderate, left 5th toe s/p toe injury onset yesterday. Pt states she was walking and her left 5th toe got caught on the the leg of the coffee table. She reports associated mild swelling and bruising to the area. She has been alternating Motrin and Tylenol with mild relief. She also applied ice with mild relief. She endorses that applied pressure and ambulation exacerbates the pain. She denies any other injuries. She denies any wound, weakness, numbness or sensation loss.   Past Medical History  Diagnosis Date  . COPD (chronic obstructive pulmonary disease) with chronic bronchitis   . CVA (cerebral vascular accident) December 2008    Status post left-sided CAE  . GERD (gastroesophageal reflux disease)   . Asthma   . ADHD (attention deficit hyperactivity disorder)   . Anxiety   . Depression   . Fibromyalgia   . Tobacco use disorder, continuous 01/09/2013  . PFO (patent foramen ovale) 01/09/2013  . Coronary artery calcification seen on CAT scan 01/09/2013   Past Surgical History  Procedure Laterality Date  . Appendectomy    . Abdominal hysterectomy    . 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 Tonsillectomy    . Cosmetic surgery      Post GOP  . Carotid endarterectomy Left December 2008    [facility]   Family History  Problem Relation Age of Onset  . Osteoporosis Mother   . Hypertension Mother   . Stroke Father   . Drug abuse Daughter   .  Alzheimer's disease Paternal Grandmother   . Transient ischemic attack Paternal Grandmother   . Acute myelogenous leukemia Paternal Grandfather   . Heart Problems Brother     ?murmur   Social History  Substance Use Topics  . Smoking status: Former Smoker -- 1.00 packs/day for 17 years    Types: Cigarettes    Quit date: 06/09/2010  . Smokeless tobacco: Never Used     Comment: 1 pack of cigarettes/week (01/09/2013)  . Alcohol Use: Yes     Comment: rare   OB History    No data available     Review of Systems  Musculoskeletal: Positive for joint swelling and arthralgias.  Skin: Positive for color change. Negative for wound.  Neurological: Negative for weakness and numbness.  All other systems reviewed and are negative.     Allergies  Codeine; Flexeril; Gabapentin; Morphine and related; and Venlafaxine  Home Medications   Prior to Admission medications   Medication Sig Start Date End Date Taking? Authorizing Provider  albuterol (PROAIR HFA) 108 (90 BASE) MCG/ACT inhaler Inhale 2 puffs into the lungs every 4 (four) hours as needed for wheezing or shortness of breath. 06/16/13   Chelle Jeffery, PA-C  albuterol (PROVENTIL) (5 MG/ML) 0.5% nebulizer solution Take 0.5 mLs (2.5 mg total) by nebulization every 6 (six) hours as needed. 09/18/12   Chelle Jeffery, PA-C  ALPRAZolam Prudy Feeler) 1 MG tablet  TAKE 1 TABLET THREE TIMES A DAY AS NEEDED 06/27/14   Chelle Jeffery, PA-C  amphetamine-dextroamphetamine (ADDERALL XR) 30 MG 24 hr capsule 2 tablets qam and 1 q evening. BRAND MEDICALLY NECESSARY. 06/23/14   Chelle Jeffery, PA-C  aspirin EC 325 MG EC tablet Take 1 tablet (325 mg total) by mouth daily. 11/26/13   Nishant Dhungel, MD  CALCIUM PO Take 2 tablets by mouth daily.    Historical Provider, MD  DULoxetine (CYMBALTA) 60 MG capsule TAKE 1 CAPSULE (60 MG TOTAL) BY MOUTH 2 (TWO) TIMES DAILY. 10/08/13   Chelle Jeffery, PA-C  DULoxetine (CYMBALTA) 60 MG capsule Take 1 capsule (60 mg total) by mouth  2 (two) times daily. PATIENT NEEDS OFFICE VISIT FOR ADDITIONAL REFILLS 07/20/14   Porfirio Oar, PA-C  esomeprazole (NEXIUM) 40 MG capsule Take 1 capsule (40 mg total) by mouth 2 (two) times daily. 06/16/13   Chelle Jeffery, PA-C  estrogen-methylTESTOSTERone (EST ESTROGENS-METHYLTEST HS) 0.625-1.25 MG per tablet Take 1 tablet by mouth daily. PATIENT NEEDS OFFICE VISIT FOR ADDITIONAL REFILLS 06/17/14   Porfirio Oar, PA-C  fluticasone [facility]) 50 MCG/ACT nasal spray PLACE 2 SPRAYS IN each nostril DAILY 06/16/13   Chelle Jeffery, PA-C  Fluticasone-Salmeterol (ADVAIR) 500-50 MCG/DOSE AEPB Inhale 1 puff into the lungs 2 (two) times daily. 06/16/13   Chelle Jeffery, PA-C  guaiFENesin-dextromethorphan (ROBITUSSIN DM) 100-10 MG/5ML syrup Take 5 mLs by mouth every 4 (four) hours as needed for cough. 11/26/13   Nishant Dhungel, MD  HYDROcodone-acetaminophen (NORCO/VICODIN) 5-325 MG per tablet Take 1-2 tablets by mouth every 4 (four) hours as needed. 09/15/14   Aliana Kreischer Neva Seat, PA-C  ibuprofen (ADVIL,MOTRIN) 600 MG tablet Take 1 tablet (600 mg total) by mouth every 6 (six) hours as needed. 09/15/14   Abayomi Pattison Neva Seat, PA-C  montelukast (SINGULAIR) 10 MG tablet Take 1 tablet (10 mg total) by mouth at bedtime. PATIENT NEEDS OFFICE VISIT FOR ADDITIONAL REFILLS 07/20/14   Porfirio Oar, PA-C  nicotine (NICODERM CQ) 7 mg/24hr patch Place 1 patch (7 mg total) onto the skin daily. 11/26/13   Nishant Dhungel, MD  ondansetron (ZOFRAN) 4 MG tablet Take 1 tablet (4 mg total) by mouth every 6 (six) hours. 09/15/14   Myrle Dues Neva Seat, PA-C  valACYclovir (VALTREX) 1000 MG tablet Take 1 tablet (1,000 mg total) by mouth daily. 06/16/13   Chelle Jeffery, PA-C  zolpidem (AMBIEN) 10 MG tablet TAKE 1- 1.5 TABLET BY MOUTH AT BEDTIME 06/23/14   Chelle Jeffery, PA-C   Triage Vitals: BP 124/71 mmHg  Pulse 90  Temp(Src) 98.4 F (36.9 C) (Oral)  Resp 16  SpO2 96%  Physical Exam  Constitutional: She is oriented to person, place, and time. She  appears well-developed and well-nourished. No distress.  HENT:  Head: Normocephalic and atraumatic.  Eyes: Conjunctivae and EOM are normal.  Neck: Neck supple. No tracheal deviation present.  Cardiovascular: Normal rate.   Pulmonary/Chest: Effort normal. No respiratory distress.  Musculoskeletal: Normal range of motion.  minimal swelling, significant ecchymosis and tenderness to base of 5th toe of left foot Decreased ROM due to pain. CR < 2 seconds  Neurological: She is alert and oriented to person, place, and time.  Skin: Skin is warm and dry.  Psychiatric: She has a normal mood and affect. Her behavior is normal.  Nursing note and vitals reviewed.   ED Course  Procedures (including critical care time)  DIAGNOSTIC STUDIES: Oxygen Saturation is 96% on RA, normal by my interpretation.    COORDINATION OF CARE:  10:12 AM- Will order  imaging of left foot.  Pt advised of plan for treatment and pt agrees.   IMPRESSION: Fracture through the distal aspect of the left fifth proximal phalanx.   11:20 AM- Will prescribe a short course of Vicodin for pain, Zofran and Motrin and order post op boot/crutches, pt referred to Ortho. Pt advised of plan for treatment and pt agrees.  Labs Review Labs Reviewed - No data to display  Imaging Review Dg Foot Complete Left  09/15/2014   CLINICAL DATA:  Left foot injury. CT left toes lung in a coffee table. Pain along top of left foot.  EXAM: LEFT FOOT - COMPLETE 3+ VIEW  COMPARISON:  None.  FINDINGS: There is an oblique fracture through the distal aspect of the left fifth proximal phalanx, entering the IP joint. No additional fracture. Soft tissues are intact. No subluxation or dislocation.  IMPRESSION: Fracture through the distal aspect of the left fifth proximal phalanx.   Electronically Signed   By: Charlett Nose M.D.   On: 09/15/2014 10:40   I have personally reviewed and evaluated these images and lab results as part of my medical  decision-making.   EKG Interpretation None      MDM   Final diagnoses:  Toe fracture, right, closed, initial encounter    Medications  HYDROcodone-acetaminophen (NORCO/VICODIN) 5-325 MG per tablet 1 tablet (not administered)  ondansetron (ZOFRAN-ODT) disintegrating tablet 4 mg (not administered)    49 y.o.Diana Benson's evaluation in the Emergency Department is complete. It has been determined that no acute conditions requiring further emergency intervention are present at this time. The patient/guardian have been advised of the diagnosis and plan. We have discussed signs and symptoms that warrant return to the ED, such as changes or worsening in symptoms.  Vital signs are stable at discharge. Filed Vitals:   09/15/14 1011  BP: 124/71  Pulse: 90  Temp: 98.4 F (36.9 C)  Resp: 16    Patient/guardian has voiced understanding and agreed to follow-up with the PCP or specialist.  I personally performed the services described in this documentation, which was scribed in my presence. The recorded information has been reviewed and is accurate.     Marlon Pel, PA-C 09/15/14 1125  Jerelyn Scott, MD 09/15/14 1125

## 2014-09-15 NOTE — Discharge Instructions (Signed)

## 2014-09-23 ENCOUNTER — Ambulatory Visit: Payer: Self-pay

## 2014-10-05 ENCOUNTER — Other Ambulatory Visit: Payer: Self-pay | Admitting: Physician Assistant

## 2014-10-08 ENCOUNTER — Telehealth: Payer: Self-pay | Admitting: Physician Assistant

## 2014-10-08 NOTE — Telephone Encounter (Signed)
Relayed to PT that OV is required// Provided [facility] Financial Assistance Program///

## 2014-10-08 NOTE — Telephone Encounter (Signed)
I have not seen her in more than a year, so she needs to come in to discuss this.  In addition, while I understand her frustration for weight gain, that is a side effect of the medication, and NOT a reason to take it.  It is indicated for treatment of inattention and difficulty with focus.

## 2014-10-08 NOTE — Telephone Encounter (Signed)
Patient states that she needs a refill on Adderall. She was very tearful when explaining that she has been without this medication for 2 months and she has gained 25lbs. Patient states that she does not have health insurance and does not have the money to come in for an OV. Patient's last OV was 11/24/2013.   [phone]

## 2014-10-08 NOTE — Telephone Encounter (Signed)
Unfortunately she may still need an office visit. Please advise.

## 2014-10-11 ENCOUNTER — Ambulatory Visit: Payer: Self-pay | Attending: Family Medicine

## 2014-10-25 ENCOUNTER — Observation Stay (HOSPITAL_COMMUNITY)
Admission: EM | Admit: 2014-10-25 | Discharge: 2014-10-27 | Disposition: A | Discharge status: Home Health | Payer: Self-pay | Attending: Internal Medicine | Admitting: Internal Medicine

## 2014-10-25 ENCOUNTER — Observation Stay (HOSPITAL_COMMUNITY): Payer: Self-pay

## 2014-10-25 ENCOUNTER — Emergency Department (HOSPITAL_COMMUNITY): Payer: MEDICAID

## 2014-10-25 ENCOUNTER — Emergency Department (HOSPITAL_COMMUNITY): Payer: Self-pay

## 2014-10-25 ENCOUNTER — Encounter (HOSPITAL_COMMUNITY): Payer: Self-pay

## 2014-10-25 DIAGNOSIS — Z23 Encounter for immunization: Secondary | ICD-10-CM | POA: Insufficient documentation

## 2014-10-25 DIAGNOSIS — Z8673 Personal history of transient ischemic attack (TIA), and cerebral infarction without residual deficits: Secondary | ICD-10-CM | POA: Insufficient documentation

## 2014-10-25 DIAGNOSIS — K219 Gastro-esophageal reflux disease without esophagitis: Secondary | ICD-10-CM | POA: Insufficient documentation

## 2014-10-25 DIAGNOSIS — I639 Cerebral infarction, unspecified: Secondary | ICD-10-CM

## 2014-10-25 DIAGNOSIS — F909 Attention-deficit hyperactivity disorder, unspecified type: Secondary | ICD-10-CM | POA: Insufficient documentation

## 2014-10-25 DIAGNOSIS — I2584 Coronary atherosclerosis due to calcified coronary lesion: Secondary | ICD-10-CM | POA: Insufficient documentation

## 2014-10-25 DIAGNOSIS — G459 Transient cerebral ischemic attack, unspecified: Principal | ICD-10-CM | POA: Diagnosis present

## 2014-10-25 DIAGNOSIS — F17209 Nicotine dependence, unspecified, with unspecified nicotine-induced disorders: Secondary | ICD-10-CM | POA: Diagnosis present

## 2014-10-25 DIAGNOSIS — Z79891 Long term (current) use of opiate analgesic: Secondary | ICD-10-CM | POA: Insufficient documentation

## 2014-10-25 DIAGNOSIS — F900 Attention-deficit hyperactivity disorder, predominantly inattentive type: Secondary | ICD-10-CM

## 2014-10-25 DIAGNOSIS — J449 Chronic obstructive pulmonary disease, unspecified: Secondary | ICD-10-CM | POA: Insufficient documentation

## 2014-10-25 DIAGNOSIS — F419 Anxiety disorder, unspecified: Secondary | ICD-10-CM | POA: Insufficient documentation

## 2014-10-25 DIAGNOSIS — J45909 Unspecified asthma, uncomplicated: Secondary | ICD-10-CM | POA: Insufficient documentation

## 2014-10-25 DIAGNOSIS — R51 Headache: Secondary | ICD-10-CM

## 2014-10-25 DIAGNOSIS — R059 Cough, unspecified: Secondary | ICD-10-CM

## 2014-10-25 DIAGNOSIS — F17210 Nicotine dependence, cigarettes, uncomplicated: Secondary | ICD-10-CM | POA: Insufficient documentation

## 2014-10-25 DIAGNOSIS — R05 Cough: Secondary | ICD-10-CM

## 2014-10-25 DIAGNOSIS — R519 Headache, unspecified: Secondary | ICD-10-CM | POA: Diagnosis present

## 2014-10-25 DIAGNOSIS — Q211 Atrial septal defect: Secondary | ICD-10-CM | POA: Insufficient documentation

## 2014-10-25 DIAGNOSIS — R531 Weakness: Secondary | ICD-10-CM

## 2014-10-25 DIAGNOSIS — Z791 Long term (current) use of non-steroidal anti-inflammatories (NSAID): Secondary | ICD-10-CM | POA: Insufficient documentation

## 2014-10-25 DIAGNOSIS — Z7951 Long term (current) use of inhaled steroids: Secondary | ICD-10-CM | POA: Insufficient documentation

## 2014-10-25 DIAGNOSIS — F329 Major depressive disorder, single episode, unspecified: Secondary | ICD-10-CM | POA: Insufficient documentation

## 2014-10-25 DIAGNOSIS — M6289 Other specified disorders of muscle: Secondary | ICD-10-CM

## 2014-10-25 DIAGNOSIS — Z7982 Long term (current) use of aspirin: Secondary | ICD-10-CM | POA: Insufficient documentation

## 2014-10-25 DIAGNOSIS — I2510 Atherosclerotic heart disease of native coronary artery without angina pectoris: Secondary | ICD-10-CM

## 2014-10-25 DIAGNOSIS — M797 Fibromyalgia: Secondary | ICD-10-CM | POA: Insufficient documentation

## 2014-10-25 DIAGNOSIS — Z79899 Other long term (current) drug therapy: Secondary | ICD-10-CM | POA: Insufficient documentation

## 2014-10-25 LAB — RAPID URINE DRUG SCREEN, HOSP PERFORMED
AMPHETAMINES: POSITIVE — AB
BENZODIAZEPINES: NOT DETECTED
Barbiturates: NOT DETECTED
Cocaine: NOT DETECTED
OPIATES: NOT DETECTED
Tetrahydrocannabinol: NOT DETECTED

## 2014-10-25 LAB — COMPREHENSIVE METABOLIC PANEL
ALT: 17 U/L (ref 14–54)
ANION GAP: 7 (ref 5–15)
AST: 19 U/L (ref 15–41)
Albumin: 3.4 g/dL — ABNORMAL LOW (ref 3.5–5.0)
Alkaline Phosphatase: 58 U/L (ref 38–126)
BUN: 7 mg/dL (ref 6–20)
CHLORIDE: 108 mmol/L (ref 101–111)
CO2: 26 mmol/L (ref 22–32)
Calcium: 9.1 mg/dL (ref 8.9–10.3)
Creatinine, Ser: 0.6 mg/dL (ref 0.44–1.00)
GFR calc non Af Amer: >60 mL/min (ref >60)
Glucose, Bld: 81 mg/dL (ref 65–99)
Potassium: 3.6 mmol/L (ref 3.5–5.1)
SODIUM: 141 mmol/L (ref 135–145)
Total Bilirubin: 0.3 mg/dL (ref 0.3–1.2)
Total Protein: 6.1 g/dL — ABNORMAL LOW (ref 6.5–8.1)

## 2014-10-25 LAB — CBC
HEMATOCRIT: 35.4 % — AB (ref 36.0–46.0)
HEMOGLOBIN: 11.5 g/dL — AB (ref 12.0–15.0)
MCH: 28.5 pg (ref 26.0–34.0)
MCHC: 32.5 g/dL (ref 30.0–36.0)
MCV: 87.6 fL (ref 78.0–100.0)
Platelets: 257 10*3/uL (ref 150–400)
RBC: 4.04 MIL/uL (ref 3.87–5.11)
RDW: 15.1 % (ref 11.5–15.5)
WBC: 5.3 10*3/uL (ref 4.0–10.5)

## 2014-10-25 LAB — DIFFERENTIAL
BASOS PCT: 1 %
Basophils Absolute: 0 10*3/uL (ref 0.0–0.1)
EOS PCT: 3 %
Eosinophils Absolute: 0.1 10*3/uL (ref 0.0–0.7)
Lymphocytes Relative: 43 %
Lymphs Abs: 2.3 10*3/uL (ref 0.7–4.0)
MONO ABS: 0.4 10*3/uL (ref 0.1–1.0)
Monocytes Relative: 7 %
NEUTROS ABS: 2.5 10*3/uL (ref 1.7–7.7)
NEUTROS PCT: 46 %

## 2014-10-25 LAB — URINALYSIS, ROUTINE W REFLEX MICROSCOPIC
GLUCOSE, UA: NEGATIVE mg/dL
HGB URINE DIPSTICK: NEGATIVE
Ketones, ur: 15 mg/dL — AB
Leukocytes, UA: NEGATIVE
Nitrite: NEGATIVE
Protein, ur: NEGATIVE mg/dL
SPECIFIC GRAVITY, URINE: 1.017 (ref 1.005–1.030)
Urobilinogen, UA: 0.2 mg/dL (ref 0.0–1.0)
pH: 6.5 (ref 5.0–8.0)

## 2014-10-25 LAB — APTT: APTT: 29 s (ref 24–37)

## 2014-10-25 LAB — I-STAT TROPONIN, ED: Troponin i, poc: 0 ng/mL (ref 0.00–0.08)

## 2014-10-25 LAB — PROTIME-INR
INR: 1.06 (ref 0.00–1.49)
Prothrombin Time: 14 seconds (ref 11.6–15.2)

## 2014-10-25 LAB — ETHANOL

## 2014-10-25 MED ORDER — STROKE: EARLY STAGES OF RECOVERY BOOK
Freq: Once | Status: AC
  Administered 2014-10-25: 23:00:00
  Filled 2014-10-25: qty 1

## 2014-10-25 MED ORDER — KETOROLAC TROMETHAMINE 30 MG/ML IJ SOLN
30.0000 mg | Freq: Once | INTRAMUSCULAR | Status: AC
  Administered 2014-10-26: 30 mg via INTRAVENOUS
  Filled 2014-10-25: qty 1

## 2014-10-25 MED ORDER — DIPHENHYDRAMINE HCL 50 MG/ML IJ SOLN
25.0000 mg | Freq: Once | INTRAMUSCULAR | Status: AC
  Administered 2014-10-25: 25 mg via INTRAVENOUS
  Filled 2014-10-25: qty 1

## 2014-10-25 MED ORDER — SODIUM CHLORIDE 0.9 % IV BOLUS (SEPSIS)
1000.0000 mL | Freq: Once | INTRAVENOUS | Status: AC
  Administered 2014-10-25: 1000 mL via INTRAVENOUS

## 2014-10-25 MED ORDER — ENOXAPARIN SODIUM 40 MG/0.4ML ~~LOC~~ SOLN
40.0000 mg | SUBCUTANEOUS | Status: DC
  Administered 2014-10-25 – 2014-10-26 (×2): 40 mg via SUBCUTANEOUS
  Filled 2014-10-25 (×2): qty 0.4

## 2014-10-25 MED ORDER — METOCLOPRAMIDE HCL 5 MG/ML IJ SOLN
10.0000 mg | Freq: Once | INTRAMUSCULAR | Status: AC
  Administered 2014-10-25: 10 mg via INTRAVENOUS
  Filled 2014-10-25: qty 2

## 2014-10-25 MED ORDER — LORAZEPAM 2 MG/ML IJ SOLN
0.5000 mg | Freq: Once | INTRAMUSCULAR | Status: AC
  Administered 2014-10-26: 0.5 mg via INTRAVENOUS
  Filled 2014-10-25: qty 1

## 2014-10-25 MED ORDER — SENNOSIDES-DOCUSATE SODIUM 8.6-50 MG PO TABS: 1.0000 | ORAL_TABLET | Freq: Every evening | ORAL | Status: DC | PRN

## 2014-10-25 MED ORDER — METOCLOPRAMIDE HCL 5 MG/ML IJ SOLN
10.0000 mg | Freq: Once | INTRAMUSCULAR | Status: AC
  Administered 2014-10-26: 10 mg via INTRAVENOUS
  Filled 2014-10-25: qty 2

## 2014-10-25 MED ORDER — DIPHENHYDRAMINE HCL 50 MG/ML IJ SOLN
25.0000 mg | Freq: Once | INTRAMUSCULAR | Status: AC
  Administered 2014-10-26: 25 mg via INTRAVENOUS
  Filled 2014-10-25: qty 1

## 2014-10-25 MED ORDER — PROCHLORPERAZINE EDISYLATE 5 MG/ML IJ SOLN
10.0000 mg | Freq: Four times a day (QID) | INTRAMUSCULAR | Status: DC | PRN
  Administered 2014-10-25: 10 mg via INTRAVENOUS
  Filled 2014-10-25: qty 2

## 2014-10-25 MED ORDER — ASPIRIN EC 81 MG PO TBEC
81.0000 mg | DELAYED_RELEASE_TABLET | Freq: Every day | ORAL | Status: DC
  Administered 2014-10-25 – 2014-10-27 (×3): 81 mg via ORAL
  Filled 2014-10-25 (×3): qty 1

## 2014-10-25 MED ORDER — FENTANYL CITRATE (PF) 100 MCG/2ML IJ SOLN
50.0000 ug | Freq: Once | INTRAMUSCULAR | Status: AC
Start: 2014-10-25 — End: 2014-10-25
  Administered 2014-10-25: 50 ug via INTRAVENOUS
  Filled 2014-10-25: qty 2

## 2014-10-25 NOTE — ED Notes (Signed)
Attempted report x1. 

## 2014-10-25 NOTE — H&P (Signed)
[facility] History and Physical  Patient: Diana Benson  MRN: [id number]  DOB: 1965-06-09  DOS: the patient was seen and examined on 10/25/2014 PCP: JEFFERY,CHELLE, PA-C  Referring physician: Dr. Radford Pax Chief Complaint: Right facial droop  HPI: Diana Benson is a 49 y.o. female with Past medical history of COPD, ADHD, GERD, CVA, noncompliance, PFO, coronary artery disease. Patient presents with complaints of right-sided facial droop that she has noted earlier in the morning. She was at her baseline when she went to sleep last night. She denies having any complaints of tingling or numbness on the face but does have some right-sided weakness. No fall no trauma no injury or dizziness or lightheadedness. She does complain of some blurred vision. She mentions that the symptoms have been steady since they started. She denies any nausea or vomiting and no diarrhea or constipation no burning urination. She has not been taking any of her medication since last 2 months she has she has ran out of it and she does not have any money for regular office visit follow-up.  The patient is coming from home  At her baseline ambulates without support And is independent for most of her ADL; manages her medication on her own.  Review of Systems: as mentioned in the history of present illness.  A comprehensive review of the other systems is negative.  Past Medical History  Diagnosis Date  . COPD (chronic obstructive pulmonary disease) with chronic bronchitis (HCC)   . CVA (cerebral vascular accident) [facility]) December 2008    Status post left-sided CAE  . GERD (gastroesophageal reflux disease)   . Asthma   . ADHD (attention deficit hyperactivity disorder)   . Anxiety   . Depression   . Fibromyalgia   . Tobacco use disorder, continuous 01/09/2013  . PFO (patent foramen ovale) 01/09/2013  . Coronary artery calcification seen on CAT scan 01/09/2013   Past Surgical History  Procedure Laterality Date  .  Appendectomy    . Abdominal hysterectomy    . 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 Tonsillectomy    . Cosmetic surgery      Post GOP  . Carotid endarterectomy Left December 2008    [facility]   Social History:  reports that she has been smoking Cigarettes.  She has a 4.25 pack-year smoking history. She has never used smokeless tobacco. She reports that she drinks alcohol. She reports that she does not use illicit drugs.  Allergies  Allergen Reactions  . Codeine Nausea And Vomiting  . Flexeril [Cyclobenzaprine] Other (See Comments)    "Makes me feel like I'm coming out of my skin."  . Gabapentin Swelling  . Morphine And Related Nausea And Vomiting  . Venlafaxine Nausea Only    Family History  Problem Relation Age of Onset  . Osteoporosis Mother   . Hypertension Mother   . Stroke Father   . Drug abuse Daughter   . Alzheimer's disease Paternal Grandmother   . Transient ischemic attack Paternal Grandmother   . Acute myelogenous leukemia Paternal Grandfather   . Heart Problems Brother     ?murmur    Prior to Admission medications   Medication Sig Start Date End Date Taking? Authorizing Provider  albuterol (PROAIR HFA) 108 (90 BASE) MCG/ACT inhaler Inhale 2 puffs into the lungs every 4 (four) hours as needed for wheezing or shortness of breath. 06/16/13  Yes Chelle Jeffery, PA-C  CALCIUM PO Take 2 tablets by mouth daily.  Yes Historical Provider, MD  esomeprazole (NEXIUM) 40 MG capsule Take 1 capsule (40 mg total) by mouth 2 (two) times daily. 06/16/13  Yes Chelle Jeffery, PA-C  Fluticasone-Salmeterol (ADVAIR DISKUS) 500-50 MCG/DOSE AEPB INHALE 1 PUFF INTO THE LUNGS 2 TIMES DAILY  "OFFICE NEEDED FOR ADDITIONAL REFILLS" 10/05/14  Yes Chelle Jeffery, PA-C  valACYclovir (VALTREX) 1000 MG tablet Take 1 tablet (1,000 mg total) by mouth daily. 06/16/13  Yes Chelle Jeffery, PA-C  albuterol (PROVENTIL) (5 MG/ML) 0.5% nebulizer solution Take 0.5 mLs (2.5 mg  total) by nebulization every 6 (six) hours as needed. Patient not taking: Reported on 10/25/2014 09/18/12   Porfirio Oarhelle Jeffery, PA-C  ALPRAZolam Prudy Feeler(XANAX) 1 MG tablet TAKE 1 TABLET THREE TIMES A DAY AS NEEDED Patient not taking: Reported on 10/25/2014 06/27/14   Porfirio Oarhelle Jeffery, PA-C  amphetamine-dextroamphetamine (ADDERALL XR) 30 MG 24 hr capsule 2 tablets qam and 1 q evening. BRAND MEDICALLY NECESSARY. Patient not taking: Reported on 10/25/2014 06/23/14   Porfirio Oarhelle Jeffery, PA-C  aspirin EC 325 MG EC tablet Take 1 tablet (325 mg total) by mouth daily. Patient not taking: Reported on 10/25/2014 11/26/13   Nishant Dhungel, MD  DULoxetine (CYMBALTA) 60 MG capsule TAKE 1 CAPSULE (60 MG TOTAL) BY MOUTH 2 (TWO) TIMES DAILY. Patient not taking: Reported on 10/25/2014 10/08/13   Porfirio Oarhelle Jeffery, PA-C  DULoxetine (CYMBALTA) 60 MG capsule Take 1 capsule (60 mg total) by mouth 2 (two) times daily. PATIENT NEEDS OFFICE VISIT FOR ADDITIONAL REFILLS 07/20/14   Porfirio Oarhelle Jeffery, PA-C  estrogen-methylTESTOSTERone (EST ESTROGENS-METHYLTEST HS) 0.625-1.25 MG per tablet Take 1 tablet by mouth daily. PATIENT NEEDS OFFICE VISIT FOR ADDITIONAL REFILLS Patient not taking: Reported on 10/25/2014 06/17/14   Porfirio Oarhelle Jeffery, PA-C  fluticasone (FLONASE) 50 MCG/ACT nasal spray PLACE 2 SPRAYS IN each nostril DAILY Patient not taking: Reported on 10/25/2014 06/16/13   Chelle Jeffery, PA-C  guaiFENesin-dextromethorphan (ROBITUSSIN DM) 100-10 MG/5ML syrup Take 5 mLs by mouth every 4 (four) hours as needed for cough. Patient not taking: Reported on 10/25/2014 11/26/13   Nishant Dhungel, MD  HYDROcodone-acetaminophen (NORCO/VICODIN) 5-325 MG per tablet Take 1-2 tablets by mouth every 4 (four) hours as needed. Patient not taking: Reported on 10/25/2014 09/15/14   Marlon Peliffany Greene, PA-C  ibuprofen (ADVIL,MOTRIN) 600 MG tablet Take 1 tablet (600 mg total) by mouth every 6 (six) hours as needed. Patient not taking: Reported on 10/25/2014 09/15/14   Marlon Peliffany  Greene, PA-C  montelukast (SINGULAIR) 10 MG tablet Take 1 tablet (10 mg total) by mouth at bedtime. PATIENT NEEDS OFFICE VISIT FOR ADDITIONAL REFILLS Patient not taking: Reported on 10/25/2014 07/20/14   Porfirio Oarhelle Jeffery, PA-C  nicotine (NICODERM CQ) 7 mg/24hr patch Place 1 patch (7 mg total) onto the skin daily. Patient not taking: Reported on 10/25/2014 11/26/13   Nishant Dhungel, MD  ondansetron (ZOFRAN) 4 MG tablet Take 1 tablet (4 mg total) by mouth every 6 (six) hours. Patient not taking: Reported on 10/25/2014 09/15/14   Marlon Peliffany Greene, PA-C  zolpidem (AMBIEN) 10 MG tablet TAKE 1- 1.5 TABLET BY MOUTH AT BEDTIME Patient not taking: Reported on 10/25/2014 06/23/14   Porfirio Oarhelle Jeffery, PA-C    Physical Exam: Filed Vitals:   10/25/14 1915 10/25/14 1930 10/25/14 1945 10/25/14 2015  BP: 110/65 115/55 109/65 94/59  Pulse: 75 73 77 71  Temp:      TempSrc:      Resp: 15 17 18 15   SpO2:  94% 97% 95%    General: Alert, Awake and Oriented to Time, Place and Person.  Appear in mild distress Eyes: PERRL ENT: Oral Mucosa clear moist. Neck: no JVD Cardiovascular: S1 and S2 Present, no Murmur, Peripheral Pulses Present Respiratory: Bilateral Air entry equal and Decreased,  Clear to Auscultation, no Crackles, no wheezes Abdomen: Bowel Sound present, Soft and no tenderness Skin: no Rash Extremities: no Pedal edema, no calf tenderness Neurologic: Mental status AAOx3, speech normal, attention normal,  Cranial Nerves PERRL, EOM normal and present Facial droop noted on the right side.  Motor strength left upper and lower 5/5 Right 3/5 probably due to poor effort Sensation present to light touch, left more than right Reflexes present knee and biceps, babinski equivocal,  Cerebellar test normal finger nose finger.  Labs on Admission:  CBC:  Recent Labs Lab 10/25/14 1458  WBC 5.3  NEUTROABS 2.5  HGB 11.5*  HCT 35.4*  MCV 87.6  PLT 257    CMP     Component Value Date/Time   NA 141  10/25/2014 1458   K 3.6 10/25/2014 1458   CL 108 10/25/2014 1458   CO2 26 10/25/2014 1458   GLUCOSE 81 10/25/2014 1458   BUN 7 10/25/2014 1458   CREATININE 0.60 10/25/2014 1458   CALCIUM 9.1 10/25/2014 1458   PROT 6.1* 10/25/2014 1458   ALBUMIN 3.4* 10/25/2014 1458   AST 19 10/25/2014 1458   ALT 17 10/25/2014 1458   ALKPHOS 58 10/25/2014 1458   BILITOT 0.3 10/25/2014 1458   GFRNONAA >60 10/25/2014 1458   GFRAA >60 10/25/2014 1458    No results for input(s): CKTOTAL, CKMB, CKMBINDEX, TROPONINI in the last 168 hours. BNP (last 3 results) No results for input(s): BNP in the last 8760 hours.  ProBNP (last 3 results) No results for input(s): PROBNP in the last 8760 hours.   Radiological Exams on Admission: Ct Head Wo Contrast  10/25/2014  CLINICAL DATA:  Slurred speech, right side facial droop EXAM: CT HEAD WITHOUT CONTRAST TECHNIQUE: Contiguous axial images were obtained from the base of the skull through the vertex without intravenous contrast. COMPARISON:  11/24/2013 FINDINGS: No skull fracture is noted. Paranasal sinuses and mastoid air cells are unremarkable. No intracranial hemorrhage, mass effect or midline shift. No acute cortical infarction. No hydrocephalus. No mass lesion is noted on this unenhanced scan. The gray and white-matter differentiation is preserved. No intra or extra-axial fluid collection. IMPRESSION: No acute intracranial abnormality. Electronically Signed   By: Natasha Mead M.D.   On: 10/25/2014 16:28   EKG: Independently reviewed. normal sinus rhythm, nonspecific ST and T waves changes.  Assessment/Plan 1. TIA (transient ischemic attack) Patient presents with complains of right-sided facial droop as well as right-sided generalized weakness and numbness. CT scan head is unremarkable. MRI head is currently pending. Neurology has admitted the patient recommends further workup. The patient will be admitted in telemetry. We will continue with aspirin. PTOT  speech evaluation. Nothing by mouth pending speech evaluation.  2. Poor compliance.   ADHD (attention deficit hyperactivity disorder) Currently we will be holding off on resuming her home medication.  3  Coronary artery calcification seen on CAT scan Continue with aspirin.  4  Tobacco use disorder, continuous Nicotine patch.  5  Headache When necessary Fioricet  Nutrition: Nothing by mouth pending stroke evaluation DVT Prophylaxis: subcutaneous Heparin  Advance goals of care discussion: Full code   Consults: Neurology  Disposition: Admitted as observation  telemetry unit.  Author: Lynden Oxford, MD [facility] Pager: [phone] 10/25/2014  If 7PM-7AM, please contact night-coverage [email or web address] Password [email or web address]

## 2014-10-25 NOTE — ED Notes (Signed)
[facility]- pt here with stroke like symptoms. Pt has right side facial droop, right side weakness, blurred vision, and headache. Pt is alert and oriented X4. Able to answer all questions appropriately, vital signs stable. Hx of stroke X3.

## 2014-10-25 NOTE — ED Provider Notes (Signed)
CSN: [id number]     Arrival date & time 10/25/14  1352 History   First MD Initiated Contact with Patient 10/25/14 1408     Chief Complaint  Patient presents with  . Facial Droop  . Headache      Patient is a 49 y.o. female presenting with headaches. The history is provided by the patient. No language interpreter was used.  Headache  Diana Benson presents for evaluation of HA and facial droop. Sxs started upon waking at 10 am today.  Last seen normal was last night around 11pm.  She reports right sided facial weakness and numbness, RUE/RLE weakness, blurred vision.  She has a moderate right sided posterior HA.  Sxs are constant in nature.  No fevers, vomiting, diarrhea, chest pain, sob.  She reports prior sxs previously and was diagnosed with CVA.  She does not take any blood thinners currently.   Past Medical History  Diagnosis Date  . COPD (chronic obstructive pulmonary disease) with chronic bronchitis (HCC)   . CVA (cerebral vascular accident) [facility]) December 2008    Status post left-sided CAE  . GERD (gastroesophageal reflux disease)   . Asthma   . ADHD (attention deficit hyperactivity disorder)   . Anxiety   . Depression   . Fibromyalgia   . Tobacco use disorder, continuous 01/09/2013  . PFO (patent foramen ovale) 01/09/2013  . Coronary artery calcification seen on CAT scan 01/09/2013   Past Surgical History  Procedure Laterality Date  . Appendectomy    . Abdominal hysterectomy    . 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 Tonsillectomy    . Cosmetic surgery      Post GOP  . Carotid endarterectomy Left December 2008    [facility]   Family History  Problem Relation Age of Onset  . Osteoporosis Mother   . Hypertension Mother   . Stroke Father   . Drug abuse Daughter   . Alzheimer's disease Paternal Grandmother   . Transient ischemic attack Paternal Grandmother   . Acute myelogenous leukemia Paternal Grandfather   . Heart Problems Brother      ?murmur   Social History  Substance Use Topics  . Smoking status: Current Every Day Smoker -- 0.25 packs/day for 17 years    Types: Cigarettes    Last Attempt to Quit: 06/09/2010  . Smokeless tobacco: Never Used     Comment: 1 pack of cigarettes/week (01/09/2013)  . Alcohol Use: Yes     Comment: rare   OB History    No data available     Review of Systems  Neurological: Positive for headaches.  All other systems reviewed and are negative.     Allergies  Codeine; Flexeril; Gabapentin; Morphine and related; and Venlafaxine  Home Medications   Prior to Admission medications   Medication Sig Start Date End Date Taking? Authorizing Provider  albuterol (PROAIR HFA) 108 (90 BASE) MCG/ACT inhaler Inhale 2 puffs into the lungs every 4 (four) hours as needed for wheezing or shortness of breath. 06/16/13  Yes Diana Jeffery, PA-C  CALCIUM PO Take 2 tablets by mouth daily.   Yes Historical Provider, MD  esomeprazole (NEXIUM) 40 MG capsule Take 1 capsule (40 mg total) by mouth 2 (two) times daily. 06/16/13  Yes Diana Jeffery, PA-C  Fluticasone-Salmeterol (ADVAIR DISKUS) 500-50 MCG/DOSE AEPB INHALE 1 PUFF INTO THE LUNGS 2 TIMES DAILY  "OFFICE NEEDED FOR ADDITIONAL REFILLS" 10/05/14  Yes Diana Jeffery, PA-C  valACYclovir (VALTREX) 1000  MG tablet Take 1 tablet (1,000 mg total) by mouth daily. 06/16/13  Yes Diana Jeffery, PA-C  albuterol (PROVENTIL) (5 MG/ML) 0.5% nebulizer solution Take 0.5 mLs (2.5 mg total) by nebulization every 6 (six) hours as needed. Patient not taking: Reported on 10/25/2014 09/18/12   Diana Oar, PA-C  ALPRAZolam Prudy Feeler) 1 MG tablet TAKE 1 TABLET THREE TIMES A DAY AS NEEDED Patient not taking: Reported on 10/25/2014 06/27/14   Diana Oar, PA-C  amphetamine-dextroamphetamine (ADDERALL XR) 30 MG 24 hr capsule 2 tablets qam and 1 q evening. BRAND MEDICALLY NECESSARY. Patient not taking: Reported on 10/25/2014 06/23/14   Diana Oar, PA-C  aspirin EC 325 MG EC tablet  Take 1 tablet (325 mg total) by mouth daily. Patient not taking: Reported on 10/25/2014 11/26/13   Diana Dhungel, MD  DULoxetine (CYMBALTA) 60 MG capsule TAKE 1 CAPSULE (60 MG TOTAL) BY MOUTH 2 (TWO) TIMES DAILY. Patient not taking: Reported on 10/25/2014 10/08/13   Diana Oar, PA-C  DULoxetine (CYMBALTA) 60 MG capsule Take 1 capsule (60 mg total) by mouth 2 (two) times daily. PATIENT NEEDS OFFICE VISIT FOR ADDITIONAL REFILLS 07/20/14   Diana Oar, PA-C  estrogen-methylTESTOSTERone (EST ESTROGENS-METHYLTEST HS) 0.625-1.25 MG per tablet Take 1 tablet by mouth daily. PATIENT NEEDS OFFICE VISIT FOR ADDITIONAL REFILLS Patient not taking: Reported on 10/25/2014 06/17/14   Diana Oar, PA-C  fluticasone (FLONASE) 50 MCG/ACT nasal spray PLACE 2 SPRAYS IN each nostril DAILY Patient not taking: Reported on 10/25/2014 06/16/13   Diana Jeffery, PA-C  guaiFENesin-dextromethorphan (ROBITUSSIN DM) 100-10 MG/5ML syrup Take 5 mLs by mouth every 4 (four) hours as needed for cough. Patient not taking: Reported on 10/25/2014 11/26/13   Diana Dhungel, MD  HYDROcodone-acetaminophen (NORCO/VICODIN) 5-325 MG per tablet Take 1-2 tablets by mouth every 4 (four) hours as needed. Patient not taking: Reported on 10/25/2014 09/15/14   Diana Pel, PA-C  ibuprofen (ADVIL,MOTRIN) 600 MG tablet Take 1 tablet (600 mg total) by mouth every 6 (six) hours as needed. Patient not taking: Reported on 10/25/2014 09/15/14   Diana Pel, PA-C  montelukast (SINGULAIR) 10 MG tablet Take 1 tablet (10 mg total) by mouth at bedtime. PATIENT NEEDS OFFICE VISIT FOR ADDITIONAL REFILLS Patient not taking: Reported on 10/25/2014 07/20/14   Diana Oar, PA-C  nicotine (NICODERM CQ) 7 mg/24hr patch Place 1 patch (7 mg total) onto the skin daily. Patient not taking: Reported on 10/25/2014 11/26/13   Diana Dhungel, MD  ondansetron (ZOFRAN) 4 MG tablet Take 1 tablet (4 mg total) by mouth every 6 (six) hours. Patient not taking:  Reported on 10/25/2014 09/15/14   Diana Pel, PA-C  zolpidem (AMBIEN) 10 MG tablet TAKE 1- 1.5 TABLET BY MOUTH AT BEDTIME Patient not taking: Reported on 10/25/2014 06/23/14   Diana Jeffery, PA-C   BP 113/76 mmHg  Pulse 67  Resp 14  SpO2 97% Physical Exam  Constitutional: She is oriented to person, place, and time. She appears well-developed and well-nourished.  HENT:  Head: Normocephalic and atraumatic.  Eyes: EOM are normal. Pupils are equal, round, and reactive to light.  Cardiovascular: Normal rate and regular rhythm.   No murmur heard. Pulmonary/Chest: Effort normal and breath sounds normal. No respiratory distress.  Abdominal: Soft. There is no tenderness. There is no rebound and no guarding.  Musculoskeletal: She exhibits no edema or tenderness.  Neurological: She is alert and oriented to person, place, and time.  Right sided facial droop.  4/5 strength in RUE/RLE.  Sensation to light touch intact throughout all four  extremities.    Skin: Skin is warm and dry.  Psychiatric: She has a normal mood and affect. Her behavior is normal.  Nursing note and vitals reviewed.   ED Course  Procedures (including critical care time) Labs Review Labs Reviewed  CBC - Abnormal; Notable for the following:    Hemoglobin 11.5 (*)    HCT 35.4 (*)    All other components within normal limits  DIFFERENTIAL  ETHANOL  PROTIME-INR  APTT  COMPREHENSIVE METABOLIC PANEL  URINE RAPID DRUG SCREEN, HOSP PERFORMED  URINALYSIS, ROUTINE W REFLEX MICROSCOPIC (NOT [facility])  I-STAT TROPOININ, ED    Imaging Review No results found. I have personally reviewed and evaluated these images and lab results as part of my medical decision-making.   EKG Interpretation   Date/Time:  Monday October 25 2014 13:58:00 EDT Ventricular Rate:  77 PR Interval:  170 QRS Duration: 83 QT Interval:  390 QTC Calculation: 441 R Axis:   69 Text Interpretation:  Sinus rhythm Ventricular premature complex Low   voltage, precordial leads Confirmed by Lincoln Brighamees, Diana Benson [phone]([id number]) on 10/25/2014  2:08:09 PM      MDM   Final diagnoses:  None    Pt here for evaluation of HA, right sided weakness, has hx/o CVA.  Question complicated migraine vs recurrent CVA.  Pt is not a TPA candidate given duration of sxs.  Treating HA pending CT scan, labs. Pt care transferred pending CT scan.       Tilden FossaElizabeth Daijon Wenke, MD 10/25/14 [phone]

## 2014-10-25 NOTE — ED Notes (Signed)
Neurology at bedside.

## 2014-10-25 NOTE — Consult Note (Addendum)
Referring Physician: ED    Chief Complaint: HA, right sided weakness, slurred spech  HPI:                                                                                                                                         Diana Benson is an 49 y.o. female, right handed, with a past medical history significant for PFO, heavy smoking, reported ischemic stroke in 2008 status post angioplasty involving left carotid artery but no residual deficits, COPD, depression and anxiety, fibromyalgia and attention deficit disorder, comes in for evaluation of the above stated symptoms. Of note, patient previously seen 11//15 by the neurohospitalist and stroke service respectively due to similar presentation, unrevealing stroke work up, and concern of functional overlay. Diana Benson indicated that she went to bed last night feeling well but woke up around 10 am today with a severe HA as well as trouble moving the right side of her face " which also flowed to the right arm and leg". Likewise, she tells me that her speech has been slurred since this morning and the right arm and left are numb and tingly. Denies vertigo or double vision but complains of blurred vision. No difficulty swallowing. Overall, her symptoms are not improving. CT head was personally reviewed and showed no acute abnormality.  She is not taking antiplatelet therapy for secondary stroke prevention.  Date last known well: 10/24/14 Time last known well: 9 pm tPA Given:no, late presentation   Past Medical History  Diagnosis Date  . COPD (chronic obstructive pulmonary disease) with chronic bronchitis ([place])   . CVA (cerebral vascular accident) [facility]) December 2008    Status post left-sided CAE  . GERD (gastroesophageal reflux disease)   . Asthma   . ADHD (attention deficit hyperactivity disorder)   . Anxiety   . Depression   . Fibromyalgia   . Tobacco use disorder, continuous 01/09/2013  . PFO (patent foramen ovale) 01/09/2013  . Coronary  artery calcification seen on CAT scan 01/09/2013    Past Surgical History  Procedure Laterality Date  . Appendectomy    . Abdominal hysterectomy    . 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 Tonsillectomy    . Cosmetic surgery      Post GOP  . Carotid endarterectomy Left December 2008    [place]    Family History  Problem Relation Age of Onset  . Osteoporosis Mother   . Hypertension Mother   . Stroke Father   . Drug abuse Daughter   . Alzheimer's disease Paternal Grandmother   . Transient ischemic attack Paternal Grandmother   . Acute myelogenous leukemia Paternal Grandfather   . Heart Problems Brother     ?murmur   Social History:  reports that she has been smoking Cigarettes.  She has a 4.25 pack-year smoking history. She has never used smokeless tobacco. She  reports that she drinks alcohol. She reports that she does not use illicit drugs. Family history: no stroke at young age, MS, brain tumor,epilepsty, or brain aneurysm Allergies:  Allergies  Allergen Reactions  . Codeine Nausea And Vomiting  . Flexeril [Cyclobenzaprine] Other (See Comments)    "Makes me feel like I'm coming out of my skin."  . Gabapentin Swelling  . Morphine And Related Nausea And Vomiting  . Venlafaxine Nausea Only    Medications:                                                                                                                           I have reviewed the patient's current medications.  ROS:                                                                                                                                       History obtained from patient and chart review  General ROS: negative for - chills, fatigue, fever, night sweats, or weight loss Psychological ROS: negative for - behavioral disorder, hallucinations, memory difficulties, mood swings or suicidal ideation Ophthalmic ROS: negative for - blurry vision, double vision, eye pain or loss of  vision ENT ROS: negative for - epistaxis, nasal discharge, oral lesions, sore throat, tinnitus or vertigo Allergy and Immunology ROS: negative for - hives or itchy/watery eyes Hematological and Lymphatic ROS: negative for - bleeding problems, bruising or swollen lymph nodes Endocrine ROS: negative for - galactorrhea, hair pattern changes, polydipsia/polyuria or temperature intolerance Respiratory ROS: negative for - cough, hemoptysis, shortness of breath or wheezing Cardiovascular ROS: negative for - chest pain, dyspnea on exertion, edema or irregular heartbeat Gastrointestinal ROS: negative for - abdominal pain, diarrhea, hematemesis, nausea/vomiting or stool incontinence Genito-Urinary ROS: negative for - dysuria, hematuria, incontinence or urinary frequency/urgency Musculoskeletal ROS: negative for - joint swelling Neurological ROS: as noted in HPI Dermatological ROS: negative for rash and skin lesion changes    Physical exam:  Constitutional: well developed, pleasant female in no apparent distress. Blood pressure 110/65, pulse 75, temperature 98.3 F (36.8 C), temperature source Oral, resp. rate 15, SpO2 95 %. Eyes: no jaundice or exophthalmos.  Head: normocephalic. Neck: supple, no bruits, no JVD. Cardiac: no murmurs. Lungs: clear. Abdomen: soft, no tender, no mass. Extremities: no edema, clubbing, or cyanosis.  Skin: no rash  Neurologic Examination:  General: NAD Mental Status: Alert, oriented, thought content appropriate.  Speech fluent without evidence of aphasia.  Able to follow 3 step commands without difficulty. Cranial Nerves: II: Discs flat bilaterally; Visual fields grossly normal, pupils equal, round, reactive to light and accommodation III,IV, VI: ptosis not present, extra-ocular motions intact bilaterally V,VII: smile asymmetric due to left face weakness, facial light  touch sensation diminished in the right face VIII: hearing normal bilaterally IX,X: uvula rises symmetrically XI: bilateral shoulder shrug XII: midline tongue extension without atrophy or fasciculations Motor: Significant for right sided weakness but patient doesn't give full effort Tone and bulk:normal tone throughout; no atrophy noted Sensory: Pinprick and light touch diminished in the right side Deep Tendon Reflexes:  Right: Upper Extremity   Left: Upper extremity   biceps (C-5 to C-6) 2/4   biceps (C-5 to C-6) 2/4 tricep (C7) 2/4    triceps (C7) 2/4 Brachioradialis (C6) 2/4  Brachioradialis (C6) 2/4  Lower Extremity Lower Extremity  quadriceps (L-2 to L-4) 2/4   quadriceps (L-2 to L-4) 2/4 Achilles (S1) 2/4   Achilles (S1) 2/4  Plantars: Right: downgoing   Left: downgoing Cerebellar: normal finger-to-nose,  normal heel-to-shin test Gait:  No tested due to multiple leads    Results for orders placed or performed during the hospital encounter of 10/25/14 (from the past 48 hour(s))  Ethanol     Status: None   Collection Time: 10/25/14  2:58 PM  Result Value Ref Range   Alcohol, Ethyl (B) <5 <5 mg/dL    Comment:        LOWEST DETECTABLE LIMIT FOR SERUM ALCOHOL IS 5 mg/dL FOR MEDICAL PURPOSES ONLY   Protime-INR     Status: None   Collection Time: 10/25/14  2:58 PM  Result Value Ref Range   Prothrombin Time 14.0 11.6 - 15.2 seconds   INR 1.06 0.00 - 1.49  APTT     Status: None   Collection Time: 10/25/14  2:58 PM  Result Value Ref Range   aPTT 29 24 - 37 seconds  CBC     Status: Abnormal   Collection Time: 10/25/14  2:58 PM  Result Value Ref Range   WBC 5.3 4.0 - 10.5 K/uL   RBC 4.04 3.87 - 5.11 MIL/uL   Hemoglobin 11.5 (L) 12.0 - 15.0 g/dL   HCT 35.4 (L) 36.0 - 46.0 %   MCV 87.6 78.0 - 100.0 fL   MCH 28.5 26.0 - 34.0 pg   MCHC 32.5 30.0 - 36.0 g/dL   RDW 15.1 11.5 - 15.5 %   Platelets 257 150 - 400 K/uL  Differential     Status: None   Collection Time:  10/25/14  2:58 PM  Result Value Ref Range   Neutrophils Relative % 46 %   Neutro Abs 2.5 1.7 - 7.7 K/uL   Lymphocytes Relative 43 %   Lymphs Abs 2.3 0.7 - 4.0 K/uL   Monocytes Relative 7 %   Monocytes Absolute 0.4 0.1 - 1.0 K/uL   Eosinophils Relative 3 %   Eosinophils Absolute 0.1 0.0 - 0.7 K/uL   Basophils Relative 1 %   Basophils Absolute 0.0 0.0 - 0.1 K/uL  Comprehensive metabolic panel     Status: Abnormal   Collection Time: 10/25/14  2:58 PM  Result Value Ref Range   Sodium 141 135 - 145 mmol/L   Potassium 3.6 3.5 - 5.1 mmol/L   Chloride 108 101 - 111 mmol/L   CO2 26 22 - 32 mmol/L  Glucose, Bld 81 65 - 99 mg/dL   BUN 7 6 - 20 mg/dL   Creatinine, Ser 0.60 0.44 - 1.00 mg/dL   Calcium 9.1 8.9 - 10.3 mg/dL   Total Protein 6.1 (L) 6.5 - 8.1 g/dL   Albumin 3.4 (L) 3.5 - 5.0 g/dL   AST 19 15 - 41 U/L   ALT 17 14 - 54 U/L   Alkaline Phosphatase 58 38 - 126 U/L   Total Bilirubin 0.3 0.3 - 1.2 mg/dL   GFR calc non Af Amer >60 >60 mL/min   GFR calc Af Amer >60 >60 mL/min    Comment: (NOTE) The eGFR has been calculated using the CKD EPI equation. This calculation has not been validated in all clinical situations. eGFR's persistently <60 mL/min signify possible Chronic Kidney Disease.    Anion gap 7 5 - 15  I-stat troponin, ED (not at [facility])     Status: None   Collection Time: 10/25/14  2:59 PM  Result Value Ref Range   Troponin i, poc 0.00 0.00 - 0.08 ng/mL   Comment 3            Comment: Due to the release kinetics of cTnI, a negative result within the first hours of the onset of symptoms does not rule out myocardial infarction with certainty. If myocardial infarction is still suspected, repeat the test at appropriate intervals.    Ct Head Wo Contrast  10/25/2014  CLINICAL DATA:  Slurred speech, right side facial droop EXAM: CT HEAD WITHOUT CONTRAST TECHNIQUE: Contiguous axial images were obtained from the base of the skull through the vertex without intravenous  contrast. COMPARISON:  11/24/2013 FINDINGS: No skull fracture is noted. Paranasal sinuses and mastoid air cells are unremarkable. No intracranial hemorrhage, mass effect or midline shift. No acute cortical infarction. No hydrocephalus. No mass lesion is noted on this unenhanced scan. The gray and white-matter differentiation is preserved. No intra or extra-axial fluid collection. IMPRESSION: No acute intracranial abnormality. Electronically Signed   By: Lahoma Crocker M.D.   On: 10/25/2014 16:28    Assessment: 49 y.o. female with a past medical history significant for PFO, heavy smoking, reported ischemic stroke in 2008 status post angioplasty involving left carotid artery but no residual deficits, COPD, depression and anxiety, come in with complains of HA, right face-arm-leg weakness/numbness, and slurred speech since around 10 am today. Her neuro-exam is inconsistent as she is not giving me full effort on muscle strength testing and the let face is " weak" despite the fact that she really complains of right face weakness. Did not receive tpa due to late presentation. She tells me that she had rather identical symptoms with her prior stroke and thus I recommended admission to the hospital in order to pursue further neuroimaging and remaining of stroke work if MRI + for stroke. Aspirin. Stroke team will follow up tomorrow.  Dorian Pod, MD [facility] [phone]  10/25/2014, 7:25 PM

## 2014-10-25 NOTE — ED Notes (Signed)
Pt remains monitored by blood pressure, pulse ox, and 12 lead. Pt encouraged to provide urine specimen

## 2014-10-26 ENCOUNTER — Observation Stay (HOSPITAL_COMMUNITY): Payer: Self-pay

## 2014-10-26 ENCOUNTER — Observation Stay (HOSPITAL_BASED_OUTPATIENT_CLINIC_OR_DEPARTMENT_OTHER): Payer: Self-pay

## 2014-10-26 DIAGNOSIS — E785 Hyperlipidemia, unspecified: Secondary | ICD-10-CM

## 2014-10-26 DIAGNOSIS — Z720 Tobacco use: Secondary | ICD-10-CM

## 2014-10-26 DIAGNOSIS — G44219 Episodic tension-type headache, not intractable: Secondary | ICD-10-CM

## 2014-10-26 DIAGNOSIS — I6789 Other cerebrovascular disease: Secondary | ICD-10-CM

## 2014-10-26 DIAGNOSIS — M6289 Other specified disorders of muscle: Secondary | ICD-10-CM

## 2014-10-26 DIAGNOSIS — G459 Transient cerebral ischemic attack, unspecified: Principal | ICD-10-CM

## 2014-10-26 DIAGNOSIS — G44209 Tension-type headache, unspecified, not intractable: Secondary | ICD-10-CM

## 2014-10-26 LAB — CBC WITH DIFFERENTIAL/PLATELET
Basophils Absolute: 0 10*3/uL (ref 0.0–0.1)
Basophils Relative: 1 %
EOS ABS: 0.3 10*3/uL (ref 0.0–0.7)
Eosinophils Relative: 5 %
HEMATOCRIT: 34 % — AB (ref 36.0–46.0)
HEMOGLOBIN: 11 g/dL — AB (ref 12.0–15.0)
LYMPHS ABS: 2.3 10*3/uL (ref 0.7–4.0)
LYMPHS PCT: 44 %
MCH: 28.6 pg (ref 26.0–34.0)
MCHC: 32.4 g/dL (ref 30.0–36.0)
MCV: 88.5 fL (ref 78.0–100.0)
MONOS PCT: 9 %
Monocytes Absolute: 0.4 10*3/uL (ref 0.1–1.0)
Neutro Abs: 2.1 10*3/uL (ref 1.7–7.7)
Neutrophils Relative %: 41 %
Platelets: 249 10*3/uL (ref 150–400)
RBC: 3.84 MIL/uL — ABNORMAL LOW (ref 3.87–5.11)
RDW: 15.2 % (ref 11.5–15.5)
WBC: 5.1 10*3/uL (ref 4.0–10.5)

## 2014-10-26 LAB — COMPREHENSIVE METABOLIC PANEL
ALK PHOS: 57 U/L (ref 38–126)
ALT: 17 U/L (ref 14–54)
ANION GAP: 9 (ref 5–15)
AST: 24 U/L (ref 15–41)
Albumin: 3.1 g/dL — ABNORMAL LOW (ref 3.5–5.0)
BILIRUBIN TOTAL: 0.4 mg/dL (ref 0.3–1.2)
BUN: 8 mg/dL (ref 6–20)
CALCIUM: 8.3 mg/dL — AB (ref 8.9–10.3)
CO2: 24 mmol/L (ref 22–32)
CREATININE: 0.63 mg/dL (ref 0.44–1.00)
Chloride: 103 mmol/L (ref 101–111)
Glucose, Bld: 123 mg/dL — ABNORMAL HIGH (ref 65–99)
Potassium: 3.4 mmol/L — ABNORMAL LOW (ref 3.5–5.1)
Sodium: 136 mmol/L (ref 135–145)
TOTAL PROTEIN: 5.6 g/dL — AB (ref 6.5–8.1)

## 2014-10-26 LAB — LIPID PANEL
Cholesterol: 146 mg/dL (ref 0–200)
HDL: 50 mg/dL (ref >40)
LDL Cholesterol: 67 mg/dL (ref 0–99)
Total CHOL/HDL Ratio: 2.9 RATIO
Triglycerides: 143 mg/dL (ref <150)
VLDL: 29 mg/dL (ref 0–40)

## 2014-10-26 MED ORDER — ALPRAZOLAM 1 MG PO TABS: 1.0000 mg | ORAL_TABLET | Freq: Three times a day (TID) | ORAL | Status: DC | PRN

## 2014-10-26 MED ORDER — ESOMEPRAZOLE MAGNESIUM 40 MG PO CPDR: 40.0000 mg | DELAYED_RELEASE_CAPSULE | Freq: Every day | ORAL | Status: DC

## 2014-10-26 MED ORDER — PNEUMOCOCCAL VAC POLYVALENT 25 MCG/0.5ML IJ INJ
0.5000 mL | INJECTION | INTRAMUSCULAR | Status: AC
  Administered 2014-10-26: 0.5 mL via INTRAMUSCULAR
  Filled 2014-10-26: qty 0.5

## 2014-10-26 MED ORDER — INFLUENZA VAC SPLIT QUAD 0.5 ML IM SUSY
0.5000 mL | PREFILLED_SYRINGE | INTRAMUSCULAR | Status: AC
  Administered 2014-10-26: 0.5 mL via INTRAMUSCULAR

## 2014-10-26 MED ORDER — KETOROLAC TROMETHAMINE 30 MG/ML IJ SOLN
30.0000 mg | Freq: Three times a day (TID) | INTRAMUSCULAR | Status: DC | PRN
  Administered 2014-10-26 – 2014-10-27 (×2): 30 mg via INTRAVENOUS
  Filled 2014-10-26 (×2): qty 1

## 2014-10-26 MED ORDER — ASPIRIN 81 MG PO TBEC: 81.0000 mg | DELAYED_RELEASE_TABLET | Freq: Every day | ORAL | Status: DC

## 2014-10-26 MED ORDER — ALBUTEROL SULFATE HFA 108 (90 BASE) MCG/ACT IN AERS: 2.0000 | INHALATION_SPRAY | RESPIRATORY_TRACT | Status: DC | PRN

## 2014-10-26 MED ORDER — DULOXETINE HCL 60 MG PO CPEP
60.0000 mg | ORAL_CAPSULE | Freq: Two times a day (BID) | ORAL | Status: DC
  Administered 2014-10-26 – 2014-10-27 (×2): 60 mg via ORAL
  Filled 2014-10-26 (×2): qty 1

## 2014-10-26 MED ORDER — VALPROATE SODIUM 500 MG/5ML IV SOLN
500.0000 mg | Freq: Once | INTRAVENOUS | Status: AC
  Administered 2014-10-26: 500 mg via INTRAVENOUS
  Filled 2014-10-26: qty 5

## 2014-10-26 MED ORDER — METOCLOPRAMIDE HCL 5 MG/ML IJ SOLN
10.0000 mg | Freq: Three times a day (TID) | INTRAMUSCULAR | Status: DC | PRN
  Administered 2014-10-26 – 2014-10-27 (×2): 10 mg via INTRAVENOUS
  Filled 2014-10-26 (×2): qty 2

## 2014-10-26 MED ORDER — ALPRAZOLAM 0.5 MG PO TABS
0.5000 mg | ORAL_TABLET | Freq: Two times a day (BID) | ORAL | Status: DC | PRN
  Administered 2014-10-26: 0.5 mg via ORAL
  Filled 2014-10-26: qty 1

## 2014-10-26 MED ORDER — ACETAMINOPHEN 325 MG PO TABS: 650.0000 mg | ORAL_TABLET | Freq: Four times a day (QID) | ORAL | Status: DC | PRN

## 2014-10-26 MED ORDER — DIPHENHYDRAMINE HCL 50 MG/ML IJ SOLN
25.0000 mg | Freq: Three times a day (TID) | INTRAMUSCULAR | Status: DC | PRN
  Administered 2014-10-26 – 2014-10-27 (×2): 25 mg via INTRAVENOUS
  Filled 2014-10-26 (×2): qty 1

## 2014-10-26 MED ORDER — DULOXETINE HCL 60 MG PO CPEP: 60.0000 mg | ORAL_CAPSULE | Freq: Two times a day (BID) | ORAL | Status: DC

## 2014-10-26 MED ORDER — TRAMADOL HCL 50 MG PO TABS: 50.0000 mg | ORAL_TABLET | Freq: Four times a day (QID) | ORAL | Status: DC | PRN

## 2014-10-26 MED ORDER — TOPIRAMATE 25 MG PO TABS
50.0000 mg | ORAL_TABLET | Freq: Every day | ORAL | Status: DC
  Administered 2014-10-26: 50 mg via ORAL
  Filled 2014-10-26: qty 2

## 2014-10-26 MED ORDER — BUTALBITAL-APAP-CAFFEINE 50-325-40 MG PO TABS
1.0000 | ORAL_TABLET | ORAL | Status: DC | PRN
  Administered 2014-10-26 – 2014-10-27 (×5): 1 via ORAL
  Filled 2014-10-26 (×5): qty 1

## 2014-10-26 MED ORDER — PREGABALIN 25 MG PO CAPS
25.0000 mg | ORAL_CAPSULE | Freq: Two times a day (BID) | ORAL | Status: DC
  Administered 2014-10-26 – 2014-10-27 (×2): 25 mg via ORAL
  Filled 2014-10-26 (×2): qty 1

## 2014-10-26 MED ORDER — FLUTICASONE-SALMETEROL 500-50 MCG/DOSE IN AEPB: INHALATION_SPRAY | RESPIRATORY_TRACT | Status: DC

## 2014-10-26 MED ORDER — TRAMADOL HCL 50 MG PO TABS
50.0000 mg | ORAL_TABLET | Freq: Two times a day (BID) | ORAL | Status: DC | PRN
  Administered 2014-10-26: 50 mg via ORAL
  Filled 2014-10-26: qty 1

## 2014-10-26 NOTE — Care Management Note (Signed)
Case Management Note  Patient Details  Name: Diana Benson MRN: [id number] Date of Birth: May 17, 1965  Subjective/Objective:                    Action/Plan: Met with patient to discuss discharge needs. Patient is currently uninsured. She is being followed by Benjamine Mola in [employer]. Patient was previously set up with the [facility] (overflow for [facility]) for her PCP and has an orange card to cover her medications at discharge.  Patient denies any additional discharge needs. Bedside RN updated. Expected Discharge Date:                  Expected Discharge Plan:  Home/Self Care  In-House Referral:  Financial Counselor  Discharge planning Services  CM Consult, [facility], Medication Assistance  Post Acute Care Choice:    Choice offered to:     DME Arranged:    DME Agency:     HH Arranged:    HH Agency:     Status of Service:  Completed, signed off  Medicare Important Message Given:    Date Medicare IM Given:    Medicare IM give by:    Date Additional Medicare IM Given:    Additional Medicare Important Message give by:     If discussed at [place] of Stay Meetings, dates discussed:    Additional Comments:  Rolm Baptise, RN 10/26/2014, 3:01 PM

## 2014-10-26 NOTE — Progress Notes (Signed)
Echocardiogram 2D Echocardiogram has been performed.  Nolon RodBrown, Tony 10/26/2014, 9:32 AM

## 2014-10-26 NOTE — Progress Notes (Signed)
RN discussed discharge instructions with patient, patient verbalized understanding of medications, follow up appointments. Discharge instructions and prescriptions given to patient. Patient waiting for ride, to be escorted by staff to car. IV removed, tele removed. Neuro assessment unchanged, NIHHS unchanged. Patient continues to c/o throbbing headache, MD aware, med administered, will follow up. Will continue to monitor until transportation arrives.

## 2014-10-26 NOTE — Progress Notes (Signed)
STROKE TEAM PROGRESS NOTE   SUBJECTIVE (INTERVAL HISTORY) Her friend/family member  is at the bedside. Overall she feels her condition is improving. She reports she woke yesterday am with her mouth drawn. Said it kept getting worse. She complains of HA that continues today, but better after taking fioricet. She reports recent increased stress at home. MRI and MRA negative for acute stroke or vessel occlusion.   OBJECTIVE Temp:  [97.7 F (36.5 C)-98.3 F (36.8 C)] 97.9 F (36.6 C) (10/25 0957) Pulse Rate:  [59-78] 71 (10/25 0957) Cardiac Rhythm:  [-] Sinus bradycardia (10/25 1100) Resp:  [13-21] 17 (10/25 0957) BP: (90-118)/(54-79) 110/72 mmHg (10/25 0957) SpO2:  [92 %-100 %] 99 % (10/25 0957) Weight:  [73.1 kg (161 lb 2.5 oz)] 73.1 kg (161 lb 2.5 oz) (10/24 2200)  CBC:   Recent Labs Lab 10/25/14 1458 10/26/14 0546  WBC 5.3 5.1  NEUTROABS 2.5 2.1  HGB 11.5* 11.0*  HCT 35.4* 34.0*  MCV 87.6 88.5  PLT 257 249    Basic Metabolic Panel:   Recent Labs Lab 10/25/14 1458 10/26/14 0546  NA 141 136  K 3.6 3.4*  CL 108 103  CO2 26 24  GLUCOSE 81 123*  BUN 7 8  CREATININE 0.60 0.63  CALCIUM 9.1 8.3*    Lipid Panel:     Component Value Date/Time   CHOL 146 10/26/2014 0446   TRIG 143 10/26/2014 0446   HDL 50 10/26/2014 0446   CHOLHDL 2.9 10/26/2014 0446   VLDL 29 10/26/2014 0446   LDLCALC 67 10/26/2014 0446   HgbA1c:  Lab Results  Component Value Date   HGBA1C 5.4 11/25/2013   Urine Drug Screen:     Component Value Date/Time   LABOPIA NONE DETECTED 10/25/2014 2233   COCAINSCRNUR NONE DETECTED 10/25/2014 2233   LABBENZ NONE DETECTED 10/25/2014 2233   AMPHETMU POSITIVE* 10/25/2014 2233   THCU NONE DETECTED 10/25/2014 2233   LABBARB NONE DETECTED 10/25/2014 2233      IMAGING I have personally reviewed the radiological images below and agree with the radiology interpretations.  Dg Chest 2 View 10/25/2014  No active cardiopulmonary disease.   Ct Head Wo  Contrast 10/25/2014   No acute intracranial abnormality.   Mr Maxine GlennMra Head Wo Contrast 10/26/2014  3. Stable and negative intracranial MRA.   Mr Brain Wo Contrast 10/26/2014   1.  No acute intracranial abnormality. 2. Stable nonspecific cerebral white matter and pontine signal changes, most commonly due to chronic small vessel disease. 3.   CUS - Right: 1-39% ICA stenosis. Increased velocities are noted distally and are most likely due to tortuosity. Left: 1-39% ICA stenosis. Increased velocities are noted distally without tortuosity. Cannot not evaluate arterial walls adequately to access for plaque. Vertebral artery flow is antegrade.  2D echo - - Left ventricle: The cavity size was normal. Wall thickness was normal. Systolic function was normal. The estimated ejection fraction was in the range of 55% to 60%. Wall motion was normal; there were no regional wall motion abnormalities. - Right ventricle: The cavity size was mildly decreased.  PHYSICAL EXAM  Temp:  [97.7 F (36.5 C)-98.4 F (36.9 C)] 98.4 F (36.9 C) (10/25 1420) Pulse Rate:  [59-77] 68 (10/25 1420) Resp:  [14-21] 18 (10/25 1420) BP: (90-115)/(54-76) 103/65 mmHg (10/25 1420) SpO2:  [92 %-100 %] 99 % (10/25 1420) Weight:  [161 lb 2.5 oz (73.1 kg)] 161 lb 2.5 oz (73.1 kg) (10/24 2200)  General - Well nourished, well developed, in no  apparent distress.  Ophthalmologic - Fundi not visualized due to photophobia.  Cardiovascular - Regular rate and rhythm with no murmur.  Mental Status -  Level of arousal and orientation to time, place, and person were intact. Language including expression, naming, repetition, comprehension was assessed and found intact. Fund of Knowledge was assessed and was intact.  Cranial Nerves II - XII - II - Visual field intact OU. III, IV, VI - Extraocular movements intact. V - Facial sensation decreased on the right, 50% comparing with left. VII - no significant nasolabial fold  flattening bilaterally. VIII - Hearing & vestibular intact bilaterally. X - Palate elevates symmetrically. XI - Chin turning & shoulder shrug intact bilaterally. XII - Tongue protrusion intact, moving bilaterally.  Motor Strength - The patient's strength exam showed significant giveaway weakness at right UE and LE, hoover's sign positive.  Bulk was normal and fasciculations were absent.   Motor Tone - Muscle tone was assessed at the neck and appendages and was normal.  Reflexes - The patient's reflexes were 1+ in all extremities and she had no pathological reflexes.  Sensory - Light touch, temperature/pinprick decreased on the right, 50% of left.    Coordination - The patient had normal movements in the hands with no ataxia or dysmetria, but slow on action on the right.  Tremor was absent.  Gait and Station - not tested as pt fatigue after walking with PT in hallway.   ASSESSMENT/PLAN [name] is a 49 y.o. female with history of PFO, heavy smoking, reported ischemic stroke in 2008 s/p L ICA angioplasty with no residual deficits, COPD, depression and anxiety, fibromyalgia and attention deficit disorder presenting with HA, right sided weakness, slurred spech. She did not receive IV t-PA due to delay in arrival.   Tension headache due to stress - no acute stroke.   Resultant  Improving HA  MRI  No acute stroke  MRA  Unremarkable   Carotid Doppler  unremarkable   2D Echo  unremarkable   LDL 67  HgbA1c pending  Lovenox 40 mg sq daily for VTE prophylaxis Diet Heart Room service appropriate?: Yes; Fluid consistency:: Thin  aspirin 81 mg daily ordered prior to admission but pt not taking, now on aspirin 81 mg daily.  Patient counseled to be compliant with her antithrombotic medications  Ongoing aggressive stroke risk factor management  Therapy recommendations:  No needs anticipated  Disposition:  Return home  Tobacco abuse  Current smoker  Smoking cessation  counseling provided  Pt is willing to quit  Other Stroke Risk Factors  ETOH use, hx etoh abuse  Hx stroke/TIA  11/2013 L brain TIA  05/2010 [facility] consulted, felt functional  12/2006 reported ischemic stroke 2008 s/p L ICA angioplasty  Family hx stroke (father)  Known PFO  Other Active Problems  ADHD  Anxiety and depression  Fibromyalgia  On [facility]  Hospital day # 1  Neurology will sign off. Please call with questions. No neuro follow up needed at this time. Thanks for the consult.  Marvel Plan, MD PhD Stroke Neurology 10/26/2014 3:39 PM   To contact Stroke Continuity provider, please refer to [email or web address]. After hours, contact General Neurology

## 2014-10-26 NOTE — Progress Notes (Signed)
Continues to have significant headaches-try one dose of depakote, will hold d/c and re-evaluate in am. Resume Lyrica as well.

## 2014-10-26 NOTE — Progress Notes (Signed)
Occupational Therapy Evaluation Patient Details Name: Diana Benson MRN: [id number] DOB: 1965-10-17 Today's Date: 10/26/2014    History of Present Illness Pt is a 49 y.o. female with Past medical history of COPD, ADHD, GERD, CVA, noncompliance, PFO, coronary artery disease. Patient presents with complaints of right-sided facial droop, blurred vision, and right side weakness.    Clinical Impression   Pt admitted with the above diagnoses and presents with below problem list. Pt will benefit from continued acute OT to address the below listed deficits and maximize independence with BADLs prior to d/c home with family assist. PTA pt was independent with ADLs. Pt is currently min guard for LB ADLs; min guard to min A for toilet/tub shower transfers. Pt presents with some RUE deficits though MMT yielded inconsistent findings. Pt also c/o blurriness and reported diplopia in inferior left and right visual fields. Session details below. OT to continue to follow acutely.      Follow Up Recommendations  Supervision/Assistance - 24 hour;Outpatient OT    Equipment Recommendations  3 in 1 bedside comode    Recommendations for Other Services       Precautions / Restrictions Precautions Precautions: Fall Restrictions Weight Bearing Restrictions: No      Mobility Bed Mobility               General bed mobility comments: in recliner  Transfers Overall transfer level: Needs assistance Equipment used: None Transfers: Sit to/from Stand Sit to Stand: Min guard         General transfer comment: in-room functional mobility; min guard for safety    Balance Overall balance assessment: Needs assistance Sitting-balance support: No upper extremity supported;Feet supported Sitting balance-Leahy Scale: Good     Standing balance support: No upper extremity supported;During functional activity Standing balance-Leahy Scale: Fair Standing balance comment: stood to wash hands                             ADL Overall ADL's : Needs assistance/impaired Eating/Feeding: Set up;Sitting   Grooming: Min guard;Standing   Upper Body Bathing: Set up;Sitting Upper Body Bathing Details (indicate cue type and reason): extra time and rest breaks Lower Body Bathing: Min guard;Sit to/from stand   Upper Body Dressing : Set up;Sitting   Lower Body Dressing: Min guard;Sit to/from stand   Toilet Transfer: Min guard;Ambulation;RW   Toileting- Architect and Hygiene: Min guard;Sit to/from stand   Tub/ Shower Transfer: Min guard;Minimal assistance;Tub transfer;Ambulation;Rolling walker;3 in 1   Functional mobility during ADLs: Min guard General ADL Comments: Pt completed short distance in-room functional mobility. Pt had just finished ambualting in the hall with PT using a cane. Pt reported feeling fatigued at start of session  and that the amount of walking she just did would not normally fatigue her. Pt presents with some RUE deficits though findings were inconsistent. Educated on RUE fine motor and general strengthening exercises in "pain-free" zone.      Vision Vision Assessment?: Yes Eye Alignment: Within Functional Limits Ocular Range of Motion: Other (comment) (cues needed to track upper left quadrant) Alignment/Gaze Preference: Within Defined Limits Tracking/Visual Pursuits: Decreased smoothness of eye movement to LEFT superior field;Requires cues, head turns, or add eye shifts to track Saccades: Decreased speed of saccadic movement Visual Fields: No apparent deficits Diplopia Assessment: Other (comment) (pt reports in inferior left and right fields) Additional Comments: Pt reports blurriness of vision near and distance. Pt initially removing glassess and reporting "they don't  do much for me" With no glassess on pt reports blurriness is worse with right eye occulded. With glasses on, pt reports blurriness is worse with left eye occulded.    Perception      Praxis      Pertinent Vitals/Pain Pain Assessment: 0-10 Pain Score: 8  Pain Location: headache Pain Descriptors / Indicators: Headache Pain Intervention(s): Limited activity within patient's tolerance;Monitored during session;Repositioned     Hand Dominance Right   Extremity/Trunk Assessment Upper Extremity Assessment Upper Extremity Assessment: RUE deficits/detail RUE Deficits / Details: Inconsistent findings. WFL AROM. Variable effort noted during MMT with strength ranging from 3 to 4/5. Able to open containers with right hand with some extra time and effort. Reports impaired sensation on RUE. Pain with B shoulder flexion at/>80 degreesPt reports h/o rotator cuff injuries in BUE.  RUE: Unable to fully assess due to pain (rotator cuff pain at baseline) RUE Sensation: decreased light touch RUE Coordination: decreased fine motor   Lower Extremity Assessment Lower Extremity Assessment: Defer to PT evaluation       Communication Communication Communication: No difficulties   Cognition Arousal/Alertness: Awake/alert (eyes closed at times) Behavior [facility] tasks assessed/performed Overall Cognitive Status: No family/caregiver present to determine baseline cognitive functioning                     General Comments       Exercises       Shoulder Instructions      Home Living Family/patient expects to be discharged to:: Private residence Living Arrangements: Parent Available Help at Discharge: Family;Available 24 hours/day Type of Home: Apartment Home Access: Level entry     Home Layout: One level     Bathroom Shower/Tub: Tub/shower unit Shower/tub characteristics: Engineer, building servicesCurtain Bathroom Toilet: Standard     Home Equipment: Grab bars - tub/shower;Grab bars - toilet          Prior Functioning/Environment Level of Independence: Independent             OT Diagnosis: Paresis;Disturbance of vision   OT Problem List: Decreased  strength;Decreased activity tolerance;Impaired balance (sitting and/or standing);Impaired vision/perception;Decreased coordination;Decreased knowledge of use of DME or AE;Decreased knowledge of precautions;Impaired sensation;Impaired UE functional use   OT Treatment/Interventions: Self-care/ADL training;Therapeutic exercise;DME and/or AE instruction;Therapeutic activities;Cognitive remediation/compensation;Visual/perceptual remediation/compensation;Patient/family education;Balance training    OT Goals(Current goals can be found in the care plan section) Acute Rehab OT Goals Patient Stated Goal: not stated OT Goal Formulation: With patient Time For Goal Achievement: 11/02/14 Potential to Achieve Goals: Good ADL Goals Pt Will Perform Grooming: with modified independence;standing Pt Will Transfer to Toilet: with modified independence;ambulating Pt Will Perform Tub/Shower Transfer: Tub transfer;with modified independence;ambulating;3 in 1 Pt/caregiver will Perform Home Exercise Program: Increased strength;Right Upper extremity;With written HEP provided  OT Frequency: Min 2X/week   Barriers to D/C:            Co-evaluation              End of Session    Activity Tolerance: Patient tolerated treatment well Patient left: in chair;with call bell/phone within reach;with chair alarm set   Time: 1145-1210 OT Time Calculation (min): 25 min Charges:  OT General Charges $OT Visit: 1 Procedure OT Evaluation $Initial OT Evaluation Tier I: 1 Procedure OT Treatments $Self Care/Home Management : 8-22 mins G-Codes: OT G-codes **NOT FOR INPATIENT CLASS** Functional Assessment Tool Used: clinical judgement Functional Limitation: Self care Self Care Current Status ([id number]): At least 1 percent but less than 20  percent impaired, limited or restricted Self Care Goal Status (Z6109): At least 1 percent but less than 20 percent impaired, limited or restricted  Pilar Grammes 10/26/2014, 12:46  PM

## 2014-10-26 NOTE — Evaluation (Signed)
SLP Cancellation Note  Patient Details Name: Marya Amslerraci Rutigliano MRN: [id number] DOB: 03-23-1965   Cancelled treatment:       Reason Eval/Treat Not Completed: Patient at procedure or test/unavailable (pt at MRI )   Mills KollerKimball, Sowmya Partridge Ann Lailah Marcelli, MS [facility] SLP [phone]

## 2014-10-26 NOTE — Progress Notes (Signed)
*  PRELIMINARY RESULTS* Vascular Ultrasound Carotid Duplex (Doppler) has been completed.  Preliminary findings: Right:  1-39% ICA stenosis.  Increased velocities are noted distally and are most likely due to tortuosity. Left: 1-39% ICA stenosis. Increased velocities are noted distally without tortuosity. Cannot not evaluate arterial walls adequately to access for plaque. Possibly 40-59% stenosis distal ICA.  Vertebral artery flow is antegrade.      Farrel DemarkJill Eunice, RDMS, RVT  10/26/2014, 10:43 AM

## 2014-10-26 NOTE — Evaluation (Signed)
Physical Therapy Evaluation Patient Details Name: Diana Benson MRN: [id number] DOB: 1965/09/22 Today's Date: 10/26/2014   History of Present Illness  Pt is a 49 y.o. female with Past medical history of COPD, ADHD, GERD, CVA, noncompliance, PFO, coronary artery disease. Patient presents with complaints of right-sided facial droop, blurred vision, and right side weakness.   Clinical Impression  Pt admitted with above diagnosis. Pt currently with functional limitations due to the deficits listed below (see PT Problem List). At the time of PT eval pt was able to perform transfers and ambulation with hands-on min guard assist and SPC for support. Noted continued R-sided weakness, however MMT was inconsistent. Pt will benefit from skilled PT to increase their independence and safety with mobility to allow discharge to the venue listed below.      Follow Up Recommendations Home health PT;Supervision for mobility/OOB    Equipment Recommendations  Cane;3in1 (PT)    Recommendations for Other Services       Precautions / Restrictions Precautions Precautions: Fall Restrictions Weight Bearing Restrictions: No      Mobility  Bed Mobility Overal bed mobility: Modified Independent Bed Mobility: Supine to Sit           General bed mobility comments: No assistance required.   Transfers Overall transfer level: Needs assistance Equipment used: None Transfers: Sit to/from Stand Sit to Stand: Min guard         General transfer comment: Min guard for safety. Pt stood EOB to make sure she did not get dizzy prior to initiating ambulation.   Ambulation/Gait Ambulation/Gait assistance: Min guard Ambulation Distance (Feet): 200 Feet Assistive device: Straight cane Gait Pattern/deviations: Step-through pattern;Decreased stride length;Trunk flexed;Narrow base of support Gait velocity: Decreased Gait velocity interpretation: Below normal speed for age/gender General Gait Details: Very narrow  BOS with short steps. Was able to improve DF/heel strike on R with cues but not able to maintain. Improved with use of the SPC. Initially, required VC's for sequencing with the cane.   Stairs            Wheelchair Mobility    Modified Rankin (Stroke Patients Only) Modified Rankin (Stroke Patients Only) Pre-Morbid Rankin Score: No symptoms Modified Rankin: Moderately severe disability     Balance Overall balance assessment: Needs assistance Sitting-balance support: Feet supported;No upper extremity supported Sitting balance-Leahy Scale: Good     Standing balance support: No upper extremity supported;During functional activity Standing balance-Leahy Scale: Fair Standing balance comment: stood to wash hands                             Pertinent Vitals/Pain Pain Assessment: No/denies pain Pain Score: 8  Pain Location: headache Pain Descriptors / Indicators: Headache Pain Intervention(s): Limited activity within patient's tolerance;Monitored during session;Repositioned    Home Living Family/patient expects to be discharged to:: Private residence Living Arrangements: Parent Available Help at Discharge: Family;Available 24 hours/day Type of Home: Apartment Home Access: Level entry     Home Layout: One level Home Equipment: Grab bars - tub/shower;Grab bars - toilet      Prior Function Level of Independence: Independent         Comments: Was doing all cooking and cleaning at home, all grocery shopping, PTA     Hand Dominance   Dominant Hand: Right    Extremity/Trunk Assessment   Upper Extremity Assessment: Defer to OT evaluation RUE Deficits / Details: Inconsistent findings. WFL AROM. Variable effort noted during MMT with strength  ranging from 3 to 4/5. Able to open containers with right hand with some extra time and effort. Reports impaired sensation on RUE. Pain with B shoulder flexion at/>80 degreesPt reports h/o rotator cuff injuries in BUE.   RUE: Unable to fully assess due to pain (rotator cuff pain at baseline) RUE Sensation: decreased light touch     Lower Extremity Assessment: RLE deficits/detail RLE Deficits / Details: Notable inconsistencies with strength testing. During MMT, muscles appeared jerky when resistance was applied.     Cervical / Trunk Assessment: Normal  Communication   Communication: No difficulties  Cognition Arousal/Alertness: Awake/alert Behavior During Therapy: WFL for tasks assessed/performed Overall Cognitive Status: No family/caregiver present to determine baseline cognitive functioning                      General Comments      Exercises        Assessment/Plan    PT Assessment Patient needs continued PT services  PT Diagnosis Difficulty walking;Hemiplegia dominant side   PT Problem List Decreased strength;Decreased range of motion;Decreased activity tolerance;Decreased balance;Decreased coordination;Decreased mobility;Decreased knowledge of use of DME;Decreased safety awareness;Decreased knowledge of precautions  PT Treatment Interventions DME instruction;Gait training;Stair training;Therapeutic activities;Functional mobility training;Therapeutic exercise;Neuromuscular re-education;Patient/family education   PT Goals (Current goals can be found in the Care Plan section) Acute Rehab PT Goals Patient Stated Goal: not stated PT Goal Formulation: With patient Time For Goal Achievement: 11/02/14 Potential to Achieve Goals: Good    Frequency Min 4X/week   Barriers to discharge        Co-evaluation               End of Session Equipment Utilized During Treatment: Gait belt Activity Tolerance: Patient limited by fatigue Patient left: in chair;with call bell/phone within reach;with chair alarm set Nurse Communication: Mobility status    Functional Assessment Tool Used: Clinical judgement Functional Limitation: Mobility: Walking and moving around Mobility: Walking  and Moving Around Current Status [phone]): At least 1 percent but less than 20 percent impaired, limited or restricted Mobility: Walking and Moving Around Goal Status [phone]): At least 1 percent but less than 20 percent impaired, limited or restricted    Time: 1112-1140 PT Time Calculation (min) (ACUTE ONLY): 28 min   Charges:   PT Evaluation $Initial PT Evaluation Tier I: 1 Procedure PT Treatments $Gait Training: 8-22 mins   PT G Codes:   PT G-Codes **NOT FOR INPATIENT CLASS** Functional Assessment Tool Used: Clinical judgement Functional Limitation: Mobility: Walking and moving around Mobility: Walking and Moving Around Current Status (U9811): At least 1 percent but less than 20 percent impaired, limited or restricted Mobility: Walking and Moving Around Goal Status [phone]): At least 1 percent but less than 20 percent impaired, limited or restricted    Conni Slipper 10/26/2014, 1:53 PM   Conni Slipper, PT, DPT Acute Rehabilitation Services Pager: [phone]

## 2014-10-26 NOTE — Discharge Summary (Addendum)
PATIENT DETAILS Name: Diana Benson Age: 49 y.o. Sex: female Date of Birth: 1965/02/19 MRN: [id number]. Admitting Physician: Rolly Salter, MD WUJ:[id number],WGNFAO, PA-C  Admit Date: 10/25/2014 Discharge date: 10/27/2014  Recommendations for Outpatient Follow-up:  1. Please refer to psychiatry 2. Please follow final results of carotid Doppler-preliminary results do not show any significant stenosis  PRIMARY DISCHARGE DIAGNOSIS:  Principal Problem:   TIA (transient ischemic attack) Active Problems:   ADHD (attention deficit hyperactivity disorder)   Coronary artery calcification seen on CAT scan   Tobacco use disorder, continuous   Headache      PAST MEDICAL HISTORY: Past Medical History  Diagnosis Date  . COPD (chronic obstructive pulmonary disease) with chronic bronchitis (HCC)   . CVA (cerebral vascular accident) [facility]) December 2008    Status post left-sided CAE  . GERD (gastroesophageal reflux disease)   . Asthma   . ADHD (attention deficit hyperactivity disorder)   . Anxiety   . Depression   . Fibromyalgia   . Tobacco use disorder, continuous 01/09/2013  . PFO (patent foramen ovale) 01/09/2013  . Coronary artery calcification seen on CAT scan 01/09/2013    DISCHARGE MEDICATIONS: Current Discharge Medication List    START taking these medications   Details  butalbital-acetaminophen-caffeine (FIORICET, ESGIC) 50-325-40 MG tablet Take 1 tablet by mouth every 6 (six) hours as needed for headache. Qty: 14 tablet, Refills: 0    pregabalin (LYRICA) 25 MG capsule Take 1 capsule (25 mg total) by mouth 2 (two) times daily. Qty: 60 capsule, Refills: 0      CONTINUE these medications which have CHANGED   Details  albuterol (PROAIR HFA) 108 (90 BASE) MCG/ACT inhaler Inhale 2 puffs into the lungs every 4 (four) hours as needed for wheezing or shortness of breath. Qty: 18 each, Refills: 0   Associated Diagnoses: Asthma    ALPRAZolam (XANAX) 1 MG tablet Take 1 tablet (1 mg  total) by mouth 3 (three) times daily as needed. Qty: 15 tablet, Refills: 0    aspirin EC 81 MG EC tablet Take 1 tablet (81 mg total) by mouth daily.    DULoxetine (CYMBALTA) 60 MG capsule Take 1 capsule (60 mg total) by mouth 2 (two) times daily. PATIENT NEEDS OFFICE VISIT FOR ADDITIONAL REFILLS Qty: 30 capsule, Refills: 0    esomeprazole (NEXIUM) 40 MG capsule Take 1 capsule (40 mg total) by mouth daily at 12 noon. Okay to substitute with another brand at equivalent strength Qty: 60 capsule, Refills: 0   Associated Diagnoses: GERD (gastroesophageal reflux disease)    Fluticasone-Salmeterol (ADVAIR DISKUS) 500-50 MCG/DOSE AEPB INHALE 1 PUFF INTO THE LUNGS 2 TIMES DAILY  "OFFICE NEEDED FOR ADDITIONAL REFILLS" Qty: 60 each, Refills: 0      STOP taking these medications     CALCIUM PO      valACYclovir (VALTREX) 1000 MG tablet      albuterol (PROVENTIL) (5 MG/ML) 0.5% nebulizer solution      amphetamine-dextroamphetamine (ADDERALL XR) 30 MG 24 hr capsule      estrogen-methylTESTOSTERone (EST ESTROGENS-METHYLTEST HS) 0.625-1.25 MG per tablet      fluticasone (FLONASE) 50 MCG/ACT nasal spray      guaiFENesin-dextromethorphan (ROBITUSSIN DM) 100-10 MG/5ML syrup      HYDROcodone-acetaminophen (NORCO/VICODIN) 5-325 MG per tablet      ibuprofen (ADVIL,MOTRIN) 600 MG tablet      montelukast (SINGULAIR) 10 MG tablet      nicotine (NICODERM CQ) 7 mg/24hr patch      ondansetron (ZOFRAN) 4 MG  tablet      zolpidem (AMBIEN) 10 MG tablet         ALLERGIES:   Allergies  Allergen Reactions  . Codeine Nausea And Vomiting  . Flexeril [Cyclobenzaprine] Other (See Comments)    "Makes me feel like I'm coming out of my skin."  . Gabapentin Swelling  . Morphine And Related Nausea And Vomiting  . Venlafaxine Nausea Only    BRIEF HPI:  See H&P, Labs, Consult and Test reports for all details in brief, patient was admitted for evaluation of severe headache and numbness to the right  side of her face, right upper/lower extremities  CONSULTATIONS:   neurology  PERTINENT RADIOLOGIC STUDIES: Dg Chest 2 View  10/25/2014  CLINICAL DATA:  Coughing.  Headache and slurred speech. EXAM: CHEST  2 VIEW COMPARISON:  11/24/2013 FINDINGS: The heart size and mediastinal contours are within normal limits. Both lungs are clear. The visualized skeletal structures are unremarkable. IMPRESSION: No active cardiopulmonary disease. Electronically Signed   By: Richarda Overlie M.D.   On: 10/25/2014 21:46   Ct Head Wo Contrast  10/25/2014  CLINICAL DATA:  Slurred speech, right side facial droop EXAM: CT HEAD WITHOUT CONTRAST TECHNIQUE: Contiguous axial images were obtained from the base of the skull through the vertex without intravenous contrast. COMPARISON:  11/24/2013 FINDINGS: No skull fracture is noted. Paranasal sinuses and mastoid air cells are unremarkable. No intracranial hemorrhage, mass effect or midline shift. No acute cortical infarction. No hydrocephalus. No mass lesion is noted on this unenhanced scan. The gray and white-matter differentiation is preserved. No intra or extra-axial fluid collection. IMPRESSION: No acute intracranial abnormality. Electronically Signed   By: Natasha Mead M.D.   On: 10/25/2014 16:28   Mr Maxine Glenn Head Wo Contrast  10/26/2014  CLINICAL DATA:  49 year old female with right facial droop upon waking. Initial encounter. EXAM: MRI HEAD WITHOUT CONTRAST MRA HEAD WITHOUT CONTRAST TECHNIQUE: Multiplanar, multiecho pulse sequences of the brain and surrounding structures were obtained without intravenous contrast. Angiographic images of the head were obtained using MRA technique without contrast. COMPARISON:  Head CT without contrast 10/25/2014. Brain MRI and intracranial MRA 11/25/2013. FINDINGS: MRI HEAD FINDINGS Major intracranial vascular flow voids are stable. No restricted diffusion to suggest acute infarction. No midline shift, mass effect, evidence of mass lesion,  ventriculomegaly, extra-axial collection or acute intracranial hemorrhage. Cervicomedullary junction and pituitary are within normal limits. Mild patchy in scattered nonspecific cerebral white matter T2 and FLAIR hyperintensity is stable since 2015. Deep gray matter nuclei remain normal. Patchy T2 hyperintensity in the pons is stable. Cerebellum remains normal. No chronic cerebral blood products or cortical encephalomalacia identified. Visible internal auditory structures appear normal. Mastoids are clear. Mild paranasal sinus mucosal thickening. Negative orbit and scalp soft tissues. Negative visualized cervical spine. Normal bone marrow signal. MRA HEAD FINDINGS Stable antegrade flow in the posterior circulation with codominant distal vertebral arteries. Normal right PICA origin. Dominant appearing left AICA. Normal basilar artery, SCA, and PCA origins. Tortuous left P1 segment again noted. Still, the left posterior communicating artery is diminutive or absent. Bilateral PCA branches are stable and within normal limits. Stable antegrade flow in both ICA siphons. Normal ophthalmic artery origins. No siphon stenosis. Normal carotid termini, MCA and ACA origins. Anterior communicating artery and visualized ACA branches are within normal limits. Bilateral MCA M1 segments, bifurcations, and visualized bilateral MCA branches are within normal limits. IMPRESSION: 1.  No acute intracranial abnormality. 2. Stable nonspecific cerebral white matter and pontine signal changes, most commonly  due to chronic small vessel disease. 3. Stable and negative intracranial MRA. Electronically Signed   By: Odessa Fleming M.D.   On: 10/26/2014 08:45   Mr Brain Wo Contrast  10/26/2014  CLINICAL DATA:  49 year old female with right facial droop upon waking. Initial encounter. EXAM: MRI HEAD WITHOUT CONTRAST MRA HEAD WITHOUT CONTRAST TECHNIQUE: Multiplanar, multiecho pulse sequences of the brain and surrounding structures were obtained without  intravenous contrast. Angiographic images of the head were obtained using MRA technique without contrast. COMPARISON:  Head CT without contrast 10/25/2014. Brain MRI and intracranial MRA 11/25/2013. FINDINGS: MRI HEAD FINDINGS Major intracranial vascular flow voids are stable. No restricted diffusion to suggest acute infarction. No midline shift, mass effect, evidence of mass lesion, ventriculomegaly, extra-axial collection or acute intracranial hemorrhage. Cervicomedullary junction and pituitary are within normal limits. Mild patchy in scattered nonspecific cerebral white matter T2 and FLAIR hyperintensity is stable since 2015. Deep gray matter nuclei remain normal. Patchy T2 hyperintensity in the pons is stable. Cerebellum remains normal. No chronic cerebral blood products or cortical encephalomalacia identified. Visible internal auditory structures appear normal. Mastoids are clear. Mild paranasal sinus mucosal thickening. Negative orbit and scalp soft tissues. Negative visualized cervical spine. Normal bone marrow signal. MRA HEAD FINDINGS Stable antegrade flow in the posterior circulation with codominant distal vertebral arteries. Normal right PICA origin. Dominant appearing left AICA. Normal basilar artery, SCA, and PCA origins. Tortuous left P1 segment again noted. Still, the left posterior communicating artery is diminutive or absent. Bilateral PCA branches are stable and within normal limits. Stable antegrade flow in both ICA siphons. Normal ophthalmic artery origins. No siphon stenosis. Normal carotid termini, MCA and ACA origins. Anterior communicating artery and visualized ACA branches are within normal limits. Bilateral MCA M1 segments, bifurcations, and visualized bilateral MCA branches are within normal limits. IMPRESSION: 1.  No acute intracranial abnormality. 2. Stable nonspecific cerebral white matter and pontine signal changes, most commonly due to chronic small vessel disease. 3. Stable and  negative intracranial MRA. Electronically Signed   By: Odessa Fleming M.D.   On: 10/26/2014 08:45     PERTINENT LAB RESULTS: CBC:  Recent Labs  10/25/14 1458 10/26/14 0546  WBC 5.3 5.1  HGB 11.5* 11.0*  HCT 35.4* 34.0*  PLT 257 249   CMET CMP     Component Value Date/Time   NA 136 10/26/2014 0546   K 3.4* 10/26/2014 0546   CL 103 10/26/2014 0546   CO2 24 10/26/2014 0546   GLUCOSE 123* 10/26/2014 0546   BUN 8 10/26/2014 0546   CREATININE 0.63 10/26/2014 0546   CALCIUM 8.3* 10/26/2014 0546   PROT 5.6* 10/26/2014 0546   ALBUMIN 3.1* 10/26/2014 0546   AST 24 10/26/2014 0546   ALT 17 10/26/2014 0546   ALKPHOS 57 10/26/2014 0546   BILITOT 0.4 10/26/2014 0546   GFRNONAA >60 10/26/2014 0546   GFRAA >60 10/26/2014 0546    GFR Estimated Creatinine Clearance: 82.4 mL/min (by C-G formula based on Cr of 0.63). No results for input(s): LIPASE, AMYLASE in the last 72 hours. No results for input(s): CKTOTAL, CKMB, CKMBINDEX, TROPONINI in the last 72 hours. Invalid input(s): POCBNP No results for input(s): DDIMER in the last 72 hours.  Recent Labs  10/26/14 0446  HGBA1C 5.4    Recent Labs  10/26/14 0446  CHOL 146  HDL 50  LDLCALC 67  TRIG 143  CHOLHDL 2.9   No results for input(s): TSH, T4TOTAL, T3FREE, THYROIDAB in the last 72 hours.  Invalid  input(s): FREET3 No results for input(s): VITAMINB12, FOLATE, FERRITIN, TIBC, IRON, RETICCTPCT in the last 72 hours. Coags:  Recent Labs  10/25/14 1458  INR 1.06   Microbiology: No results found for this or any previous visit (from the past 240 hour(s)).   BRIEF HOSPITAL COURSE:   Principal Problem: TIA (transient ischemic attack): Speech clear, no longer with facial numbness, inconsistent exam-with very mild right-sided deficits that is not reproduced on repeated exams. Likely some amount of conversion disorder given her psychiatric history. Spoke with neurology-Dr. Xu-since MRI brain negative, Echo without any embolic  source, no significant stenosis in the carotid Doppler-no further workup required inpatient-he recommends discharge. He recommends discharge on ASA 81 mg.  Active Problems: Depression/anxiety/fibromyalgia: Continue Cymbalta and Xanax-patient claims she has not been taking her psychiatry medications for the past 4 months. Have provided patient a supply that she she is a new PCP on 10/31  ADHD (attention deficit hyperactivity disorder): Claims she has not been taking Adderall for the past 4 months-I will defer whether or not to resume Adderall to a PCP-neck appointment on 10/31.  Suspected tension headache: Improved with cocktail of Toradol/Phenergan/Benadryl-continue Lyrica and Cymbalta. Further workup/management deferred to the outpatient setting.   Tobacco abuse: Counseled  TODAY-DAY OF DISCHARGE:  Subjective:   Diana Benson today has no chest abdominal pain,no new weakness tingling or numbness, feels much better wants to go home today.   Objective:   Blood pressure 111/72, pulse 62, temperature 98.5 F (36.9 C), temperature source Oral, resp. rate 20, height 5\' 3"  (1.6 m), weight 73.1 kg (161 lb 2.5 oz), SpO2 96 %.  Intake/Output Summary (Last 24 hours) at 10/27/14 0945 Last data filed at 10/27/14 0404  Gross per 24 hour  Intake    760 ml  Output      7 ml  Net    753 ml   Filed Weights   10/25/14 2200  Weight: 73.1 kg (161 lb 2.5 oz)    Exam Awake Alert, Oriented *3, No new F.N deficits, Normal affect [place].AT,PERRAL Supple Neck,No JVD, No cervical lymphadenopathy appriciated.  Symmetrical Chest wall movement, Good air movement bilaterally, CTAB RRR,No Gallops,Rubs or new Murmurs, No Parasternal Heave +ve B.Sounds, Abd Soft, Non tender, No organomegaly appriciated, No rebound -guarding or rigidity. No Cyanosis, Clubbing or edema, No new Rash or bruise  DISCHARGE CONDITION: Stable  DISPOSITION: Home with home health services  DISCHARGE INSTRUCTIONS:    Activity:  As  tolerated   Get Medicines reviewed and adjusted: Please take all your medications with you for your next visit with your Primary MD  Please request your Primary MD to go over all hospital tests and procedure/radiological results at the follow up, please ask your Primary MD to get all Hospital records sent to his/her office.  If you experience worsening of your admission symptoms, develop shortness of breath, life threatening emergency, suicidal or homicidal thoughts you must seek medical attention immediately by calling 911 or calling your MD immediately  if symptoms less severe.  You must read complete instructions/literature along with all the possible adverse reactions/side effects for all the Medicines you take and that have been prescribed to you. Take any new Medicines after you have completely understood and accpet all the possible adverse reactions/side effects.   Do not drive when taking Pain medications.   Do not take more than prescribed Pain, Sleep and Anxiety Medications  Special Instructions: If you have smoked or chewed Tobacco  in the last 2 yrs please stop  smoking, stop any regular Alcohol  and or any Recreational drug use.  Wear Seat belts while driving.  Please note  You were cared for by a hospitalist during your hospital stay. Once you are discharged, your primary care physician will handle any further medical issues. Please note that NO REFILLS for any discharge medications will be authorized once you are discharged, as it is imperative that you return to your primary care physician (or establish a relationship with a primary care physician if you do not have one) for your aftercare needs so that they can reassess your need for medications and monitor your lab values.   Diet recommendation: Heart Healthy diet  Discharge Instructions    Call MD for:  difficulty breathing, headache or visual disturbances    Complete by:  As directed      Call MD for:  persistant  dizziness or light-headedness    Complete by:  As directed      Call MD for:  persistant nausea and vomiting    Complete by:  As directed      Diet - low sodium heart healthy    Complete by:  As directed      Increase activity slowly    Complete by:  As directed            Follow-up Information    Follow up with Massie Maroon, FNP On 11/01/2014.   Specialty:  Family Medicine   Why:  appt at 2:30 pm   Contact information:   [address] [place] [address] [phone]       Total Time spent on discharge equals 25  minutes.  SignedJeoffrey Massed 10/27/2014 9:45 AM

## 2014-10-26 NOTE — Progress Notes (Signed)
Patient arrived to 5M17 from ED. Pt ambulated from stretcher to bed with minimal assist. Pt alert and oriented x 4. Telemetry applied, oriented to room and call bell, vitals stable. Pt complaining of headache. Md notified and orders received. Will continue to monitor.

## 2014-10-27 LAB — HEMOGLOBIN A1C
Hgb A1c MFr Bld: 5.4 % (ref 4.8–5.6)
MEAN PLASMA GLUCOSE: 108 mg/dL

## 2014-10-27 MED ORDER — PREGABALIN 25 MG PO CAPS: 25.0000 mg | ORAL_CAPSULE | Freq: Two times a day (BID) | ORAL | Status: AC

## 2014-10-27 MED ORDER — BUTALBITAL-APAP-CAFFEINE 50-325-40 MG PO TABS: 1.0000 | ORAL_TABLET | Freq: Four times a day (QID) | ORAL | Status: DC | PRN

## 2014-10-27 NOTE — Progress Notes (Signed)
PATIENT DETAILS Name: Diana Benson Age: 49 y.o. Sex: female Date of Birth: 12-Sep-1965 Admit Date: 10/25/2014 Admitting Physician Rolly Salter, MD QMV:[id number],XBMWUX, PA-C  Subjective: Headache improved.  Assessment/Plan: Principal Problem:   TIA (transient ischemic attack): Not sure if this was TIA or functional issue as right-sided weakness very inconsistent on exam. Workup complete-MRI brain negative, Echo without embolic source, carotid Doppler negative for significant stenosis. Continue aspirin on discharge  Active Problems:  Suspected tension headache: Improved with cocktail of Toradol/Phenergan/Benadryl-continue Lyrica and Cymbalta. Further workup deferred to the outpatient setting.    Disposition:Home today  Antimicrobial agents  See below  Anti-infectives    None      CONSULTS:  neurology  Time spent 15 minutes-Greater than 50% of this time was spent in counseling, explanation of diagnosis, planning of further management, and coordination of care.  MEDICATIONS: Scheduled Meds: . aspirin EC  81 mg Oral Daily  . DULoxetine  60 mg Oral BID  . enoxaparin (LOVENOX) injection  40 mg Subcutaneous Q24H  . pregabalin  25 mg Oral BID  . topiramate  50 mg Oral QHS   Continuous Infusions:  PRN Meds:.acetaminophen, ALPRAZolam, butalbital-acetaminophen-caffeine, ketorolac **AND** metoCLOPramide (REGLAN) injection **AND** diphenhydrAMINE, senna-docusate, traMADol    PHYSICAL EXAM: Vital signs in last 24 hours: Filed Vitals:   10/26/14 1757 10/26/14 2118 10/27/14 0156 10/27/14 0542  BP: 106/63 99/63 109/64 111/72  Pulse: 76 75 66 62  Temp: 97.8 F (36.6 C) 98.3 F (36.8 C) 98.5 F (36.9 C) 98.5 F (36.9 C)  TempSrc: Oral  Oral Oral  Resp: Height:      Weight:      SpO2: 98% 97% 98% 96%    Weight change:  Filed Weights   10/25/14 2200  Weight: 73.1 kg (161 lb 2.5 oz)   Body mass index is 28.55 kg/(m^2).   Gen Exam: Awake  and alert with clear speech.  Neck: Supple, No JVD.   Chest: B/L Clear.  CVS: S1 S2 Regular, no murmurs.  Abdomen: soft, BS +, non tender, non distended.  Extremities: no edema, lower extremities warm to touch. Skin: No Rash.   Wounds: N/A.    Intake/Output from previous day:  Intake/Output Summary (Last 24 hours) at 10/27/14 0942 Last data filed at 10/27/14 0404  Gross per 24 hour  Intake    760 ml  Output      7 ml  Net    753 ml     LAB RESULTS: CBC  Recent Labs Lab 10/25/14 1458 10/26/14 0546  WBC 5.3 5.1  HGB 11.5* 11.0*  HCT 35.4* 34.0*  PLT 257 249  MCV 87.6 88.5  MCH 28.5 28.6  MCHC 32.5 32.4  RDW 15.1 15.2  LYMPHSABS 2.3 2.3  MONOABS 0.4 0.4  EOSABS 0.1 0.3  BASOSABS 0.0 0.0    Chemistries   Recent Labs Lab 10/25/14 1458 10/26/14 0546  NA 141 136  K 3.6 3.4*  CL 108 103  CO2 26 24  GLUCOSE 81 123*  BUN 7 8  CREATININE 0.60 0.63  CALCIUM 9.1 8.3*    CBG: No results for input(s): GLUCAP in the last 168 hours.  GFR Estimated Creatinine Clearance: 82.4 mL/min (by C-G formula based on Cr of 0.63).  Coagulation profile  Recent Labs Lab 10/25/14 1458  INR 1.06    Cardiac Enzymes No results for input(s): CKMB, TROPONINI, MYOGLOBIN in the  last 168 hours.  Invalid input(s): CK  Invalid input(s): POCBNP No results for input(s): DDIMER in the last 72 hours.  Recent Labs  10/26/14 0446  HGBA1C 5.4    Recent Labs  10/26/14 0446  CHOL 146  HDL 50  LDLCALC 67  TRIG 143  CHOLHDL 2.9   No results for input(s): TSH, T4TOTAL, T3FREE, THYROIDAB in the last 72 hours.  Invalid input(s): FREET3 No results for input(s): VITAMINB12, FOLATE, FERRITIN, TIBC, IRON, RETICCTPCT in the last 72 hours. No results for input(s): LIPASE, AMYLASE in the last 72 hours.  Urine Studies No results for input(s): UHGB, CRYS in the last 72 hours.  Invalid input(s): UACOL, UAPR, USPG, UPH, UTP, UGL, UKET, UBIL, UNIT, UROB, ULEU, UEPI, UWBC, URBC,  UBAC, CAST, UCOM, BILUA  MICROBIOLOGY: No results found for this or any previous visit (from the past 240 hour(s)).  RADIOLOGY STUDIES/RESULTS: Dg Chest 2 View  10/25/2014  CLINICAL DATA:  Coughing.  Headache and slurred speech. EXAM: CHEST  2 VIEW COMPARISON:  11/24/2013 FINDINGS: The heart size and mediastinal contours are within normal limits. Both lungs are clear. The visualized skeletal structures are unremarkable. IMPRESSION: No active cardiopulmonary disease. Electronically Signed   By: Richarda Overlie M.D.   On: 10/25/2014 21:46   Ct Head Wo Contrast  10/25/2014  CLINICAL DATA:  Slurred speech, right side facial droop EXAM: CT HEAD WITHOUT CONTRAST TECHNIQUE: Contiguous axial images were obtained from the base of the skull through the vertex without intravenous contrast. COMPARISON:  11/24/2013 FINDINGS: No skull fracture is noted. Paranasal sinuses and mastoid air cells are unremarkable. No intracranial hemorrhage, mass effect or midline shift. No acute cortical infarction. No hydrocephalus. No mass lesion is noted on this unenhanced scan. The gray and white-matter differentiation is preserved. No intra or extra-axial fluid collection. IMPRESSION: No acute intracranial abnormality. Electronically Signed   By: Natasha Mead M.D.   On: 10/25/2014 16:28   Mr Maxine Glenn Head Wo Contrast  10/26/2014  CLINICAL DATA:  49 year old female with right facial droop upon waking. Initial encounter. EXAM: MRI HEAD WITHOUT CONTRAST MRA HEAD WITHOUT CONTRAST TECHNIQUE: Multiplanar, multiecho pulse sequences of the brain and surrounding structures were obtained without intravenous contrast. Angiographic images of the head were obtained using MRA technique without contrast. COMPARISON:  Head CT without contrast 10/25/2014. Brain MRI and intracranial MRA 11/25/2013. FINDINGS: MRI HEAD FINDINGS Major intracranial vascular flow voids are stable. No restricted diffusion to suggest acute infarction. No midline shift, mass effect,  evidence of mass lesion, ventriculomegaly, extra-axial collection or acute intracranial hemorrhage. Cervicomedullary junction and pituitary are within normal limits. Mild patchy in scattered nonspecific cerebral white matter T2 and FLAIR hyperintensity is stable since 2015. Deep gray matter nuclei remain normal. Patchy T2 hyperintensity in the pons is stable. Cerebellum remains normal. No chronic cerebral blood products or cortical encephalomalacia identified. Visible internal auditory structures appear normal. Mastoids are clear. Mild paranasal sinus mucosal thickening. Negative orbit and scalp soft tissues. Negative visualized cervical spine. Normal bone marrow signal. MRA HEAD FINDINGS Stable antegrade flow in the posterior circulation with codominant distal vertebral arteries. Normal right PICA origin. Dominant appearing left AICA. Normal basilar artery, SCA, and PCA origins. Tortuous left P1 segment again noted. Still, the left posterior communicating artery is diminutive or absent. Bilateral PCA branches are stable and within normal limits. Stable antegrade flow in both ICA siphons. Normal ophthalmic artery origins. No siphon stenosis. Normal carotid termini, MCA and ACA origins. Anterior communicating artery and visualized ACA branches are  within normal limits. Bilateral MCA M1 segments, bifurcations, and visualized bilateral MCA branches are within normal limits. IMPRESSION: 1.  No acute intracranial abnormality. 2. Stable nonspecific cerebral white matter and pontine signal changes, most commonly due to chronic small vessel disease. 3. Stable and negative intracranial MRA. Electronically Signed   By: Odessa FlemingH  Hall M.D.   On: 10/26/2014 08:45   Mr Brain Wo Contrast  10/26/2014  CLINICAL DATA:  49 year old female with right facial droop upon waking. Initial encounter. EXAM: MRI HEAD WITHOUT CONTRAST MRA HEAD WITHOUT CONTRAST TECHNIQUE: Multiplanar, multiecho pulse sequences of the brain and surrounding  structures were obtained without intravenous contrast. Angiographic images of the head were obtained using MRA technique without contrast. COMPARISON:  Head CT without contrast 10/25/2014. Brain MRI and intracranial MRA 11/25/2013. FINDINGS: MRI HEAD FINDINGS Major intracranial vascular flow voids are stable. No restricted diffusion to suggest acute infarction. No midline shift, mass effect, evidence of mass lesion, ventriculomegaly, extra-axial collection or acute intracranial hemorrhage. Cervicomedullary junction and pituitary are within normal limits. Mild patchy in scattered nonspecific cerebral white matter T2 and FLAIR hyperintensity is stable since 2015. Deep gray matter nuclei remain normal. Patchy T2 hyperintensity in the pons is stable. Cerebellum remains normal. No chronic cerebral blood products or cortical encephalomalacia identified. Visible internal auditory structures appear normal. Mastoids are clear. Mild paranasal sinus mucosal thickening. Negative orbit and scalp soft tissues. Negative visualized cervical spine. Normal bone marrow signal. MRA HEAD FINDINGS Stable antegrade flow in the posterior circulation with codominant distal vertebral arteries. Normal right PICA origin. Dominant appearing left AICA. Normal basilar artery, SCA, and PCA origins. Tortuous left P1 segment again noted. Still, the left posterior communicating artery is diminutive or absent. Bilateral PCA branches are stable and within normal limits. Stable antegrade flow in both ICA siphons. Normal ophthalmic artery origins. No siphon stenosis. Normal carotid termini, MCA and ACA origins. Anterior communicating artery and visualized ACA branches are within normal limits. Bilateral MCA M1 segments, bifurcations, and visualized bilateral MCA branches are within normal limits. IMPRESSION: 1.  No acute intracranial abnormality. 2. Stable nonspecific cerebral white matter and pontine signal changes, most commonly due to chronic small  vessel disease. 3. Stable and negative intracranial MRA. Electronically Signed   By: Odessa FlemingH  Hall M.D.   On: 10/26/2014 08:45    Jeoffrey MassedGHIMIRE,SHANKER, MD  [facility] Pager:[phone]  If 7PM-7AM, please contact night-coverage [email or web address] Password [email or web address] 10/27/2014, 9:42 AM

## 2014-10-27 NOTE — Progress Notes (Signed)
SLP Cancellation Note  Patient Details Name: Diana Benson MRN: [id number] DOB: 10-04-1965   Cancelled treatment:        MD stated pt is being discharged today and does not need speech-language-cognitive assessment.  Diana Benson, Diana Benson 10/27/2014, 1:16 PM

## 2014-10-27 NOTE — Progress Notes (Signed)
Physical Therapy Treatment Patient Details Name: Diana Benson MRN: [id number] DOB: April 11, 1965 Today's Date: 10/27/2014    History of Present Illness Pt is a 49 y.o. female with Past medical history of COPD, ADHD, GERD, CVA, noncompliance, PFO, coronary artery disease. Patient presents with complaints of right-sided facial droop, blurred vision, and right side weakness.     PT Comments    Pt progressing towards physical therapy goals. Demonstr[facility], and was able to negotiate 3 steps with min assist. Pt anticipates d/c today. [facility]. Will continue to follow and progress as able per POC.   Follow Up Recommendations  Home health PT;Supervision for mobility/OOB     Equipment Recommendations  Cane;3in1 (PT)    Recommendations for Other Services       Precautions / Restrictions Precautions Precautions: Fall Restrictions Weight Bearing Restrictions: No    Mobility  Bed Mobility Overal bed mobility: Modified Independent Bed Mobility: Supine to Sit;Sit to Supine           General bed mobility comments: No assistance required.   Transfers Overall transfer level: Needs assistance Equipment used: Straight cane Transfers: Sit to/from Stand Sit to Stand: Min guard         General transfer comment: Close guard for safety.   Ambulation/Gait Ambulation/Gait assistance: Min guard Ambulation Distance (Feet): 200 Feet Assistive device: Straight cane Gait Pattern/deviations: Step-through pattern;Decreased stride length;Trunk flexed Gait velocity: Decreased Gait velocity interpretation: Below normal speed for age/gender General Gait Details: Continues to demonstrate narrow BOS and descreased step/stride length. Appeared to be putting >50% of weight through cane on L side, but overall fluidity of movement appeared improved from prior session.    Stairs Stairs: Yes Stairs assistance: Min assist Stair  Management: No rails;One rail Right;Step to pattern;Forwards Number of Stairs: 3 (1 stair x3 ) General stair comments: VC's for sequencing and technique with weaker RLE. Pt was able to negotiate 1 step x3 times with min assist for balance and support.   Wheelchair Mobility    Modified Rankin (Stroke Patients Only) Modified Rankin (Stroke Patients Only) Pre-Morbid Rankin Score: No symptoms Modified Rankin: Moderately severe disability     Balance Overall balance assessment: Needs assistance Sitting-balance support: Feet supported;No upper extremity supported Sitting balance-Leahy Scale: Good     Standing balance support: No upper extremity supported;During functional activity Standing balance-Leahy Scale: Fair                      Cognition Arousal/Alertness: Awake/alert Behavior During Therapy: WFL for tasks assessed/performed Overall Cognitive Status: No family/caregiver present to determine baseline cognitive functioning (Likely at baseline)                      Exercises      General Comments        Pertinent Vitals/Pain Pain Assessment: 0-10 Pain Score: 6  Pain Location: Headache Pain Intervention(s): Limited activity within patient's tolerance;Monitored during session;Repositioned    Home Living                      Prior Function            PT Goals (current goals can now be found in the care plan section) Acute Rehab PT Goals Patient Stated Goal: not stated PT Goal Formulation: With patient Time For Goal Achievement: 11/02/14 Potential to Achieve Goals: Good Progress towards PT goals: Progressing toward goals  Frequency  Min 4X/week    PT Plan Current plan remains appropriate    Co-evaluation             End of Session Equipment Utilized During Treatment: Gait belt Activity Tolerance: Patient limited by fatigue Patient left: in bed;with call bell/phone within reach     Time: 0917-0931 PT Time Calculation  (min) (ACUTE ONLY): 14 min  Charges:  $Gait Training: 8-22 mins                    G Codes:      Conni Slipper 2014-11-05, 11:11 AM  Conni Slipper, PT, DPT Acute Rehabilitation Services Pager: [phone]

## 2014-10-27 NOTE — Progress Notes (Addendum)
Patient escorted down via wheelchair with staff. Neuro assessment unchanged. Patient complains of "6/10" headache, MD aware.

## 2014-10-27 NOTE — Discharge Instructions (Signed)
'Migraine Headache A migraine headache is an intense, throbbing pain on one or both sides of your head. A migraine can last for 30 minutes to several hours. CAUSES  The exact cause of a migraine headache is not always known. However, a migraine may be caused when nerves in the brain become irritated and release chemicals that cause inflammation. This causes pain. Certain things may also trigger migraines, such as:  Alcohol.  Smoking.  Stress.  Menstruation.  Aged cheeses.  Foods or drinks that contain nitrates, glutamate, aspartame, or tyramine.  Lack of sleep.  Chocolate.  Caffeine.  Hunger.  Physical exertion.  Fatigue.  Medicines used to treat chest pain (nitroglycerine), birth control pills, estrogen, and some blood pressure medicines. SIGNS AND SYMPTOMS  Pain on one or both sides of your head.  Pulsating or throbbing pain.  Severe pain that prevents daily activities.  Pain that is aggravated by any physical activity.  Nausea, vomiting, or both.  Dizziness.  Pain with exposure to bright lights, loud noises, or activity.  General sensitivity to bright lights, loud noises, or smells. Before you get a migraine, you may get warning signs that a migraine is coming (aura). An aura may include:  Seeing flashing lights.  Seeing bright spots, halos, or zigzag lines.  Having tunnel vision or blurred vision.  Having feelings of numbness or tingling.  Having trouble talking.  Having muscle weakness. DIAGNOSIS  A migraine headache is often diagnosed based on:  Symptoms.  Physical exam.  A CT scan or MRI of your head. These imaging tests cannot diagnose migraines, but they can help rule out other causes of headaches. TREATMENT Medicines may be given for pain and nausea. Medicines can also be given to help prevent recurrent migraines.  HOME CARE INSTRUCTIONS  Only take over-the-counter or prescription medicines for pain or discomfort as directed by your  health care provider. The use of long-term narcotics is not recommended.  Lie down in a dark, quiet room when you have a migraine.  Keep a journal to find out what may trigger your migraine headaches. For example, write down:  What you eat and drink.  How much sleep you get.  Any change to your diet or medicines.  Limit alcohol consumption.  Quit smoking if you smoke.  Get 7-9 hours of sleep, or as recommended by your health care provider.  Limit stress.  Keep lights dim if bright lights bother you and make your migraines worse. SEEK IMMEDIATE MEDICAL CARE IF:   Your migraine becomes severe.  You have a fever.  You have a stiff neck.  You have vision loss.  You have muscular weakness or loss of muscle control.  You start losing your balance or have trouble walking.  You feel faint or pass out.  You have severe symptoms that are different from your first symptoms. MAKE SURE YOU:   Understand these instructions.  Will watch your condition.  Will get help right away if you are not doing well or get worse.   This information is not intended to replace advice given to you by your health care provider. Make sure you discuss any questions you have with your health care provider.   Document Released: 12/18/2004 Document Revised: 01/08/2014 Document Reviewed: 08/25/2012 Elsevier Interactive Patient Education 2016 [employer]  Transient Ischemic Attack A transient ischemic attack (TIA) is a "warning stroke" that causes stroke-like symptoms. Unlike a stroke, a TIA does not cause permanent damage to the brain. The symptoms of a TIA  can happen very fast and do not last long. It is important to know the symptoms of a TIA and what to do. This can help prevent a major stroke or death. CAUSES  A TIA is caused by a temporary blockage in an artery in the brain or neck (carotid artery). The blockage does not allow the brain to get the blood supply it needs and can cause different  symptoms. The blockage can be caused by either:  A blood clot.  Fatty buildup (plaque) in a neck or brain artery. RISK FACTORS  High blood pressure (hypertension).  High cholesterol.  Diabetes mellitus.  Heart disease.  The buildup of plaque in the blood vessels (peripheral artery disease or atherosclerosis).  The buildup of plaque in the blood vessels that provide blood and oxygen to the brain (carotid artery stenosis).  An abnormal heart rhythm (atrial fibrillation).  Obesity.  Using any tobacco products, including cigarettes, chewing tobacco, or electronic cigarettes.  Taking oral contraceptives, especially in combination with using tobacco.  Physical inactivity.  A diet high in fats, salt (sodium), and calories.  Excessive alcohol use.  Use of illegal drugs (especially cocaine and methamphetamine).  Being female.  Being African American.  Being over the age of 28 years.  Family history of stroke.  Previous history of blood clots, stroke, TIA, or heart attack.  Sickle cell disease. SIGNS AND SYMPTOMS  TIA symptoms are the same as a stroke but are temporary. These symptoms usually develop suddenly, or may be newly present upon waking from sleep:  Sudden weakness or numbness of the face, arm, or leg, especially on one side of the body.  Sudden trouble walking or difficulty moving arms or legs.  Sudden confusion.  Sudden personality changes.  Trouble speaking (aphasia) or understanding.  Difficulty swallowing.  Sudden trouble seeing in one or both eyes.  Double vision.  Dizziness.  Loss of balance or coordination.  Sudden severe headache with no known cause.  Trouble reading or writing.  Loss of bowel or bladder control.  Loss of consciousness. DIAGNOSIS  Your health care provider may be able to determine the presence or absence of a TIA based on your symptoms, history, and physical exam. CT scan of the brain is usually performed to help  identify a TIA. Other tests may include:  Electrocardiography (ECG).  Continuous heart monitoring.  Echocardiography.  Carotid ultrasonography.  MRI.  A scan of the brain circulation.  Blood tests. TREATMENT  Since the symptoms of TIA are the same as a stroke, it is important to seek treatment as soon as possible. You may need a medicine to dissolve a blood clot (thrombolytic) if that is the cause of the TIA. This medicine cannot be given if too much time has passed. Treatment may also include:   Rest, oxygen, fluids through an IV tube, and medicines to thin the blood (anticoagulants).  Measures will be taken to prevent short-term and long-term complications, including infection from breathing foreign material into the lungs (aspiration pneumonia), blood clots in the legs, and falls.  Procedures to either remove plaque in the carotid arteries or dilate carotid arteries that have narrowed due to plaque. Those procedures are:  Carotid endarterectomy.  Carotid angioplasty and stenting.  Medicines and diet may be used to address diabetes, high blood pressure, and other underlying risk factors. HOME CARE INSTRUCTIONS   Take medicines only as directed by your health care provider. Follow the directions carefully. Medicines may be used to control risk factors for a  stroke. Be sure you understand all your medicine instructions.  You may be told to take aspirin or the anticoagulant warfarin. Warfarin needs to be taken exactly as instructed.  Taking too much or too little warfarin is dangerous. Too much warfarin increases the risk of bleeding. Too little warfarin continues to allow the risk for blood clots. While taking warfarin, you will need to have regular blood tests to measure your blood clotting time. A PT blood test measures how long it takes for blood to clot. Your PT is used to calculate another value called an INR. Your PT and INR help your health care provider to adjust your dose  of warfarin. The dose can change for many reasons. It is critically important that you take warfarin exactly as prescribed.  Many foods, especially foods high in vitamin K can interfere with warfarin and affect the PT and INR. Foods high in vitamin K include spinach, kale, broccoli, cabbage, collard and turnip greens, Brussels sprouts, peas, cauliflower, seaweed, and parsley, as well as beef and pork liver, green tea, and soybean oil. You should eat a consistent amount of foods high in vitamin K. Avoid major changes in your diet, or notify your health care provider before changing your diet. Arrange a visit with a dietitian to answer your questions.  Many medicines can interfere with warfarin and affect the PT and INR. You must tell your health care provider about any and all medicines you take; this includes all vitamins and supplements. Be especially cautious with aspirin and anti-inflammatory medicines. Do not take or discontinue any prescribed or over-the-counter medicine except on the advice of your health care provider or pharmacist.  Warfarin can have side effects, such as excessive bruising or bleeding. You will need to hold pressure over cuts for longer than usual. Your health care provider or pharmacist will discuss other potential side effects.  Avoid sports or activities that may cause injury or bleeding.  Be careful when shaving, flossing your teeth, or handling sharp objects.  Alcohol can change the body's ability to handle warfarin. It is best to avoid alcoholic drinks or consume only very small amounts while taking warfarin. Notify your health care provider if you change your alcohol intake.  Notify your dentist or other health care providers before procedures.  Eat a diet that includes 5 or more servings of fruits and vegetables each day. This may reduce the risk of stroke. Certain diets may be prescribed to address high blood pressure, high cholesterol, diabetes, or obesity.  A  diet low in sodium, saturated fat, trans fat, and cholesterol is recommended to manage high blood pressure.  A diet low in saturated fat, trans fat, and cholesterol, and high in fiber may control cholesterol levels.  A controlled-carbohydrate, controlled-sugar diet is recommended to manage diabetes.  A reduced-calorie diet that is low in sodium, saturated fat, trans fat, and cholesterol is recommended to manage obesity.  Maintain a healthy weight.  Stay physically active. It is recommended that you get at least 30 minutes of activity on most or all days.  Do not use any tobacco products, including cigarettes, chewing tobacco, or electronic cigarettes. If you need help quitting, ask your health care provider.  Limit alcohol intake to no more than 1 drink per day for nonpregnant women and 2 drinks per day for men. One drink equals 12 ounces of beer, 5 ounces of wine, or 1 ounces of hard liquor.  Do not abuse drugs.  A safe home environment is important  to reduce the risk of falls. Your health care provider may arrange for specialists to evaluate your home. Having grab bars in the bedroom and bathroom is often important. Your health care provider may arrange for equipment to be used at home, such as raised toilets and a seat for the shower.  Follow all instructions for follow-up with your health care provider. This is very important. This includes any referrals and lab tests. Proper follow-up can prevent a stroke or another TIA from occurring. PREVENTION  The risk of a TIA can be decreased by appropriately treating high blood pressure, high cholesterol, diabetes, heart disease, and obesity, and by quitting smoking, limiting alcohol, and staying physically active. SEEK MEDICAL CARE IF:  You have personality changes.  You have difficulty swallowing.  You are seeing double.  You have dizziness.  You have a fever. SEEK IMMEDIATE MEDICAL CARE IF:  Any of the following symptoms may  represent a serious problem that is an emergency. Do not wait to see if the symptoms will go away. Get medical help right away. Call your local emergency services (911 in [place] Do not drive yourself to the hospital.  You have sudden weakness or numbness of the face, arm, or leg, especially on one side of the body.  You have sudden trouble walking or difficulty moving arms or legs.  You have sudden confusion.  You have trouble speaking (aphasia) or understanding.  You have sudden trouble seeing in one or both eyes.  You have a loss of balance or coordination.  You have a sudden, severe headache with no known cause.  You have new chest pain or an irregular heartbeat.  You have a partial or total loss of consciousness. MAKE SURE YOU:   Understand these instructions.  Will watch your condition.  Will get help right away if you are not doing well or get worse.   This information is not intended to replace advice given to you by your health care provider. Make sure you discuss any questions you have with your health care provider.   Document Released: 09/27/2004 Document Revised: 01/08/2014 Document Reviewed: 03/25/2013 Elsevier Interactive Patient Education 2016 [employer] Inc.  Pregabalin capsules What is this medicine? PREGABALIN (pre GAB a lin) is used to treat nerve pain from diabetes, shingles, spinal cord injury, and fibromyalgia. It is also used to control seizures in epilepsy. This medicine may be used for other purposes; ask your health care provider or pharmacist if you have questions. What should I tell my health care provider before I take this medicine? They need to know if you have any of these conditions: -bleeding problems -heart disease, including heart failure -history of alcohol or drug abuse -kidney disease -suicidal thoughts, plans, or attempt; a previous suicide attempt by you or a family member -an unusual or allergic reaction to pregabalin, gabapentin,  other medicines, foods, dyes, or preservatives -pregnant or trying to get pregnant or trying to conceive with your partner -breast-feeding How should I use this medicine? Take this medicine by mouth with a glass of water. Follow the directions on the prescription label. You can take this medicine with or without food. Take your doses at regular intervals. Do not take your medicine more often than directed. Do not stop taking except on your doctor's advice. A special MedGuide will be given to you by the pharmacist with each prescription and refill. Be sure to read this information carefully each time. Talk to your pediatrician regarding the use of this medicine in  children. Special care may be needed. Overdosage: If you think you have taken too much of this medicine contact a poison control center or emergency room at once. NOTE: This medicine is only for you. Do not share this medicine with others. What if I miss a dose? If you miss a dose, take it as soon as you can. If it is almost time for your next dose, take only that dose. Do not take double or extra doses. What may interact with this medicine? -alcohol -certain medicines for blood pressure like captopril, enalapril, or lisinopril -certain medicines for diabetes, like pioglitazone or rosiglitazone -certain medicines for anxiety or sleep -narcotic medicines for pain This list may not describe all possible interactions. Give your health care provider a list of all the medicines, herbs, non-prescription drugs, or dietary supplements you use. Also tell them if you smoke, drink alcohol, or use illegal drugs. Some items may interact with your medicine. What should I watch for while using this medicine? Tell your doctor or healthcare professional if your symptoms do not start to get better or if they get worse. Visit your doctor or health care professional for regular checks on your progress. Do not stop taking except on your doctor's advice. You  may develop a severe reaction. Your doctor will tell you how much medicine to take. Wear a medical identification bracelet or chain if you are taking this medicine for seizures, and carry a card that describes your disease and details of your medicine and dosage times. You may get drowsy or dizzy. Do not drive, use machinery, or do anything that needs mental alertness until you know how this medicine affects you. Do not stand or sit up quickly, especially if you are an older patient. This reduces the risk of dizzy or fainting spells. Alcohol may interfere with the effect of this medicine. Avoid alcoholic drinks. If you have a heart condition, like congestive heart failure, and notice that you are retaining water and have swelling in your hands or feet, contact your health care provider immediately. The use of this medicine may increase the chance of suicidal thoughts or actions. Pay special attention to how you are responding while on this medicine. Any worsening of mood, or thoughts of suicide or dying should be reported to your health care professional right away. This medicine has caused reduced sperm counts in some men. This may interfere with the ability to father a child. You should talk to your doctor or health care professional if you are concerned about your fertility. Women who become pregnant while using this medicine for seizures may enroll in the [place] [employer] Antiepileptic Drug Pregnancy Registry by calling ([phone]. This registry collects information about the safety of antiepileptic drug use during pregnancy. What side effects may I notice from receiving this medicine? Side effects that you should report to your doctor or health care professional as soon as possible: -allergic reactions like skin rash, itching or hives, swelling of the face, lips, or tongue -breathing problems -changes in vision -chest pain -confusion -jerking or unusual movements of any part of your body -loss  of memory -muscle pain, tenderness, or weakness -suicidal thoughts or other mood changes -swelling of the ankles, feet, hands -unusual bruising or bleeding Side effects that usually do not require medical attention (Report these to your doctor or health care professional if they continue or are bothersome.): -dizziness -drowsiness -dry mouth -headache -nausea -tremors -trouble sleeping -weight gain This list may not describe all possible side effects.  Call your doctor for medical advice about side effects. You may report side effects to FDA at 1-800-FDA-1088. Where should I keep my medicine? Keep out of the reach of children. This medicine can be abused. Keep your medicine in a safe place to protect it from theft. Do not share this medicine with anyone. Selling or giving away this medicine is dangerous and against the law. This medicine may cause accidental overdose and death if it taken by other adults, children, or pets. Mix any unused medicine with a substance like cat litter or coffee grounds. Then throw the medicine away in a sealed container like a sealed bag or a coffee can with a lid. Do not use the medicine after the expiration date. Store at room temperature between 15 and 30 degrees C (59 and 86 degrees F). NOTE: This sheet is a summary. It may not cover all possible information. If you have questions about this medicine, talk to your doctor, pharmacist, or health care provider.    2016, Elsevier/[employer] Standard. (2013-02-13 15:38:53)  Acetaminophen; Butalbital; Caffeine tablets or capsules What is this medicine? ACETAMINOPHEN; BUTALBITAL; CAFFEINE (a set a MEE noe fen; byoo TAL bi tal; KAF een) is a pain reliever. It is used to treat tension headaches. This medicine may be used for other purposes; ask your health care provider or pharmacist if you have questions. What should I tell my health care provider before I take this medicine? They need to know if you have any of these  conditions: -drug abuse or addiction -heart or circulation problems -if you often drink alcohol -kidney disease or problems going to the bathroom -liver disease -lung disease, asthma, or breathing problems -porphyria -an unusual or allergic reaction to acetaminophen, butalbital or other barbiturates, caffeine, other medicines, foods, dyes, or preservatives -pregnant or trying to get pregnant -breast-feeding How should I use this medicine? Take this medicine by mouth with a full glass of water. Follow the directions on the prescription label. If the medicine upsets your stomach, take the medicine with food or milk. Do not take more than you are told to take. Talk to your pediatrician regarding the use of this medicine in children. Special care may be needed. Overdosage: If you think you have taken too much of this medicine contact a poison control center or emergency room at once. NOTE: This medicine is only for you. Do not share this medicine with others. What if I miss a dose? If you miss a dose, take it as soon as you can. If it is almost time for your next dose, take only that dose. Do not take double or extra doses. What may interact with this medicine? -alcohol or medicines that contain alcohol -antidepressants, especially MAOIs like isocarboxazid, phenelzine, tranylcypromine, and selegiline -antihistamines -benzodiazepines -carbamazepine -isoniazid -medicines for pain like pentazocine, buprenorphine, butorphanol, nalbuphine, tramadol, and propoxyphene -muscle relaxants -naltrexone -phenobarbital, phenytoin, and fosphenytoin -phenothiazines like perphenazine, thioridazine, chlorpromazine, mesoridazine, fluphenazine, prochlorperazine, promazine, and trifluoperazine -voriconazole This list may not describe all possible interactions. Give your health care provider a list of all the medicines, herbs, non-prescription drugs, or dietary supplements you use. Also tell them if you smoke,  drink alcohol, or use illegal drugs. Some items may interact with your medicine. What should I watch for while using this medicine? Tell your doctor or health care professional if your pain does not go away, if it gets worse, or if you have new or a different type of pain. You may develop tolerance to the medicine. Tolerance  means that you will need a higher dose of the medicine for pain relief. Tolerance is normal and is expected if you take the medicine for a long time. Do not suddenly stop taking your medicine because you may develop a severe reaction. Your body becomes used to the medicine. This does NOT mean you are addicted. Addiction is a behavior related to getting and using a drug for a non-medical reason. If you have pain, you have a medical reason to take pain medicine. Your doctor will tell you how much medicine to take. If your doctor wants you to stop the medicine, the dose will be slowly lowered over time to avoid any side effects. You may get drowsy or dizzy when you first start taking the medicine or change doses. Do not drive, use machinery, or do anything that may be dangerous until you know how the medicine affects you. Stand or sit up slowly. Do not take other medicines that contain acetaminophen with this medicine. Always read labels carefully. If you have questions, ask your doctor or pharmacist. If you take too much acetaminophen get medical help right away. Too much acetaminophen can be very dangerous and cause liver damage. Even if you do not have symptoms, it is important to get help right away. What side effects may I notice from receiving this medicine? Side effects that you should report to your doctor or health care professional as soon as possible: -allergic reactions like skin rash, itching or hives, swelling of the face, lips, or tongue -breathing problems -confusion -feeling faint or lightheaded, falls -redness, blistering, peeling or loosening of the skin, including  inside the mouth -seizure -stomach pain -yellowing of the eyes or skin Side effects that usually do not require medical attention (report to your doctor or health care professional if they continue or are bothersome): -constipation -nausea, vomiting This list may not describe all possible side effects. Call your doctor for medical advice about side effects. You may report side effects to FDA at 1-800-FDA-1088. Where should I keep my medicine? Keep out of the reach of children. This medicine can be abused. Keep your medicine in a safe place to protect it from theft. Do not share this medicine with anyone. Selling or giving away this medicine is dangerous and against the law. This medicine may cause accidental overdose and death if it taken by other adults, children, or pets. Mix any unused medicine with a substance like cat litter or coffee grounds. Then throw the medicine away in a sealed container like a sealed bag or a coffee can with a lid. Do not use the medicine after the expiration date. Store at room temperature between 15 and 30 degrees C (59 and 86 degrees F). NOTE: This sheet is a summary. It may not cover all possible information. If you have questions about this medicine, talk to your doctor, pharmacist, or health care provider.    2016, Elsevier/Gold Standard. (2013-02-13 15:00:25)

## 2014-10-27 NOTE — Progress Notes (Signed)
RN discussed discharge instructions with patient, patient vocalized understanding of medication regimen, new medications, follow up appointment. IV removed. Patient was given discharge instructions and all prescriptions. Patient states she is waiting for her mother to pick her up. Patient to be escorted by staff to car via wheelchair. Will continue to monitor until transportation arrives.

## 2014-11-01 ENCOUNTER — Ambulatory Visit (INDEPENDENT_AMBULATORY_CARE_PROVIDER_SITE_OTHER): Payer: No Typology Code available for payment source | Admitting: Family Medicine

## 2014-11-01 VITALS — BP 115/67 | HR 72 | Temp 98.1°F | Resp 14 | Ht 63.0 in | Wt 160.0 lb

## 2014-11-01 DIAGNOSIS — Z1239 Encounter for other screening for malignant neoplasm of breast: Secondary | ICD-10-CM

## 2014-11-01 DIAGNOSIS — F419 Anxiety disorder, unspecified: Principal | ICD-10-CM

## 2014-11-01 DIAGNOSIS — F329 Major depressive disorder, single episode, unspecified: Secondary | ICD-10-CM

## 2014-11-01 DIAGNOSIS — K219 Gastro-esophageal reflux disease without esophagitis: Secondary | ICD-10-CM

## 2014-11-01 DIAGNOSIS — F32A Depression, unspecified: Secondary | ICD-10-CM

## 2014-11-01 DIAGNOSIS — J4520 Mild intermittent asthma, uncomplicated: Secondary | ICD-10-CM

## 2014-11-01 DIAGNOSIS — F418 Other specified anxiety disorders: Secondary | ICD-10-CM

## 2014-11-01 DIAGNOSIS — G44209 Tension-type headache, unspecified, not intractable: Secondary | ICD-10-CM

## 2014-11-01 DIAGNOSIS — A6000 Herpesviral infection of urogenital system, unspecified: Secondary | ICD-10-CM

## 2014-11-01 MED ORDER — DULOXETINE HCL 60 MG PO CPEP: 60.0000 mg | ORAL_CAPSULE | Freq: Two times a day (BID) | ORAL | Status: DC

## 2014-11-01 MED ORDER — VALACYCLOVIR HCL 500 MG PO TABS: 500.0000 mg | ORAL_TABLET | Freq: Every day | ORAL | Status: DC

## 2014-11-01 MED ORDER — ALBUTEROL SULFATE HFA 108 (90 BASE) MCG/ACT IN AERS: 2.0000 | INHALATION_SPRAY | Freq: Four times a day (QID) | RESPIRATORY_TRACT | Status: DC | PRN

## 2014-11-01 MED ORDER — BUTALBITAL-APAP-CAFFEINE 50-325-40 MG PO TABS: 1.0000 | ORAL_TABLET | Freq: Four times a day (QID) | ORAL | Status: DC | PRN

## 2014-11-01 MED ORDER — FLUTICASONE-SALMETEROL 500-50 MCG/DOSE IN AEPB: INHALATION_SPRAY | RESPIRATORY_TRACT | Status: DC

## 2014-11-01 MED ORDER — ESOMEPRAZOLE MAGNESIUM 40 MG PO CPDR: 40.0000 mg | DELAYED_RELEASE_CAPSULE | Freq: Every day | ORAL | Status: DC

## 2014-11-01 NOTE — Patient Instructions (Addendum)
[facility]  [address] [id number] Headache Without Cause A headache is pain or discomfort felt around the head or neck area. The specific cause of a headache may not be found. There are many causes and types of headaches. A few common ones are:  Tension headaches.  Migraine headaches.  Cluster headaches.  Chronic daily headaches. HOME CARE INSTRUCTIONS  Watch your condition for any changes. Take these steps to help with your condition: Managing Pain  Take over-the-counter and prescription medicines only as told by your health care provider.  Lie down in a dark, quiet room when you have a headache.  If directed, apply ice to the head and neck area:  Put ice in a plastic bag.  Place a towel between your skin and the bag.  Leave the ice on for 20 minutes, 2-3 times per day.  Use a heating pad or hot shower to apply heat to the head and neck area as told by your health care provider.  Keep lights dim if bright lights bother you or make your headaches worse. Eating and Drinking  Eat meals on a regular schedule.  Limit alcohol use.  Decrease the amount of caffeine you drink, or stop drinking caffeine. General Instructions  Keep all follow-up visits as told by your health care provider. This is important.  Keep a headache journal to help find out what may trigger your headaches. For example, write down:  What you eat and drink.  How much sleep you get.  Any change to your diet or medicines.  Try massage or other relaxation techniques.  Limit stress.  Sit up straight, and do not tense your muscles.  Do not use tobacco products, including cigarettes, chewing tobacco, or e-cigarettes. If you need help quitting, ask your health care provider.  Exercise regularly as told by your health care provider.  Sleep on a regular schedule. Get 7-9 hours of sleep, or the amount recommended by your health care provider. SEEK MEDICAL CARE IF:    Your symptoms are not helped by medicine.  You have a headache that is different from the usual headache.  You have nausea or you vomit.  You have a fever. SEEK IMMEDIATE MEDICAL CARE IF:   Your headache becomes severe.  You have repeated vomiting.  You have a stiff neck.  You have a loss of vision.  You have problems with speech.  You have pain in the eye or ear.  You have muscular weakness or loss of muscle control.  You lose your balance or have trouble walking.  You feel faint or pass out.  You have confusion.   This information is not intended to replace advice given to you by your health care provider. Make sure you discuss any questions you have with your health care provider.   Document Released: 12/18/2004 Document Revised: 09/08/2014 Document Reviewed: 04/12/2014 Elsevier Interactive Patient Education 2016 [employer]. Food Choices for Gastroesophageal Reflux Disease, Adult When you have gastroesophageal reflux disease (GERD), the foods you eat and your eating habits are very important. Choosing the right foods can help ease the discomfort of GERD. WHAT GENERAL GUIDELINES DO I NEED TO FOLLOW?  Choose fruits, vegetables, whole grains, low-fat dairy products, and low-fat meat, fish, and poultry.  Limit fats such as oils, salad dressings, butter, nuts, and avocado.  Keep a food diary to identify foods that cause symptoms.  Avoid foods that cause reflux. These may be different for different people.  Eat frequent small meals  instead of three large meals each day.  Eat your meals slowly, in a relaxed setting.  Limit fried foods.  Cook foods using methods other than frying.  Avoid drinking alcohol.  Avoid drinking large amounts of liquids with your meals.  Avoid bending over or lying down until 2-3 hours after eating. WHAT FOODS ARE NOT RECOMMENDED? The following are some foods and drinks that may worsen your symptoms: Vegetables Tomatoes. Tomato  juice. Tomato and spaghetti sauce. Chili peppers. Onion and garlic. Horseradish. Fruits Oranges, grapefruit, and lemon (fruit and juice). Meats High-fat meats, fish, and poultry. This includes hot dogs, ribs, ham, sausage, salami, and bacon. Dairy Whole milk and chocolate milk. Sour cream. Cream. Butter. Ice cream. Cream cheese.  Beverages Coffee and tea, with or without caffeine. Carbonated beverages or energy drinks. Condiments Hot sauce. Barbecue sauce.  Sweets/Desserts Chocolate and cocoa. Donuts. Peppermint and spearmint. Fats and Oils High-fat foods, including JamaicaFrench fries and potato chips. Other Vinegar. Strong spices, such as black pepper, white pepper, red pepper, cayenne, curry powder, cloves, ginger, and chili powder. The items listed above may not be a complete list of foods and beverages to avoid. Contact your dietitian for more information.   This information is not intended to replace advice given to you by your health care provider. Make sure you discuss any questions you have with your health care provider.   Document Released: 12/18/2004 Document Revised: 01/08/2014 Document Reviewed: 10/22/2012 Elsevier Interactive Patient Education [employer]2016 Elsevier Inc.

## 2014-11-01 NOTE — Progress Notes (Signed)
Subjective:    Patient ID: Diana Benson, female    DOB: 21-Aug-1965, 49 y.o.   MRN: [id number]  HPI [name], a 49 year old patient that presents to establish care. She reports a history of a CVA in 2008, GERD, asthma,  depression, headache, and fibromyalgia.  [name] was recently hospitalized with right sided facial droop, blurred vision, and right side weakness. Patient did not have a CVA. She was treated with for a migraine type headache and discharged home. She states that she has been taking Fiorcet with moderate relief for headaches. She current reports a headache described as intermittent and aching. She states that current pain intensity is 4/10. She reports that she has been out of medications over the past 4 months due to insurance constraints.   Paitent reports a history of GERD controlled on Omeprazole. She maintains that she had been taking 40 mg BID. This has been associated with heartburn and midespigastric pain.  She denies abdominal bloating, chest pain, cough, early satiety, melena, nausea, need to clear throat frequently, odynophagia and shortness of breath. Symptoms have been present for several years. She denies dysphagia.  She has not lost weight. She denies melena, hematochezia, hematemesis, and coffee ground emesis. Medical therapy in the past has included proton pump inhibitors.  Patient complains of depression. She complains of anhedonia, depressed mood and weight gain. Onset was approximately several years ago and has been consistent since that time. She states that depression has been controlled on Cymbalta 60 mg BID.   She denies current suicidal and homicidal plan or intent. She also reports history of anxiety and panic attacks which are controlled on Cymbalta.   [name] has a history of asthma. She reports that she quit smoking 7 days ago. She states that she had been taking advair consistently and utilizing albuterol rescue inhaler as needed for asthma  exacerbation. She denies chest tightness, wheezing, coughing, or shortness of breath.    Past Medical History  Diagnosis Date  . COPD (chronic obstructive pulmonary disease) with chronic bronchitis (HCC)   . CVA (cerebral vascular accident) [facility]) December 2008    Status post left-sided CAE  . GERD (gastroesophageal reflux disease)   . Asthma   . ADHD (attention deficit hyperactivity disorder)   . Anxiety   . Depression   . Fibromyalgia   . Tobacco use disorder, continuous 01/09/2013  . PFO (patent foramen ovale) 01/09/2013  . Coronary artery calcification seen on CAT scan 01/09/2013   Immunization History  Administered Date(s) Administered  . Influenza Split 01/11/2011  . Influenza,inj,Quad PF,36+ Mos 09/18/2012, 10/26/2014  . Pneumococcal Polysaccharide-23 09/18/2012, 10/26/2014  . Tdap 09/18/2012   Social History   Social History  . Marital Status: Divorced    Spouse Name: N/A  . Number of Children: 2  . Years of Education: 14   Occupational History  . Inventory Teacher, English as a foreign languageControl Manager for [employer]     [employer]   Social History Main Topics  . Smoking status: Current Every Day Smoker -- 0.25 packs/day for 17 years    Types: Cigarettes    Last Attempt to Quit: 06/09/2010  . Smokeless tobacco: Never Used     Comment: 1 pack of cigarettes/week (01/09/2013)  . Alcohol Use: Yes     Comment: rare  . Drug Use: No  . Sexual Activity:    Partners: Male    Birth Control/ Protection: Surgical     Comment: total hysterectomy   Other Topics Concern  .  Not on file   Social History Narrative   Divorced mother of 2 (son and daughter) grandmother of one. She is actually the primary caregiver for her grandson (born 2013). She is a long-term smoker who quit for some time, and then started back again after her divorce. She is currently actively trying to stop, having cut from one and a half pack per day down to maybe 3-4 cigarettes a day.      Son (born 75) living with Dad in  [place]. Almost no contact, despite her efforts, for several years.  Struggling with behavior, alcohol, etc., and realizing that his father has not been entirely honest with him about the patient.      Daughter (born 28) has had significant issues with substance abuse and prostitution, currently "on the run."  Son born 12/18/2011, is living with the patient, who filed for custody of him 10/31/2012.       She is currently living with her mother, who has several cats. She is extremely allergic to cats and is not having significant respiratory problems since moving into the house.   Review of Systems  Constitutional: Negative.   HENT: Negative for postnasal drip.   Eyes: Positive for visual disturbance (blurred vision).  Respiratory: Negative.   Cardiovascular: Negative.  Negative for chest pain, palpitations and leg swelling.  Gastrointestinal: Negative.   Endocrine: Negative.  Negative for polydipsia, polyphagia and polyuria.  Genitourinary: Negative.   Musculoskeletal: Negative.   Skin: Negative.   Allergic/Immunologic: Negative.  Negative for environmental allergies and food allergies.  Neurological: Negative.   Hematological: Negative.   Psychiatric/Behavioral: Positive for sleep disturbance.       Objective:   Physical Exam  Constitutional: She is oriented to person, place, and time. She appears well-developed and well-nourished.  HENT:  Head: Normocephalic and atraumatic.  Right Ear: External ear normal.  Mouth/Throat: Oropharynx is clear and moist.  Eyes: Conjunctivae and EOM are normal. Pupils are equal, round, and reactive to light.  Neck: Normal range of motion. Neck supple.  Cardiovascular: Normal rate, regular rhythm, normal heart sounds and intact distal pulses.   Pulmonary/Chest: Effort normal and breath sounds normal.  Abdominal: Soft. Bowel sounds are normal.  Musculoskeletal: Normal range of motion.  Neurological: She is alert and oriented to person, place, and  time. She has normal reflexes.  Skin: Skin is warm and dry.  Psychiatric: She has a normal mood and affect. Her behavior is normal. Judgment and thought content normal.     BP 115/67 mmHg  Pulse 72  Temp(Src) 98.1 F (36.7 C) (Oral)  Resp 14  Ht  (1.6 m)  Wt 160 lb (72.576 kg)  BMI 28.35 kg/m2 Assessment & Plan:  1. Tension-type headache, not intractable, unspecified chronicity pattern Will continue Fiorcet. Discussed maintaining a headache diary for 1 month. Will follow-up.  - butalbital-acetaminophen-caffeine (FIORICET, ESGIC) 50-325-40 MG tablet; Take 1 tablet by mouth every 6 (six) hours as needed for headache.  Dispense: 30 tablet; Refill: 0  2. Anxiety and depression Recommended that patient follow up with [facility] for further treatment of depression and anxiety.  - DULoxetine (CYMBALTA) 60 MG capsule; Take 1 capsule (60 mg total) by mouth 2 (two) times daily.  Dispense: 60 capsule; Refill: 2  3. Gastroesophageal reflux disease without esophagitis Given written information on GERD. Will decrease current dosage of omeprazole to 40 mg daily. 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 She reports that surgery  was in 2003.  - esomeprazole (NEXIUM) 40 MG capsule; Take 1 capsule (40 mg total) by mouth daily at 12 noon.  Dispense: 30 capsule; Refill: 3  4. Asthma, mild intermittent, uncomplicated Patient reports that it has been several months since asthma exacerbation. Will continue medications as previously prescribed.  - montelukast (SINGULAIR) 10 MG tablet; Take 10 mg by mouth at bedtime. - albuterol (PROAIR HFA) 108 (90 BASE) MCG/ACT inhaler; Inhale 2 puffs into the lungs every 6 (six) hours as needed for wheezing or shortness of breath.  Dispense: 1 each; Refill: 0 - Fluticasone-Salmeterol (ADVAIR DISKUS) 500-50 MCG/DOSE AEPB; INHALE 1 PUFF INTO THE LUNGS 2 TIMES DAILY  Dispense: 1 each; Refill: 2  5. Genital  herpes Ms. Athens reports that she has not experienced an outbreak in several months. Genital herpes is controlled on daily Valtrex, will continue.  - valACYclovir (VALTREX) 500 MG tablet; Take 1 tablet (500 mg total) by mouth daily.  Dispense: 30 tablet; Refill: 3    Preventative care:  Recommend mammogram Patient is up to date on vaccinations  RTC: 1 month for headache follow up  Massie Maroon, FNP

## 2014-11-02 ENCOUNTER — Encounter: Payer: Self-pay | Admitting: Family Medicine

## 2015-02-01 ENCOUNTER — Ambulatory Visit: Payer: No Typology Code available for payment source | Admitting: Family Medicine

## 2015-02-04 ENCOUNTER — Ambulatory Visit
Admission: RE | Admit: 2015-02-04 | Discharge: 2015-02-04 | Disposition: A | Discharge status: Home / Self Care | Payer: No Typology Code available for payment source | Source: Ambulatory Visit | Attending: Family Medicine | Admitting: Family Medicine

## 2015-02-04 DIAGNOSIS — Z1239 Encounter for other screening for malignant neoplasm of breast: Secondary | ICD-10-CM

## 2015-02-08 ENCOUNTER — Other Ambulatory Visit: Payer: Self-pay | Admitting: Family Medicine

## 2015-02-08 DIAGNOSIS — R928 Other abnormal and inconclusive findings on diagnostic imaging of breast: Secondary | ICD-10-CM

## 2015-02-14 ENCOUNTER — Ambulatory Visit
Admission: RE | Admit: 2015-02-14 | Discharge: 2015-02-14 | Disposition: A | Discharge status: Home / Self Care | Payer: No Typology Code available for payment source | Source: Ambulatory Visit | Attending: Family Medicine | Admitting: Family Medicine

## 2015-02-14 DIAGNOSIS — R928 Other abnormal and inconclusive findings on diagnostic imaging of breast: Secondary | ICD-10-CM

## 2015-02-17 ENCOUNTER — Ambulatory Visit: Payer: No Typology Code available for payment source | Admitting: Family Medicine

## 2015-02-28 ENCOUNTER — Ambulatory Visit: Payer: No Typology Code available for payment source | Admitting: Family Medicine

## 2015-04-07 ENCOUNTER — Encounter: Payer: Self-pay | Admitting: Family Medicine

## 2015-04-07 ENCOUNTER — Ambulatory Visit (INDEPENDENT_AMBULATORY_CARE_PROVIDER_SITE_OTHER): Payer: No Typology Code available for payment source | Admitting: Family Medicine

## 2015-04-07 VITALS — BP 122/79 | HR 75 | Temp 98.0°F | Resp 16 | Ht 62.0 in | Wt 152.0 lb

## 2015-04-07 DIAGNOSIS — G44209 Tension-type headache, unspecified, not intractable: Secondary | ICD-10-CM

## 2015-04-07 DIAGNOSIS — K219 Gastro-esophageal reflux disease without esophagitis: Secondary | ICD-10-CM

## 2015-04-07 DIAGNOSIS — M797 Fibromyalgia: Secondary | ICD-10-CM

## 2015-04-07 DIAGNOSIS — Z0000 Encounter for general adult medical examination without abnormal findings: Secondary | ICD-10-CM

## 2015-04-07 DIAGNOSIS — A6000 Herpesviral infection of urogenital system, unspecified: Secondary | ICD-10-CM

## 2015-04-07 DIAGNOSIS — J4520 Mild intermittent asthma, uncomplicated: Secondary | ICD-10-CM

## 2015-04-07 LAB — COMPLETE METABOLIC PANEL WITH GFR
ALT: 14 U/L (ref 6–29)
AST: 16 U/L (ref 10–35)
Albumin: 4.1 g/dL (ref 3.6–5.1)
Alkaline Phosphatase: 54 U/L (ref 33–115)
BUN: 7 mg/dL (ref 7–25)
CHLORIDE: 103 mmol/L (ref 98–110)
CO2: 24 mmol/L (ref 20–31)
Calcium: 9.3 mg/dL (ref 8.6–10.2)
Creat: 0.56 mg/dL (ref 0.50–1.10)
GFR, Est African American: >89 mL/min (ref >=60)
GLUCOSE: 76 mg/dL (ref 65–99)
POTASSIUM: 4.2 mmol/L (ref 3.5–5.3)
SODIUM: 138 mmol/L (ref 135–146)
Total Bilirubin: 0.5 mg/dL (ref 0.2–1.2)
Total Protein: 7.3 g/dL (ref 6.1–8.1)

## 2015-04-07 LAB — CBC WITH DIFFERENTIAL/PLATELET
Basophils Absolute: 94 cells/uL (ref 0–200)
Basophils Relative: 2 %
EOS ABS: 141 {cells}/uL (ref 15–500)
Eosinophils Relative: 3 %
HEMATOCRIT: 37.3 % (ref 35.0–45.0)
HEMOGLOBIN: 12 g/dL (ref 11.7–15.5)
LYMPHS ABS: 2256 {cells}/uL (ref 850–3900)
Lymphocytes Relative: 48 %
MCH: 27.4 pg (ref 27.0–33.0)
MCHC: 32.2 g/dL (ref 32.0–36.0)
MCV: 85.2 fL (ref 80.0–100.0)
MONO ABS: 470 {cells}/uL (ref 200–950)
MPV: 9 fL (ref 7.5–12.5)
Monocytes Relative: 10 %
NEUTROS PCT: 37 %
Neutro Abs: 1739 cells/uL (ref 1500–7800)
Platelets: 380 10*3/uL (ref 140–400)
RBC: 4.38 MIL/uL (ref 3.80–5.10)
RDW: 15.6 % — ABNORMAL HIGH (ref 11.0–15.0)
WBC: 4.7 10*3/uL (ref 3.8–10.8)

## 2015-04-07 LAB — LIPID PANEL
Cholesterol: 158 mg/dL (ref 125–200)
HDL: 64 mg/dL (ref >=46)
LDL CALC: 78 mg/dL (ref <130)
TRIGLYCERIDES: 80 mg/dL (ref <150)
Total CHOL/HDL Ratio: 2.5 Ratio (ref <=5.0)
VLDL: 16 mg/dL (ref <30)

## 2015-04-07 MED ORDER — BUTALBITAL-APAP-CAFFEINE 50-325-40 MG PO TABS: 1.0000 | ORAL_TABLET | Freq: Four times a day (QID) | ORAL | Status: AC | PRN

## 2015-04-07 MED ORDER — FLUTICASONE-SALMETEROL 500-50 MCG/DOSE IN AEPB: INHALATION_SPRAY | RESPIRATORY_TRACT | Status: AC

## 2015-04-07 MED ORDER — EST ESTROGENS-METHYLTEST 0.625-1.25 MG PO TABS: 1.0000 | ORAL_TABLET | Freq: Every day | ORAL | Status: AC

## 2015-04-07 MED ORDER — MONTELUKAST SODIUM 10 MG PO TABS: 10.0000 mg | ORAL_TABLET | Freq: Every day | ORAL | Status: AC

## 2015-04-07 MED ORDER — VALACYCLOVIR HCL 500 MG PO TABS: 500.0000 mg | ORAL_TABLET | Freq: Every day | ORAL | Status: AC

## 2015-04-07 MED ORDER — ASPIRIN 81 MG PO TBEC: 81.0000 mg | DELAYED_RELEASE_TABLET | Freq: Every day | ORAL | Status: AC

## 2015-04-07 MED ORDER — GABAPENTIN 300 MG PO CAPS: 300.0000 mg | ORAL_CAPSULE | Freq: Three times a day (TID) | ORAL | Status: AC

## 2015-04-07 MED ORDER — ALBUTEROL SULFATE HFA 108 (90 BASE) MCG/ACT IN AERS: 2.0000 | INHALATION_SPRAY | Freq: Four times a day (QID) | RESPIRATORY_TRACT | Status: AC | PRN

## 2015-04-07 MED ORDER — ESOMEPRAZOLE MAGNESIUM 40 MG PO CPDR: 40.0000 mg | DELAYED_RELEASE_CAPSULE | Freq: Every day | ORAL | Status: AC

## 2015-04-07 NOTE — Progress Notes (Signed)
Patient ID: Diana Benson, female   DOB: [birth year], 50 y.o.   MRN: [id number]   Diana Benson, is a 50 y.o. female  FAO:[id number]  ONG:[id number]  DOB - [birth year]  CC:  Chief Complaint  Patient presents with  . Medication Refill    cymbalta, adviar, estrogen, singlair refills        HPI: Diana Benson is a 50 y.o. female here to follow-up on multiple health problems. She was seen last by Mrs. Hollis in October 2016. She had several medications prescribed at that time but did not fill prescription due to financial constraints. She would like a refill on those medications as she has arranged financial assistance. She has a history of CVA in 2008, with no significant residual deficit. She has CAD and is followed by cardiology. She has COPD and continues to smoke about a pack a week. She has been prescribed Advair, Singular and albuterol She has been prescribed Nexium for GERD, which works, currently is taking Zantac OTC. Has been treated with gabapentin for fibromyalgia. Her chart indicates she is allergic but she says this is not correct; that she stopped because it was not working. She would like to resume gabapentin. She is followed by Vesta Mixer for depression and anxiety and Mrs. Hart Rochester has prescribed Cymbalta, which reports does help. She would like to restart. Her only medication at this time is Adderall for ADHD.She has a complicated social history which can be read about in notes from October 16.  Allergies  Allergen Reactions  . Codeine Nausea And Vomiting  . Flexeril [Cyclobenzaprine] Other (See Comments)    "Makes me feel like I'm coming out of my skin."  . Gabapentin Swelling  . Morphine And Related Nausea And Vomiting  . Venlafaxine Nausea Only   Past Medical History  Diagnosis Date  . COPD (chronic obstructive pulmonary disease) with chronic bronchitis (HCC)   . CVA (cerebral vascular accident) [facility] December 2008    Status post left-sided CAE  . GERD (gastroesophageal reflux  disease)   . Asthma   . ADHD (attention deficit hyperactivity disorder)   . Anxiety   . Depression   . Fibromyalgia   . Tobacco use disorder, continuous 01/09/2013  . PFO (patent foramen ovale) 01/09/2013  . Coronary artery calcification seen on CAT scan 01/09/2013   Current Outpatient Prescriptions on File Prior to Visit  Medication Sig Dispense Refill  . amphetamine-dextroamphetamine (ADDERALL XR) 30 MG 24 hr capsule Take 30 mg by mouth 3 (three) times daily.    . DULoxetine (CYMBALTA) 60 MG capsule Take 1 capsule (60 mg total) by mouth 2 (two) times daily. 60 capsule 2  . ALPRAZolam (XANAX) 1 MG tablet Take 1 tablet (1 mg total) by mouth 3 (three) times daily as needed. (Patient not taking: Reported on 04/07/2015) 15 tablet 0  . pregabalin (LYRICA) 25 MG capsule Take 1 capsule (25 mg total) by mouth 2 (two) times daily. (Patient not taking: Reported on 04/07/2015) 60 capsule 0  . zolpidem (AMBIEN) 10 MG tablet Take 10 mg by mouth at bedtime as needed for sleep. Reported on 04/07/2015     No current facility-administered medications on file prior to visit.   Family History  Problem Relation Age of Onset  . Osteoporosis Mother   . Hypertension Mother   . Stroke Father   . Drug abuse Daughter   . Alzheimer's disease Paternal Grandmother   . Transient ischemic attack Paternal Grandmother   . Acute myelogenous leukemia Paternal Grandfather   .  Heart Problems Brother     ?murmur   Social History   Social History  . Marital Status: Divorced    Spouse Name: N/A  . Number of Children: 2  . Years of Education: 14   Occupational History  . Inventory Teacher, English as a foreign languageControl Manager for [employer]     [employer]   Social History Main Topics  . Smoking status: Current Every Day Smoker -- 0.25 packs/day for 17 years    Types: Cigarettes    Last Attempt to Quit: 06/09/2010  . Smokeless tobacco: Never Used     Comment: 1 pack of cigarettes/week (01/09/2013)  . Alcohol Use: Yes     Comment: rare  .  Drug Use: No  . Sexual Activity:    Partners: Male    Birth Control/ Protection: Surgical     Comment: total hysterectomy   Other Topics Concern  . Not on file   Social History Narrative   Divorced mother of 2 (son and daughter) grandmother of one. She is actually the primary caregiver for her grandson (born 2013). She is a long-term smoker who quit for some time, and then started back again after her divorce. She is currently actively trying to stop, having cut from one and a half pack per day down to maybe 3-4 cigarettes a day.      Son (born [id number]) living with [place] in [place]. Almost no contact, despite her efforts, for several years.  Struggling with behavior, alcohol, etc., and realizing that his father has not been entirely honest with him about the patient.      Daughter (born [id number]) has had significant issues with substance abuse and prostitution, currently "on the run."  Son born 12/18/2011, is living with the patient, who filed for custody of him 10/31/2012.       She is currently living with her mother, who has several cats. She is extremely allergic to cats and is not having significant respiratory problems since moving into the house.    Review of Systems: Constitutional: Negative for fever, chills, appetite change, weight loss,  Fatigue. Skin: Negative for rashes or lesions of concern. HENT: Negative for ear pain, ear discharge.nose bleeds Eyes: Negative for pain, discharge, redness, itching. Decreased visual acuity.  Neck: Negative for pain, stiffness Respiratory: Positive for cough, shortness of breath, and some wheezing, sometimes at rest. Cardiovascular: Negative for chest pain, palpitations and leg swelling. Gastrointestinal: Negative for abdominal pain, nausea, vomiting, diarrhea, constipations Genitourinary: Negative for dysuria, urgency, frequency, hematuria,  Musculoskeletal: Negative for back pain, joint pain, joint  swelling, and gait problem.Negative for  weakness.Positive for scattered body pains with fibromyalgia Neurological: Negative for dizziness, tremors, seizures, syncope,   light-headedness, numbness. Positive for headaches.  Hematological: Negative for easy bruising or bleeding Psychiatric/Behavioral: Positive  for depression, anxiety,   Objective:   Filed Vitals:   04/07/15 1331  BP: 122/79  Pulse: 75  Temp: 98 F (36.7 C)  Resp: 16    Physical Exam: Constitutional: Patient appears well-developed and well-nourished. No distress. HENT: Normocephalic, atraumatic, External right and left ear normal. Oropharynx is clear and moist.  Eyes: Conjunctivae and EOM are normal. PERRLA, no scleral icterus. Neck: Normal ROM. Neck supple. No lymphadenopathy, No thyromegaly. CVS: RRR, S1/S2 +, no murmurs, no gallops, no rubs Pulmonary: Effort and breath sounds normal, no stridor, rhonchi, wheezes, rales.  Abdominal: Soft. Normoactive BS,, no distension, tenderness, rebound or guarding.  Musculoskeletal: Normal range of motion. No edema and no tenderness.  Neuro: Alert.Normal muscle  tone coordination. Non-focal Skin: Skin is warm and dry. No rash noted. Not diaphoretic. No erythema. No pallor. Psychiatric: Normal mood and affect. Behavior, judgment, thought content normal.  Lab Results  Component Value Date   WBC 5.1 10/26/2014   HGB 11.0* 10/26/2014   HCT 34.0* 10/26/2014   MCV 88.5 10/26/2014   PLT 249 10/26/2014   Lab Results  Component Value Date   CREATININE 0.63 10/26/2014   BUN 8 10/26/2014   NA 136 10/26/2014   K 3.4* 10/26/2014   CL 103 10/26/2014   CO2 24 10/26/2014    Lab Results  Component Value Date   HGBA1C 5.4 10/26/2014   Lipid Panel     Component Value Date/Time   CHOL 146 10/26/2014 0446   TRIG 143 10/26/2014 0446   HDL 50 10/26/2014 0446   CHOLHDL 2.9 10/26/2014 0446   VLDL 29 10/26/2014 0446   LDLCALC 67 10/26/2014 0446       Assessment and plan:   1. Healthcare maintenance  - COMPLETE  METABOLIC PANEL WITH GFR - CBC with Differential - Lipid panel  2. Tension-type headache, not intractable, unspecified chronicity pattern  - butalbital-acetaminophen-caffeine (FIORICET, ESGIC) 50-325-40 MG tablet; Take 1 tablet by mouth every 6 (six) hours as needed for headache.  Dispense: 30 tablet; Refill: 0  3. Asthma, mild intermittent, uncomplicated  - montelukast (SINGULAIR) 10 MG tablet; Take 1 tablet (10 mg total) by mouth at bedtime.  Dispense: 30 tablet; Refill: 3 - albuterol (PROAIR HFA) 108 (90 Base) MCG/ACT inhaler; Inhale 2 puffs into the lungs every 6 (six) hours as needed for wheezing or shortness of breath.  Dispense: 1 each; Refill: 0 - Fluticasone-Salmeterol (ADVAIR DISKUS) 500-50 MCG/DOSE AEPB; INHALE 1 PUFF INTO THE LUNGS 2 TIMES DAILY  Dispense: 1 each; Refill: 2  4. Gastroesophageal reflux disease without esophagitis  - esomeprazole (NEXIUM) 40 MG capsule; Take 1 capsule (40 mg total) by mouth daily at 12 noon.  Dispense: 30 capsule; Refill: 3  5. Genital herpes  - valACYclovir (VALTREX) 500 MG tablet; Take 1 tablet (500 mg total) by mouth daily.  Dispense: 30 tablet; Refill: 3  6. Fibromyalgia  - gabapentin (NEURONTIN) 300 MG capsule; Take 1 capsule (300 mg total) by mouth 3 (three) times daily.  Dispense: 180 capsule; Refill: 3   No Follow-up on file.  The patient was given clear instructions to go to ER or return to medical center if symptoms don't improve, worsen or new problems develop. The patient verbalized understanding.    Henrietta Hoover FNP  04/07/2015, 2:19 PM

## 2015-06-13 ENCOUNTER — Encounter: Payer: Self-pay | Admitting: Family Medicine

## 2015-06-13 ENCOUNTER — Ambulatory Visit (INDEPENDENT_AMBULATORY_CARE_PROVIDER_SITE_OTHER): Payer: Self-pay | Admitting: Family Medicine

## 2015-06-13 VITALS — BP 114/75 | HR 85 | Temp 98.4°F | Resp 16 | Ht 63.0 in | Wt 158.0 lb

## 2015-06-13 DIAGNOSIS — A499 Bacterial infection, unspecified: Secondary | ICD-10-CM

## 2015-06-13 DIAGNOSIS — H109 Unspecified conjunctivitis: Secondary | ICD-10-CM

## 2015-06-13 DIAGNOSIS — H1089 Other conjunctivitis: Secondary | ICD-10-CM

## 2015-06-13 MED ORDER — AZITHROMYCIN 1 % OP SOLN: OPHTHALMIC | Status: AC

## 2015-06-13 NOTE — Progress Notes (Signed)
Subjective:    Patient ID: Diana Benson, female    DOB: [birth year], 50 y.o.   MRN: [id number]  Eye Pain  The right eye is affected. The problem occurs constantly. The pain is at a severity of 2/10. The patient is experiencing no pain. There is known exposure to pink eye. She does not wear contacts. Associated symptoms include an eye discharge. Pertinent negatives include no blurred vision, double vision, eye redness, fever, foreign body sensation, itching, nausea, photophobia, recent URI or vomiting. The treatment provided mild relief.   Past Medical History  Diagnosis Date  . COPD (chronic obstructive pulmonary disease) with chronic bronchitis (HCC)   . CVA (cerebral vascular accident) [facility]) December 2008    Status post left-sided CAE  . GERD (gastroesophageal reflux disease)   . Asthma   . ADHD (attention deficit hyperactivity disorder)   . Anxiety   . Depression   . Fibromyalgia   . Tobacco use disorder, continuous 01/09/2013  . PFO (patent foramen ovale) 01/09/2013  . Coronary artery calcification seen on CAT scan 01/09/2013   Social History   Social History  . Marital Status: Divorced    Spouse Name: N/A  . Number of Children: 2  . Years of Education: 14   Occupational History  . Inventory Teacher, English as a foreign language for [employer]     [employer]   Social History Main Topics  . Smoking status: Current Every Day Smoker -- 0.25 packs/day for 17 years    Types: Cigarettes    Last Attempt to Quit: 06/09/2010  . Smokeless tobacco: Never Used     Comment: 1 pack of cigarettes/week (01/09/2013)  . Alcohol Use: Yes     Comment: rare  . Drug Use: No  . Sexual Activity:    Partners: Male    Birth Control/ Protection: Surgical     Comment: total hysterectomy   Other Topics Concern  . Not on file   Social History Narrative   Divorced mother of 2 (son and daughter) grandmother of one. She is actually the primary caregiver for her grandson (born 2013). She is a long-term smoker who  quit for some time, and then started back again after her divorce. She is currently actively trying to stop, having cut from one and a half pack per day down to maybe 3-4 cigarettes a day.      Son (born 49) living with Dad in [place]. Almost no contact, despite her efforts, for several years.  Struggling with behavior, alcohol, etc., and realizing that his father has not been entirely honest with him about the patient.      Daughter (born 68) has had significant issues with substance abuse and prostitution, currently "on the run."  Son born 12/18/2011, is living with the patient, who filed for custody of him 10/31/2012.       She is currently living with her mother, who has several cats. She is extremely allergic to cats and is not having significant respiratory problems since moving into the house.    Review of Systems  Constitutional: Negative for fever.  HENT: Negative.   Eyes: Positive for pain, discharge and itching. Negative for blurred vision, double vision, photophobia and redness.  Respiratory: Negative.   Cardiovascular: Negative.   Gastrointestinal: Negative.  Negative for nausea and vomiting.  Endocrine: Negative.   Genitourinary: Negative.   Musculoskeletal: Negative.   Skin: Negative.  Negative for itching.  Allergic/Immunologic: Negative.   Neurological: Negative.   Hematological: Negative.   Psychiatric/Behavioral: Negative.  Objective:   Physical Exam  Constitutional: She is oriented to person, place, and time. She appears well-developed and well-nourished.  HENT:  Head: Normocephalic and atraumatic.  Right Ear: External ear normal.  Left Ear: External ear normal.  Nose: Nose normal.  Eyes: EOM are normal. Pupils are equal, round, and reactive to light. Right eye exhibits discharge. Right conjunctiva is injected. Left conjunctiva is not injected. No scleral icterus.  Neck: Normal range of motion. Neck supple.  Abdominal: Soft. Bowel sounds are normal.   Neurological: She is alert and oriented to person, place, and time. She has normal reflexes.  Skin: Skin is warm and dry.  Psychiatric: She has a normal mood and affect. Her behavior is normal. Judgment and thought content normal.     BP 114/75 mmHg  Pulse 85  Temp(Src) 98.4 F (36.9 C) (Oral)  Resp 16  Ht 5\' 3"  (1.6 m)  Wt 158 lb (71.668 kg)  BMI 28.00 kg/m2  SpO2 98% Assessment & Plan:  1. Bacterial conjunctivitis Recommend good hand-eye hygiene (Wash hands frequently) Use clean wash cloths each time face is washed Change pillow case on bed daily until resolved Apply warm compresses  Discard any eye make up, especially mascara - azithromycin (AZASITE) 1 % ophthalmic solution; One drop twice daily for two days then daily for five days  Dispense: 2.5 mL; Refill: 0  Zackariah Vanderpol M, FNP

## 2015-06-13 NOTE — Patient Instructions (Signed)

## 2015-07-07 ENCOUNTER — Ambulatory Visit: Payer: No Typology Code available for payment source | Admitting: Family Medicine

## 2015-07-14 ENCOUNTER — Emergency Department (HOSPITAL_COMMUNITY): Payer: No Typology Code available for payment source

## 2015-07-14 ENCOUNTER — Emergency Department (HOSPITAL_COMMUNITY)
Admission: EM | Admit: 2015-07-14 | Discharge: 2015-07-14 | Disposition: A | Discharge status: Home / Self Care | Payer: No Typology Code available for payment source | Attending: Emergency Medicine | Admitting: Emergency Medicine

## 2015-07-14 ENCOUNTER — Encounter (HOSPITAL_COMMUNITY): Payer: Self-pay | Admitting: Emergency Medicine

## 2015-07-14 DIAGNOSIS — J45909 Unspecified asthma, uncomplicated: Secondary | ICD-10-CM | POA: Insufficient documentation

## 2015-07-14 DIAGNOSIS — R079 Chest pain, unspecified: Secondary | ICD-10-CM | POA: Insufficient documentation

## 2015-07-14 DIAGNOSIS — Z7982 Long term (current) use of aspirin: Secondary | ICD-10-CM | POA: Insufficient documentation

## 2015-07-14 DIAGNOSIS — J449 Chronic obstructive pulmonary disease, unspecified: Secondary | ICD-10-CM | POA: Insufficient documentation

## 2015-07-14 DIAGNOSIS — F17210 Nicotine dependence, cigarettes, uncomplicated: Secondary | ICD-10-CM | POA: Insufficient documentation

## 2015-07-14 LAB — CBC
HCT: 33.1 % — ABNORMAL LOW (ref 36.0–46.0)
Hemoglobin: 10.2 g/dL — ABNORMAL LOW (ref 12.0–15.0)
MCH: 27 pg (ref 26.0–34.0)
MCHC: 30.8 g/dL (ref 30.0–36.0)
MCV: 87.6 fL (ref 78.0–100.0)
Platelets: 308 10*3/uL (ref 150–400)
RBC: 3.78 MIL/uL — ABNORMAL LOW (ref 3.87–5.11)
RDW: 14.9 % (ref 11.5–15.5)
WBC: 4.2 10*3/uL (ref 4.0–10.5)

## 2015-07-14 LAB — BASIC METABOLIC PANEL
Anion gap: 5 (ref 5–15)
BUN: 9 mg/dL (ref 6–20)
CO2: 26 mmol/L (ref 22–32)
Calcium: 8.7 mg/dL — ABNORMAL LOW (ref 8.9–10.3)
Chloride: 112 mmol/L — ABNORMAL HIGH (ref 101–111)
Creatinine, Ser: 0.53 mg/dL (ref 0.44–1.00)
GFR calc Af Amer: >60 mL/min (ref >60)
GFR calc non Af Amer: >60 mL/min (ref >60)
Glucose, Bld: 86 mg/dL (ref 65–99)
Potassium: 3.5 mmol/L (ref 3.5–5.1)
Sodium: 143 mmol/L (ref 135–145)

## 2015-07-14 LAB — I-STAT TROPONIN, ED
Troponin i, poc: 0 ng/mL (ref 0.00–0.08)
Troponin i, poc: 0.01 ng/mL (ref 0.00–0.08)

## 2015-07-14 LAB — D-DIMER, QUANTITATIVE: D-Dimer, Quant: <0.27 ug/mL-FEU (ref 0.00–0.50)

## 2015-07-14 MED ORDER — DIAZEPAM 5 MG PO TABS: 5.0000 mg | ORAL_TABLET | Freq: Two times a day (BID) | ORAL | Status: AC

## 2015-07-14 MED ORDER — KETOROLAC TROMETHAMINE 30 MG/ML IJ SOLN
30.0000 mg | Freq: Once | INTRAMUSCULAR | Status: AC
  Administered 2015-07-14: 30 mg via INTRAVENOUS
  Filled 2015-07-14: qty 1

## 2015-07-14 MED ORDER — HYDROCODONE-ACETAMINOPHEN 5-325 MG PO TABS
2.0000 | ORAL_TABLET | Freq: Once | ORAL | Status: AC
  Administered 2015-07-14: 2 via ORAL
  Filled 2015-07-14: qty 2

## 2015-07-14 MED ORDER — AZITHROMYCIN 250 MG PO TABS: 250.0000 mg | ORAL_TABLET | Freq: Every day | ORAL | Status: DC

## 2015-07-14 MED ORDER — PREDNISONE 20 MG PO TABS: 60.0000 mg | ORAL_TABLET | Freq: Every day | ORAL | Status: DC

## 2015-07-14 MED ORDER — PREDNISONE 20 MG PO TABS
60.0000 mg | ORAL_TABLET | Freq: Once | ORAL | Status: AC
  Administered 2015-07-14: 60 mg via ORAL
  Filled 2015-07-14: qty 3

## 2015-07-14 MED ORDER — NAPROXEN 500 MG PO TABS: 500.0000 mg | ORAL_TABLET | Freq: Two times a day (BID) | ORAL | Status: AC

## 2015-07-14 MED ORDER — ALBUTEROL SULFATE HFA 108 (90 BASE) MCG/ACT IN AERS: 1.0000 | INHALATION_SPRAY | Freq: Four times a day (QID) | RESPIRATORY_TRACT | Status: AC | PRN

## 2015-07-14 NOTE — Discharge Instructions (Signed)
[name],  Nice meeting you! Please follow-up with your primary care provider and cardiologist. Return to the emergency department if your pain increases, you have increased shortness of breath, new/worsening symptoms. Feel better soon!   S. Lane HackerNicole Genavieve Mangiapane, PA-C

## 2015-07-14 NOTE — ED Notes (Signed)
Pt report central CP accompanied by SOB, dizziness, lightheadedness and L arm tingling since yesterday.

## 2015-07-14 NOTE — ED Notes (Signed)
Pt reports productive cough with thick clear sputum, left constant chest pain with associated symptoms of SOB and tingling down left arm, and right side headache onset yesterday.

## 2015-07-14 NOTE — ED Provider Notes (Signed)
CSN: [id number]     Arrival date & time 07/14/15  1113 History   First MD Initiated Contact with Patient 07/14/15 1253     Chief Complaint  Patient presents with  . Chest Pain   HPI  Diana Benson is a 50 y.o. female PMH significant for COPD, CVA, GERD, asthma, fibromyalgia, PFO presenting with 2 day history of chest pain. She describes her chest pain as left-sided in location, radiating to her left arm with associated tingling, 9/10 pain scale, constant, nonexertional, pleuritic. She endorses a productive cough with thick clear sputum and associated shortness of breath (nonexertional). She denies fevers, chills, abdominal pain, nausea, vomiting, leg swelling/discolorations, recent travel.   Past Medical History  Diagnosis Date  . COPD (chronic obstructive pulmonary disease) with chronic bronchitis (HCC)   . CVA (cerebral vascular accident) [facility](HCC) December 2008    Status post left-sided CAE  . GERD (gastroesophageal reflux disease)   . Asthma   . ADHD (attention deficit hyperactivity disorder)   . Anxiety   . Depression   . Fibromyalgia   . Tobacco use disorder, continuous 01/09/2013  . PFO (patent foramen ovale) 01/09/2013  . Coronary artery calcification seen on CAT scan 01/09/2013   Past Surgical History  Procedure Laterality Date  . Appendectomy    . Abdominal hysterectomy    . Gastric bypass  05/21/2001    [place]oux[place]-Y[place]  . Tonsillectomy    . Cosmetic surgery      Post GOP  . Carotid endarterectomy Left December 2008    [facility]   Family History  Problem Relation Age of Onset  . Osteoporosis Mother   . Hypertension Mother   . Stroke Father   . Drug abuse Daughter   . Alzheimer's disease Paternal Grandmother   . Transient ischemic attack Paternal Grandmother   . Acute myelogenous leukemia Paternal Grandfather   . Heart Problems Brother     ?murmur   Social History  Substance Use Topics  . Smoking status: Current Every Day Smoker -- 0.25 packs/day  for 17 years    Types: Cigarettes    Last Attempt to Quit: 06/09/2010  . Smokeless tobacco: Never Used     Comment: 1 pack of cigarettes/week (01/09/2013)  . Alcohol Use: Yes     Comment: rare   OB History    No data available     Review of Systems  Ten systems are reviewed and are negative for acute change except as noted in the HPI  Allergies  Codeine; Flexeril; Gabapentin; Morphine and related; and Venlafaxine  Home Medications   Prior to Admission medications   Medication Sig Start Date End Date Taking? Authorizing Provider  albuterol (PROAIR HFA) 108 (90 Base) MCG/ACT inhaler Inhale 2 puffs into the lungs every 6 (six) hours as needed for wheezing or shortness of breath. 04/07/15   Henrietta HooverLinda C Bernhardt, NP  ALPRAZolam Prudy Feeler(XANAX) 1 MG tablet Take 1 tablet (1 mg total) by mouth 3 (three) times daily as needed. Patient not taking: Reported on 04/07/2015 10/26/14   Maretta BeesShanker M Ghimire, MD  amphetamine-dextroamphetamine (ADDERALL XR) 30 MG 24 hr capsule Take 30 mg by mouth 3 (three) times daily.    Historical Provider, MD  aspirin 81 MG EC tablet Take 1 tablet (81 mg total) by mouth daily. 04/07/15   Henrietta HooverLinda C Bernhardt, NP  azithromycin (AZASITE) 1 % ophthalmic solution One drop twice daily for two days then daily for five days 06/13/15   Massie MaroonLachina M Hollis, FNP  butalbital-acetaminophen-caffeine (FIORICET, ESGIC) 50-325-40 MG tablet Take 1 tablet by mouth every 6 (six) hours as needed for headache. 04/07/15   Henrietta Hoover, NP  DULoxetine (CYMBALTA) 60 MG capsule Take 1 capsule (60 mg total) by mouth 2 (two) times daily. 11/01/14   Massie Maroon, FNP  esomeprazole (NEXIUM) 40 MG capsule Take 1 capsule (40 mg total) by mouth daily at 12 noon. 04/07/15   Henrietta Hoover, NP  estrogen-methylTESTOSTERone 0.625-1.25 MG tablet Take 1 tablet by mouth daily. 04/07/15   Henrietta Hoover, NP  Fluticasone-Salmeterol (ADVAIR DISKUS) 500-50 MCG/DOSE AEPB INHALE 1 PUFF INTO THE LUNGS 2 TIMES DAILY 04/07/15   Henrietta Hoover, NP  gabapentin (NEURONTIN) 300 MG capsule Take 1 capsule (300 mg total) by mouth 3 (three) times daily. 04/07/15   Henrietta Hoover, NP  montelukast (SINGULAIR) 10 MG tablet Take 1 tablet (10 mg total) by mouth at bedtime. 04/07/15   Henrietta Hoover, NP  pregabalin (LYRICA) 25 MG capsule Take 1 capsule (25 mg total) by mouth 2 (two) times daily. Patient not taking: Reported on 04/07/2015 10/27/14   Maretta Bees, MD  valACYclovir (VALTREX) 500 MG tablet Take 1 tablet (500 mg total) by mouth daily. 04/07/15   Henrietta Hoover, NP  zolpidem (AMBIEN) 10 MG tablet Take 10 mg by mouth at bedtime as needed for sleep. Reported on 04/07/2015    Historical Provider, MD   BP 112/63 mmHg  Pulse 78  Temp(Src) 98.4 F (36.9 C) (Oral)  Resp 20  SpO2 98% Physical Exam  Constitutional: She appears well-developed and well-nourished. No distress.  HENT:  Head: Normocephalic and atraumatic.  Mouth/Throat: Oropharynx is clear and moist. No oropharyngeal exudate.  Eyes: Conjunctivae are normal. Pupils are equal, round, and reactive to light. Right eye exhibits no discharge. Left eye exhibits no discharge. No scleral icterus.  Neck: No tracheal deviation present.  Cardiovascular: Normal rate, regular rhythm, normal heart sounds and intact distal pulses.  Exam reveals no gallop and no friction rub.   No murmur heard. Pulmonary/Chest: Effort normal and breath sounds normal. No respiratory distress. She has no wheezes. She has no rales. She exhibits tenderness.  Left-sided chest tenderness  Abdominal: Soft. Bowel sounds are normal. She exhibits no distension and no mass. There is no tenderness. There is no rebound and no guarding.  Musculoskeletal: She exhibits no edema.  Lymphadenopathy:    She has no cervical adenopathy.  Neurological: She is alert. Coordination normal.  Skin: Skin is warm and dry. No rash noted. She is not diaphoretic. No erythema.  Psychiatric: She has a normal mood and affect.  Her behavior is normal.  Nursing note and vitals reviewed.   ED Course  Procedures  Labs Review Labs Reviewed  BASIC METABOLIC PANEL - Abnormal; Notable for the following:    Chloride 112 (*)    Calcium 8.7 (*)    All other components within normal limits  CBC - Abnormal; Notable for the following:    RBC 3.78 (*)    Hemoglobin 10.2 (*)    HCT 33.1 (*)    All other components within normal limits  I-STAT TROPOININ, ED    Imaging Review Dg Chest 2 View  07/14/2015  CLINICAL DATA:  Left upper chest pain and coughing for 2 days. Right-sided headache. Tobacco use. EXAM: CHEST  2 VIEW COMPARISON:  06/25/2014 FINDINGS: The heart size and mediastinal contours are within normal limits. Both lungs are clear. The visualized skeletal structures are unremarkable.  IMPRESSION: No active cardiopulmonary disease. Electronically Signed   By: Gaylyn Rong M.D.   On: 07/14/2015 11:46   I have personally reviewed and evaluated these images and lab results as part of my medical decision-making.   EKG Interpretation None      MDM   Final diagnoses:  Chest pain, unspecified chest pain type   Patient is to be discharged with recommendation to follow up with PCP in regards to today's hospital visit. Chest pain is not likely of cardiac or pulmonary etiology d/t presentation, d-dimer negative, VSS, no tracheal deviation, no JVD or new murmur, RRR, breath sounds equal bilaterally, EKG without acute abnormalities, negative troponin x 2, and negative CXR. HEART score 3. Pt has been advised to return to the ED if CP becomes exertional, associated with diaphoresis or nausea, radiates to left jaw/arm, worsens or becomes concerning in any way. Pt appears reliable for follow up and is agreeable to discharge.     Melton Krebs, PA-C 08/07/15 2055  Raeford Razor, MD 08/15/15 [phone]

## 2016-02-25 ENCOUNTER — Ambulatory Visit (INDEPENDENT_AMBULATORY_CARE_PROVIDER_SITE_OTHER): Payer: No Typology Code available for payment source | Admitting: Physician Assistant

## 2016-02-25 ENCOUNTER — Ambulatory Visit (INDEPENDENT_AMBULATORY_CARE_PROVIDER_SITE_OTHER): Payer: No Typology Code available for payment source

## 2016-02-25 VITALS — BP 118/66 | HR 99 | Temp 100.2°F | Ht 63.0 in | Wt 161.0 lb

## 2016-02-25 DIAGNOSIS — R05 Cough: Secondary | ICD-10-CM

## 2016-02-25 DIAGNOSIS — R0602 Shortness of breath: Secondary | ICD-10-CM

## 2016-02-25 DIAGNOSIS — R062 Wheezing: Secondary | ICD-10-CM

## 2016-02-25 DIAGNOSIS — J441 Chronic obstructive pulmonary disease with (acute) exacerbation: Secondary | ICD-10-CM

## 2016-02-25 DIAGNOSIS — R059 Cough, unspecified: Secondary | ICD-10-CM

## 2016-02-25 LAB — POCT CBC
GRANULOCYTE PERCENT: 69.8 % (ref 37–80)
HCT, POC: 33.9 % — AB (ref 37.7–47.9)
HEMOGLOBIN: 11.3 g/dL — AB (ref 12.2–16.2)
Lymph, poc: 1.3 (ref 0.6–3.4)
MCH: 25.9 pg — AB (ref 27–31.2)
MCHC: 33.3 g/dL (ref 31.8–35.4)
MCV: 77.9 fL — AB (ref 80–97)
MID (cbc): 0.4 (ref 0–0.9)
MPV: 6.8 fL (ref 0–99.8)
PLATELET COUNT, POC: 266 10*3/uL (ref 142–424)
POC Granulocyte: 3.8 (ref 2–6.9)
POC LYMPH PERCENT: 23.1 %L (ref 10–50)
POC MID %: 7.1 % (ref 0–12)
RBC: 4.35 M/uL (ref 4.04–5.48)
RDW, POC: 16.4 %
WBC: 5.5 10*3/uL (ref 4.6–10.2)

## 2016-02-25 LAB — POCT INFLUENZA A/B
INFLUENZA B, POC: NEGATIVE
Influenza A, POC: NEGATIVE

## 2016-02-25 MED ORDER — IPRATROPIUM BROMIDE 0.02 % IN SOLN
0.5000 mg | Freq: Once | RESPIRATORY_TRACT | Status: AC
  Administered 2016-02-25: 0.5 mg via RESPIRATORY_TRACT

## 2016-02-25 MED ORDER — PREDNISONE 20 MG PO TABS: ORAL_TABLET | ORAL | 0 refills | Status: AC

## 2016-02-25 MED ORDER — AZITHROMYCIN 250 MG PO TABS: 250.0000 mg | ORAL_TABLET | Freq: Every day | ORAL | 0 refills | Status: AC

## 2016-02-25 MED ORDER — PROMETHAZINE-DM 6.25-15 MG/5ML PO SYRP: 5.0000 mL | ORAL_SOLUTION | Freq: Four times a day (QID) | ORAL | 0 refills | Status: AC | PRN

## 2016-02-25 MED ORDER — IPRATROPIUM BROMIDE 0.02 % IN SOLN: 0.5000 mg | Freq: Four times a day (QID) | RESPIRATORY_TRACT | 1 refills | Status: AC

## 2016-02-25 NOTE — Progress Notes (Signed)
[facility] [address], [place] [id number] [phone]  Date:  02/25/2016   Name:  Diana Benson   DOB:  May 01, 1997   MRN:  [id number]  PCP:  Massie MaroonHollis,Lachina M, FNP    History of Present Illness:  Diana Amslerraci Pyle is a 51 y.o. female patient who [facility] cough, chest tightness, and sob.    3 days, she developed a dry cough, and by the end of the day she had chest pain.  Aggravated by cough.  Cough continues to be non-productive.  Stabbing sensation.  she has difficulty with breathing.  102-103.  She is alternating with ibuprofen, and acetaminophen.  Last administration was 3 hours ago with the acetaminophen 1000 mg.  She has also used nebulizer treatments at home.  Last use was this morning.  This makes her very anxious.   She had the flu vaccine well.  She is attempting to hydrate.  Some emesis secondary to coughing.    Patient Active Problem List   Diagnosis Date Noted  . Headache 10/25/2014  . Right sided weakness   . Facial droop   . TIA (transient ischemic attack) 11/24/2013  . Right arm numbness 11/24/2013  . DDD (degenerative disc disease), cervical 07/07/2013  . Coronary artery calcification seen on CAT scan 01/09/2013  . Tobacco use disorder, continuous 01/09/2013  . Cramping of hands 01/09/2013  . Occlusion and stenosis of carotid artery with cerebral infarction 01/09/2013  . PFO (patent foramen ovale) 01/09/2013  . Exertional dyspnea 01/09/2013  . Atypical chest pain 01/09/2013  . Asthma 01/19/2011  . Anxiety and depression 01/19/2011  . ADHD (attention deficit hyperactivity disorder) 01/19/2011  . Stroke, history of 01/19/2011    Class: History of  . Fibromyalgia 01/19/2011    Past Medical History:  Diagnosis Date  . ADHD (attention deficit hyperactivity disorder)   . Anxiety   . Asthma   . COPD (chronic obstructive pulmonary disease) with chronic bronchitis (HCC)   . Coronary artery calcification seen on CAT scan 01/09/2013   . CVA (cerebral vascular accident) [facility](HCC) December 2008   Status post left-sided CAE  . Depression   . Fibromyalgia   . GERD (gastroesophageal reflux disease)   . PFO (patent foramen ovale) 01/09/2013  . Tobacco use disorder, continuous 01/09/2013    Past Surgical History:  Procedure Laterality Date  . ABDOMINAL HYSTERECTOMY    . APPENDECTOMY    . [facility]  . COSMETIC SURGERY     Post GOP  . GASTRIC BYPASS  05/21/2001   Roux-en-Y[place]  . TONSILLECTOMY      Social History  Substance Use Topics  . Smoking status: Current Every Day Smoker    Packs/day: 0.25    Years: 17.00    Types: Cigarettes    Last attempt to quit: 06/09/2010  . Smokeless tobacco: Never Used     Comment: 1 pack of cigarettes/week (01/09/2013)  . Alcohol use Yes     Comment: rare    Family History  Problem Relation Age of Onset  . Osteoporosis Mother   . Hypertension Mother   . Stroke Father   . Drug abuse Daughter   . Alzheimer's disease Paternal Grandmother   . Transient ischemic attack Paternal Grandmother   . Acute myelogenous leukemia Paternal Grandfather   . Heart Problems Brother     ?murmur    Allergies  Allergen Reactions  . Codeine Nausea  And Vomiting  . Flexeril [Cyclobenzaprine] Other (See Comments)    "Makes me feel like I'm coming out of my skin."  . Gabapentin Swelling  . Morphine And Related Nausea And Vomiting  . Venlafaxine Nausea Only    Medication list has been reviewed and updated.  Current Outpatient Prescriptions on File Prior to Visit  Medication Sig Dispense Refill  . albuterol (PROAIR HFA) 108 (90 Base) MCG/ACT inhaler Inhale 2 puffs into the lungs every 6 (six) hours as needed for wheezing or shortness of breath. 1 each 0  . albuterol (PROVENTIL HFA;VENTOLIN HFA) 108 (90 Base) MCG/ACT inhaler Inhale 1-2 puffs into the lungs every 6 (six) hours as needed for wheezing or shortness of breath. 1 Inhaler 0   . ALPRAZolam (XANAX) 1 MG tablet Take 1 tablet (1 mg total) by mouth 3 (three) times daily as needed. 15 tablet 0  . amphetamine-dextroamphetamine (ADDERALL XR) 30 MG 24 hr capsule Take 30 mg by mouth 3 (three) times daily.    Marland Kitchen aspirin 81 MG EC tablet Take 1 tablet (81 mg total) by mouth daily. 30 tablet   . azithromycin (AZASITE) 1 % ophthalmic solution One drop twice daily for two days then daily for five days 2.5 mL 0  . azithromycin (ZITHROMAX) 250 MG tablet Take 1 tablet (250 mg total) by mouth daily. Take first 2 tablets together, then 1 every day until finished. 6 tablet 0  . butalbital-acetaminophen-caffeine (FIORICET, ESGIC) 50-325-40 MG tablet Take 1 tablet by mouth every 6 (six) hours as needed for headache. 30 tablet 0  . diazepam (VALIUM) 5 MG tablet Take 1 tablet (5 mg total) by mouth 2 (two) times daily. 10 tablet 0  . DULoxetine (CYMBALTA) 60 MG capsule Take 1 capsule (60 mg total) by mouth 2 (two) times daily. 60 capsule 2  . esomeprazole (NEXIUM) 40 MG capsule Take 1 capsule (40 mg total) by mouth daily at 12 noon. 30 capsule 3  . estrogen-methylTESTOSTERone 0.625-1.25 MG tablet Take 1 tablet by mouth daily. 30 tablet 3  . Fluticasone-Salmeterol (ADVAIR DISKUS) 500-50 MCG/DOSE AEPB INHALE 1 PUFF INTO THE LUNGS 2 TIMES DAILY 1 each 2  . gabapentin (NEURONTIN) 300 MG capsule Take 1 capsule (300 mg total) by mouth 3 (three) times daily. 180 capsule 3  . montelukast (SINGULAIR) 10 MG tablet Take 1 tablet (10 mg total) by mouth at bedtime. 30 tablet 3  . naproxen (NAPROSYN) 500 MG tablet Take 1 tablet (500 mg total) by mouth 2 (two) times daily. 30 tablet 0  . predniSONE (DELTASONE) 20 MG tablet Take 3 tablets (60 mg total) by mouth daily. 15 tablet 0  . pregabalin (LYRICA) 25 MG capsule Take 1 capsule (25 mg total) by mouth 2 (two) times daily. 60 capsule 0  . valACYclovir (VALTREX) 500 MG tablet Take 1 tablet (500 mg total) by mouth daily. 30 tablet 3  . zolpidem (AMBIEN) 10 MG  tablet Take 10 mg by mouth at bedtime as needed for sleep. Reported on 04/07/2015     No current facility-administered medications on file prior to visit.     ROS ROS otherwise unremarkable unless listed above.   Physical Examination: BP 118/66 (BP Location: Right Arm, Patient Position: Sitting, Cuff Size: Small)   Pulse 99   Temp 100.2 F (37.9 C) (Oral)   Ht 5\' 3"  (1.6 m)   Wt 161 lb (73 kg)   SpO2 95%   BMI 28.52 kg/m  Ideal Body Weight: Weight in (lb) to have BMI =  25: 140.8  Physical Exam  Constitutional: She is oriented to person, place, and time. She appears well-developed and well-nourished. No distress.  HENT:  Head: Normocephalic and atraumatic.  Right Ear: Tympanic membrane, external ear and ear canal normal.  Left Ear: Tympanic membrane, external ear and ear canal normal.  Nose: Mucosal edema and rhinorrhea present. Right sinus exhibits no maxillary sinus tenderness and no frontal sinus tenderness. Left sinus exhibits no maxillary sinus tenderness and no frontal sinus tenderness.  Mouth/Throat: No uvula swelling. No oropharyngeal exudate, posterior oropharyngeal edema or posterior oropharyngeal erythema.  Eyes: Conjunctivae and EOM are normal. Pupils are equal, round, and reactive to light.  Cardiovascular: Normal rate and regular rhythm.  Exam reveals no gallop, no distant heart sounds and no friction rub.   No murmur heard. Pulmonary/Chest: Effort normal. No accessory muscle usage. No apnea. No respiratory distress. She has decreased breath sounds. She has wheezes (expiratory wheezing appreciated after coughing). She has no rhonchi.  Short breaths, and fragmented sentences, though with distraction she is able to make full breaths and complete sentences.   Lymphadenopathy:       Head (right side): No submandibular, no tonsillar, no preauricular and no posterior auricular adenopathy present.       Head (left side): No submandibular, no tonsillar, no preauricular and no  posterior auricular adenopathy present.  Neurological: She is alert and oriented to person, place, and time.  Skin: She is not diaphoretic.  Psychiatric: She has a normal mood and affect. Her behavior is normal.    Results for orders placed or performed in visit on 02/25/16  POCT CBC  Result Value Ref Range   WBC 5.5 4.6 - 10.2 K/uL   Lymph, poc 1.3 0.6 - 3.4   POC LYMPH PERCENT 23.1 10 - 50 %L   MID (cbc) 0.4 0 - 0.9   POC MID % 7.1 0 - 12 %M   POC Granulocyte 3.8 2 - 6.9   Granulocyte percent 69.8 37 - 80 %G   RBC 4.35 4.04 - 5.48 M/uL   Hemoglobin 11.3 (A) 12.2 - 16.2 g/dL   HCT, POC 96.0 (A) 45.4 - 47.9 %   MCV 77.9 (A) 80 - 97 fL   MCH, POC 25.9 (A) 27 - 31.2 pg   MCHC 33.3 31.8 - 35.4 g/dL   RDW, POC 09.8 %   Platelet Count, POC 266 142 - 424 K/uL   MPV 6.8 0 - 99.8 fL  POCT Influenza A/B  Result Value Ref Range   Influenza A, POC Negative Negative   Influenza B, POC Negative Negative    Assessment and Plan: Kaylinn Dedic is a 51 y.o. female who is here today for cc of cough, chest tightness, and fever. Some improvement with ipratropium nebulizing treatment.  Given this for home as she has a nebulizer.  She will start a short prednisone taper in the morning.  We will cover for possible bacteria given hx of copd.  Wheezing - Plan: ipratropium (ATROVENT) nebulizer solution 0.5 mg  Cough - Plan: POCT CBC, DG Chest 2 View, POCT Influenza A/B, EKG 12-Lead  SOB (shortness of breath) - Plan: POCT CBC, DG Chest 2 View, POCT Influenza A/B, EKG 12-Lead  Trena Platt, PA-C [facility] 2/26/20187:25 AM

## 2016-02-25 NOTE — Patient Instructions (Addendum)
Continue to hydrate well. I would like you to start the prednisone tomorrow, as this will keep you up.    Bronchospasm, Adult A bronchospasm is when the tubes that carry air in and out of your lungs (airways) spasm or tighten. During a bronchospasm it is hard to breathe. This is because the airways get smaller. A bronchospasm can be triggered by:  Allergies. These may be to animals, pollen, food, or mold.  Infection. This is a common cause of bronchospasm.  Exercise.  Irritants. These include pollution, cigarette smoke, strong odors, aerosol sprays, and paint fumes.  Weather changes.  Stress.  Being emotional. Follow these instructions at home:  Always have a plan for getting help. Know when to call your doctor and local emergency services (911 in the [place] Know where you can get emergency care.  Only take medicines as told by your doctor.  If you were prescribed an inhaler or nebulizer machine, ask your doctor how to use it correctly. Always use a spacer with your inhaler if you were given one.  Stay calm during an attack. Try to relax and breathe more slowly.  Control your home environment:  Change your heating and air conditioning filter at least once a month.  Limit your use of fireplaces and wood stoves.  Do not  smoke. Do not  allow smoking in your home.  Avoid perfumes and fragrances.  Get rid of pests (such as roaches and mice) and their droppings.  Throw away plants if you see mold on them.  Keep your house clean and dust free.  Replace carpet with wood, tile, or vinyl flooring. Carpet can trap dander and dust.  Use allergy-proof pillows, mattress covers, and box spring covers.  Wash bed sheets and blankets every week in hot water. Dry them in a dryer.  Use blankets that are made of polyester or cotton.  Wash hands frequently. Contact a doctor if:  You have muscle aches.  You have chest pain.  The thick spit you spit or cough up (sputum) changes  from clear or white to yellow, green, gray, or bloody.  The thick spit you spit or cough up gets thicker.  There are problems that may be related to the medicine you are given such as:  A rash.  Itching.  Swelling.  Trouble breathing. Get help right away if:  You feel you cannot breathe or catch your breath.  You cannot stop coughing.  Your treatment is not helping you breathe better.  You have very bad chest pain. This information is not intended to replace advice given to you by your health care provider. Make sure you discuss any questions you have with your health care provider. Document Released: 10/15/2008 Document Revised: 05/26/2015 Document Reviewed: 06/10/2012 Elsevier Interactive Patient Education  2017 [employer]   IF you received an x-ray today, you will receive an invoice from [facility]. Please contact [facility] at [phone] with questions or concerns regarding your invoice.   IF you received labwork today, you will receive an invoice from [facility]. Please contact [facility] at [phone] with questions or concerns regarding your invoice.   Our billing staff will not be able to assist you with questions regarding bills from these companies.  You will be contacted with the lab results as soon as they are available. The fastest way to get your results is to activate your My Chart account. Instructions are located on the last page of this paperwork. If you have not heard from us  regarding the results in 2 weeks, please contact this office.

## 2016-08-31 ENCOUNTER — Emergency Department (HOSPITAL_COMMUNITY)
Admission: EM | Admit: 2016-08-31 | Discharge: 2016-08-31 | Disposition: A | Discharge status: Home / Self Care | Payer: No Typology Code available for payment source | Attending: Emergency Medicine | Admitting: Emergency Medicine

## 2016-08-31 ENCOUNTER — Encounter (HOSPITAL_COMMUNITY): Payer: Self-pay | Admitting: Emergency Medicine

## 2016-08-31 DIAGNOSIS — M545 Low back pain: Secondary | ICD-10-CM | POA: Diagnosis not present

## 2016-08-31 DIAGNOSIS — Z5321 Procedure and treatment not carried out due to patient leaving prior to being seen by health care provider: Secondary | ICD-10-CM | POA: Diagnosis not present

## 2016-08-31 DIAGNOSIS — Y939 Activity, unspecified: Secondary | ICD-10-CM | POA: Diagnosis not present

## 2016-08-31 DIAGNOSIS — Y999 Unspecified external cause status: Secondary | ICD-10-CM | POA: Diagnosis not present

## 2016-08-31 DIAGNOSIS — Y929 Unspecified place or not applicable: Secondary | ICD-10-CM | POA: Insufficient documentation
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
## 2016-08-31 NOTE — ED Notes (Signed)
Pt called x1. No answer. Not in lobby at this time.

## 2019-06-16 ENCOUNTER — Telehealth (INDEPENDENT_AMBULATORY_CARE_PROVIDER_SITE_OTHER): Payer: No Payment, Other | Admitting: Psychiatry

## 2019-06-16 ENCOUNTER — Encounter (HOSPITAL_COMMUNITY): Payer: Self-pay | Admitting: Psychiatry

## 2019-06-16 ENCOUNTER — Other Ambulatory Visit: Payer: Self-pay

## 2019-06-16 DIAGNOSIS — F3342 Major depressive disorder, recurrent, in full remission: Secondary | ICD-10-CM | POA: Insufficient documentation

## 2019-06-16 DIAGNOSIS — F900 Attention-deficit hyperactivity disorder, predominantly inattentive type: Secondary | ICD-10-CM | POA: Diagnosis not present

## 2019-06-16 MED ORDER — AMPHETAMINE-DEXTROAMPHET ER 30 MG PO CP24: ORAL_CAPSULE | ORAL | 0 refills | Status: DC

## 2019-06-16 MED ORDER — AMPHETAMINE-DEXTROAMPHETAMINE 30 MG PO TABS: 30.0000 mg | ORAL_TABLET | Freq: Two times a day (BID) | ORAL | 0 refills | Status: DC

## 2019-06-16 MED ORDER — DULOXETINE HCL 30 MG PO CPEP: 30.0000 mg | ORAL_CAPSULE | Freq: Every evening | ORAL | 2 refills | Status: AC

## 2019-06-16 MED ORDER — DULOXETINE HCL 60 MG PO CPEP: 60.0000 mg | ORAL_CAPSULE | Freq: Every day | ORAL | 2 refills | Status: DC

## 2019-06-16 NOTE — Progress Notes (Addendum)
Psychiatric Initial Adult Assessment   Virtual Visit via Video Note  I connected with Diana Benson on 06/16/19 at  9:00 AM EDT by a video enabled telemedicine application and verified that I am speaking with the correct person using two identifiers.  Location: Patient: Home Provider: Clinic   I discussed the limitations of evaluation and management by telemedicine and the availability of in person appointments. The patient expressed understanding and agreed to proceed.  I provided 24 minutes of non-face-to-face time during this encounter.     Patient Identification: Diana Benson MRN:  [id number] Date of Evaluation:  06/16/2019   Referral Source: Vesta Mixer  Chief Complaint:   " I am doing well, I need refills on my medications."  Visit Diagnosis:    ICD-10-CM   1. MDD (major depressive disorder), recurrent, in full remission (HCC)  F33.42   2. Attention deficit hyperactivity disorder (ADHD), predominantly inattentive type  F90.0     History of Present Illness: This is a 54 year old female with history of MDD and ADHD inattentive type now seen for continuity of care after being referred from [facility].  Patient reported that she has been stable on Cymbalta 60 mg in the morning and 30 mg in the evening for past few years.  She also reported being on Adderall XR 30 mg twice daily for a few years now. She takes one capsule at 5 am and the second capsule at 2 pm. She denied the extended release version impacting her sleep when she takes it late in the day. PDMP reviewed during this encounter.  Patient reported that she feels her symptoms of depression are well managed on her current dose of Cymbalta.  She denied any frequent crying spells or depressive episodes.  She stated that she can sleep well for the most part however has had difficulty with sleep due to pain in her shoulder recently.  She denied any suicidal ideations. 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  She stated that she used to work for [employer] however she was unfortunately laid off about a year ago after Covid pandemic started.  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 She is looking for a job at present.  She informed that she is the primary caregiver for her elderly mother and she is also raising her 76-year-old grandson since a very young age. She informed that she has a lot on her plate and she really needs to get a stable job. She informed that Adderall was helping her immensely with her ability to focus and complete her tasks in a timely manner.  She informed that her previous clinic she had to call in every month for refill request for Adderall.   She denied any symptom suggestive of mania, hypomania or psychosis.   Past Psychiatric History: Hx of depression and ADHD  Previous Psychotropic Medications: Yes   Substance Abuse History in the last 12 months:  No.  Consequences of Substance Abuse: NA  Past Medical History:  Past Medical History:  Diagnosis Date  . ADHD (attention deficit hyperactivity disorder)   . Anxiety   . Asthma   . COPD (chronic obstructive pulmonary disease) with chronic bronchitis (HCC)   . Coronary artery calcification seen on CAT scan 01/09/2013  . CVA (cerebral vascular accident) [facility]) December 2008   Status post left-sided CAE  . Depression   . Fibromyalgia   . GERD (gastroesophageal reflux disease)   .  PFO (patent foramen ovale) 01/09/2013  . Tobacco use disorder, continuous 01/09/2013    Past Surgical History:  Procedure Laterality Date  . ABDOMINAL HYSTERECTOMY    . APPENDECTOMY    . CAROTID ENDARTERECTOMY Left December 2008   [facility]  . COSMETIC SURGERY     Post GOP  . GASTRIC BYPASS  05/21/2001   Roux-en-Y; in [place]  . TONSILLECTOMY      Family Psychiatric History: see below  Family History:  Family History  Problem Relation Age of Onset  . Osteoporosis Mother   .  Hypertension Mother   . Stroke Father   . Drug abuse Daughter   . Alzheimer's disease Paternal Grandmother   . Transient ischemic attack Paternal Grandmother   . Acute myelogenous leukemia Paternal Grandfather   . Heart Problems Brother        ?murmur    Social History:   Social History   Socioeconomic History  . Marital status: Divorced    Spouse name: N/A  . Number of children: 2  . Years of education: 72  . Highest education level: Not on file  Occupational History  . Occupation: Charity fundraiser for [employer]    Comment: [employer]  Tobacco Use  . Smoking status: Current Every Day Smoker    Packs/day: 0.25    Years: 17.00    Pack years: 4.25    Types: Cigarettes    Last attempt to quit: 06/09/2010    Years since quitting: 9.0  . Smokeless tobacco: Never Used  . Tobacco comment: 1 pack of cigarettes/week (01/09/2013)  Substance and Sexual Activity  . Alcohol use: Yes    Comment: rare  . Drug use: No  . Sexual activity: Yes    Partners: Male    Birth control/protection: Surgical    Comment: total hysterectomy  Other Topics Concern  . Not on file  Social History Narrative   Divorced mother of 2 (son and daughter) grandmother of one. She is actually the primary caregiver for her grandson (born 2013). She is a long-term smoker who quit for some time, and then started back again after her divorce. She is currently actively trying to stop, having cut from one and a half pack per day down to maybe 3-4 cigarettes a day.      Son (born 67) living with Dad in [place]. Almost no contact, despite her efforts, for several years.  Struggling with behavior, alcohol, etc., and realizing that his father has not been entirely honest with him about the patient.      Daughter (born 60) has had significant issues with substance abuse and prostitution, currently "on the run."  Son born 12/18/2011, is living with the patient, who filed for custody of him 10/31/2012.        She is currently living with her mother, who has several cats. She is extremely allergic to cats and is not having significant respiratory problems since moving into the house.   Social Determinants of Health   Financial Resource Strain:   . Difficulty of Paying Living Expenses:   Food Insecurity:   . Worried About Programme researcher, broadcasting/film/video in the Last Year:   . Barista in the Last Year:   Transportation Needs:   . Freight forwarder (Medical):   Marland Kitchen Lack of Transportation (Non-Medical):   Physical Activity:   . Days of Exercise per Week:   . Minutes of Exercise per Session:   Stress:   .  Feeling of Stress :   Social Connections:   . Frequency of Communication with Friends and Family:   . Frequency of Social Gatherings with Friends and Family:   . Attends Religious Services:   . Active Member of Clubs or Organizations:   . Attends Archivist Meetings:   Marland Kitchen Marital Status:     Additional Social History: Lives with elderly mother and 28-year-old grandson.  Currently unemployed, looking for a job.  Allergies:   Allergies  Allergen Reactions  . Codeine Nausea And Vomiting  . Flexeril [Cyclobenzaprine] Other (See Comments)    "Makes me feel like I'm coming out of my skin."  . Gabapentin Swelling  . Morphine And Related Nausea And Vomiting  . Venlafaxine Nausea Only    Metabolic Disorder Labs: Lab Results  Component Value Date   HGBA1C 5.4 10/26/2014   MPG 108 10/26/2014   MPG 108 11/25/2013   No results found for: PROLACTIN Lab Results  Component Value Date   CHOL 158 04/07/2015   TRIG 80 04/07/2015   HDL 64 04/07/2015   CHOLHDL 2.5 04/07/2015   VLDL 16 04/07/2015   LDLCALC 78 04/07/2015   LDLCALC 67 10/26/2014   No results found for: TSH  Therapeutic Level Labs: No results found for: LITHIUM No results found for: CBMZ No results found for: VALPROATE  Current Medications: Current Outpatient Medications  Medication Sig Dispense Refill  .  albuterol (PROAIR HFA) 108 (90 Base) MCG/ACT inhaler Inhale 2 puffs into the lungs every 6 (six) hours as needed for wheezing or shortness of breath. 1 each 0  . albuterol (PROVENTIL HFA;VENTOLIN HFA) 108 (90 Base) MCG/ACT inhaler Inhale 1-2 puffs into the lungs every 6 (six) hours as needed for wheezing or shortness of breath. 1 Inhaler 0  . ALPRAZolam (XANAX) 1 MG tablet Take 1 tablet (1 mg total) by mouth 3 (three) times daily as needed. 15 tablet 0  . amphetamine-dextroamphetamine (ADDERALL XR) 30 MG 24 hr capsule Take 30 mg by mouth 3 (three) times daily.    Marland Kitchen aspirin 81 MG EC tablet Take 1 tablet (81 mg total) by mouth daily. 30 tablet   . azithromycin (AZASITE) 1 % ophthalmic solution One drop twice daily for two days then daily for five days 2.5 mL 0  . azithromycin (ZITHROMAX) 250 MG tablet Take 1 tablet (250 mg total) by mouth daily. Take first 2 tablets together, then 1 every day until finished. 6 tablet 0  . butalbital-acetaminophen-caffeine (FIORICET, ESGIC) 50-325-40 MG tablet Take 1 tablet by mouth every 6 (six) hours as needed for headache. 30 tablet 0  . diazepam (VALIUM) 5 MG tablet Take 1 tablet (5 mg total) by mouth 2 (two) times daily. 10 tablet 0  . DULoxetine (CYMBALTA) 60 MG capsule Take 1 capsule (60 mg total) by mouth 2 (two) times daily. 60 capsule 2  . esomeprazole (NEXIUM) 40 MG capsule Take 1 capsule (40 mg total) by mouth daily at 12 noon. 30 capsule 3  . estrogen-methylTESTOSTERone 0.625-1.25 MG tablet Take 1 tablet by mouth daily. 30 tablet 3  . Fluticasone-Salmeterol (ADVAIR DISKUS) 500-50 MCG/DOSE AEPB INHALE 1 PUFF INTO THE LUNGS 2 TIMES DAILY 1 each 2  . gabapentin (NEURONTIN) 300 MG capsule Take 1 capsule (300 mg total) by mouth 3 (three) times daily. 180 capsule 3  . ipratropium (ATROVENT) 0.02 % nebulizer solution Take 2.5 mLs (0.5 mg total) by nebulization 4 (four) times daily. 75 mL 1  . montelukast (SINGULAIR) 10 MG tablet Take  1 tablet (10 mg total) by mouth  at bedtime. 30 tablet 3  . naproxen (NAPROSYN) 500 MG tablet Take 1 tablet (500 mg total) by mouth 2 (two) times daily. 30 tablet 0  . predniSONE (DELTASONE) 20 MG tablet Take 3 PO QAM x3days, 2 PO QAM x3days, 1 PO QAM x3days 18 tablet 0  . pregabalin (LYRICA) 25 MG capsule Take 1 capsule (25 mg total) by mouth 2 (two) times daily. 60 capsule 0  . promethazine-dextromethorphan (PROMETHAZINE-DM) 6.25-15 MG/5ML syrup Take 5 mLs by mouth 4 (four) times daily as needed for cough. 118 mL 0  . valACYclovir (VALTREX) 500 MG tablet Take 1 tablet (500 mg total) by mouth daily. 30 tablet 3  . zolpidem (AMBIEN) 10 MG tablet Take 10 mg by mouth at bedtime as needed for sleep. Reported on 04/07/2015     No current facility-administered medications for this visit.     Psychiatric Specialty Exam: Review of Systems  There were no vitals taken for this visit.There is no height or weight on file to calculate BMI.  General Appearance: Fairly Groomed  Eye Contact:  Good  Speech:  Clear and Coherent and Normal Rate  Volume:  Normal  Mood:  Euthymic  Affect:  Congruent  Thought Process:  Goal Directed and Descriptions of Associations: Intact  Orientation:  Full (Time, Place, and Person)  Thought Content:  Logical  Suicidal Thoughts:  No  Homicidal Thoughts:  No  Memory:  Immediate;   Good Recent;   Good  Judgement:  Good  Insight:  Good  Psychomotor Activity:  Normal  Concentration:  Concentration: Good and Attention Span: Good  Recall:  Good  Fund of Knowledge:Good  Language: Good  Akathisia:  Negative  Handed:  Right  AIMS (if indicated):  Not done  Assets:  Communication Skills Desire for Improvement Housing Social Support  ADL's:  Intact  Cognition: WNL  Sleep:  Fair   Screenings: PHQ2-9     Office Visit from 02/25/2016 in [facility] at [facility] Visit from 06/13/2015 in [facility] Office Visit from 04/07/2015 in [facility] Office Visit from  11/01/2014 in [facility]  PHQ-2 Total Score 0 0 0 1      Assessment and Plan: Patient appears to be stable on her current regimen.  Would continue the same combination for now.  1. MDD (major depressive disorder), recurrent, in full remission (HCC)  - DULoxetine (CYMBALTA) 60 MG capsule; Take 1 capsule (60 mg total) by mouth daily.  Dispense: 30 capsule; Refill: 2 - DULoxetine (CYMBALTA) 30 MG capsule; Take 1 capsule (30 mg total) by mouth every evening.  Dispense: 30 capsule; Refill: 2  2. Attention deficit hyperactivity disorder (ADHD), predominantly inattentive type  - amphetamine-dextroamphetamine (ADDERALL XR) 30 MG 24 hr capsule; Take one capsule twice daily  Dispense: 60 capsule; Refill: 0 - amphetamine-dextroamphetamine (ADDERALL XR) 30 MG 24 hr capsule; Take one capsule twice daily  Dispense: 60 capsule; Refill: 0 - amphetamine-dextroamphetamine (ADDERALL XR) 30 MG 24 hr capsule; Take one capsule twice daily  Dispense: 60 capsule; Refill: 0  Continue same medication regimen. Follow up in 3 months.    Zena Amos, MD 6/15/20219:25 AM

## 2019-06-16 NOTE — Addendum Note (Signed)
Addended by: Zena Amos on: 06/16/2019 02:06 PM   Modules accepted: Orders

## 2019-09-09 ENCOUNTER — Other Ambulatory Visit (HOSPITAL_COMMUNITY): Payer: Self-pay | Admitting: Psychiatry

## 2019-09-09 ENCOUNTER — Telehealth (HOSPITAL_COMMUNITY): Payer: Self-pay | Admitting: *Deleted

## 2019-09-09 DIAGNOSIS — F900 Attention-deficit hyperactivity disorder, predominantly inattentive type: Secondary | ICD-10-CM

## 2019-09-09 MED ORDER — AMPHETAMINE-DEXTROAMPHET ER 30 MG PO CP24: ORAL_CAPSULE | ORAL | 0 refills | Status: DC

## 2019-09-09 NOTE — Telephone Encounter (Signed)
Medication reordered and sent to preferred pharmacy.

## 2019-09-09 NOTE — Telephone Encounter (Signed)
VM left for writer stating she would be out of her Adderall XR on Sunday, the 12th. She has an appt with Dr Evelene Croon on 10/21. Dr Evelene Croon is out of the office this week. Will bring need to the attention of the NP for refill. She uses the [facility] in [place] on [address] [phone].

## 2019-09-10 ENCOUNTER — Telehealth (HOSPITAL_COMMUNITY): Payer: Self-pay | Admitting: Psychiatry

## 2019-09-14 ENCOUNTER — Telehealth (HOSPITAL_COMMUNITY): Payer: Self-pay | Admitting: Psychiatry

## 2019-10-12 ENCOUNTER — Telehealth (HOSPITAL_COMMUNITY): Payer: Self-pay | Admitting: *Deleted

## 2019-10-12 DIAGNOSIS — F900 Attention-deficit hyperactivity disorder, predominantly inattentive type: Secondary | ICD-10-CM

## 2019-10-12 MED ORDER — AMPHETAMINE-DEXTROAMPHET ER 30 MG PO CP24: ORAL_CAPSULE | ORAL | 0 refills | Status: AC

## 2019-10-12 NOTE — Telephone Encounter (Signed)
30 day prescription sent to pharmacy.  

## 2019-10-12 NOTE — Addendum Note (Signed)
Addended by: Zena Amos on: 10/12/2019 11:19 AM   Modules accepted: Orders

## 2019-10-12 NOTE — Telephone Encounter (Signed)
VM left for writer needing Rx for Adderall called into her pharmacy. She should be out and has a return appt here on 10/22/19 with Dr Evelene Croon. Will bring her concern to the Dr.s attention.

## 2019-10-22 ENCOUNTER — Other Ambulatory Visit: Payer: Self-pay

## 2019-10-22 ENCOUNTER — Telehealth (INDEPENDENT_AMBULATORY_CARE_PROVIDER_SITE_OTHER): Payer: No Payment, Other | Admitting: Psychiatry

## 2019-10-22 ENCOUNTER — Encounter (HOSPITAL_COMMUNITY): Payer: Self-pay | Admitting: Psychiatry

## 2019-10-22 DIAGNOSIS — F3342 Major depressive disorder, recurrent, in full remission: Secondary | ICD-10-CM

## 2019-10-22 DIAGNOSIS — F900 Attention-deficit hyperactivity disorder, predominantly inattentive type: Secondary | ICD-10-CM

## 2019-10-22 MED ORDER — GABAPENTIN 300 MG PO CAPS: 300.0000 mg | ORAL_CAPSULE | Freq: Three times a day (TID) | ORAL | 1 refills | Status: AC

## 2019-10-22 MED ORDER — AMPHETAMINE-DEXTROAMPHET ER 30 MG PO CP24: ORAL_CAPSULE | ORAL | 0 refills | Status: AC

## 2019-10-22 MED ORDER — DULOXETINE HCL 60 MG PO CPEP: 60.0000 mg | ORAL_CAPSULE | Freq: Two times a day (BID) | ORAL | 1 refills | Status: AC

## 2019-10-22 NOTE — Progress Notes (Signed)
BH MD/PA/NP OP Progress Note  Virtual Visit via Telephone Note  I connected with Diana Benson on 10/22/19 at  4:00 PM EDT by telephone and verified that I am speaking with the correct person using two identifiers.  Location: Patient: home Provider: Clinic   I discussed the limitations, risks, security and privacy concerns of performing an evaluation and management service by telephone and the availability of in person appointments. I also discussed with the patient that there may be a patient responsible charge related to this service. The patient expressed understanding and agreed to proceed.   I provided 17 minutes of non-face-to-face time during this encounter.    10/22/2019 3:38 PM Diana Benson  MRN:  [id number]  Chief Complaint:  " I am doing good."  HPI: Pt initially reported that she is doing well, however, as the session progressed she reported she has been having a lot of anxiety. She informed that she is still taking care of her grandson and elderly mother. She has gone back to work full time. She mentioned her grandson's meds for ADHD have been adjusted and he has been referred for therapy.  She spoke about how he is dealing with a lot of things including his parents not being in his life. She then went on to say that she stopped taking the Cymbalta about a month ago and has been doing a lot of anxiety and depression.  She stated that she was feeling depressed even when she was taking the Cymbalta and that is why she stopped taking it.  She stated that she has tried a lot of different antidepressants in the past and all of them caused her to have side effects and weight gain and she does not know if she wants to go back to them. Past meds tried-Effexor, Celexa, Lexapro, Paxil, Wellbutrin  After some back-and-forth she stated that she wants to restart Cymbalta and is willing to try a higher dose for optimal effect as she has taken 120 mg in the past. She also requested if she could  be prescribed gabapentin as that helps her with her fibromyalgia.   Visit Diagnosis:    ICD-10-CM   1. MDD (major depressive disorder), recurrent, in full remission (HCC)  F33.42   2. Attention deficit hyperactivity disorder (ADHD), predominantly inattentive type  F90.0     Past Psychiatric History:   Past Medical History:  Past Medical History:  Diagnosis Date  . ADHD (attention deficit hyperactivity disorder)   . Anxiety   . Asthma   . COPD (chronic obstructive pulmonary disease) with chronic bronchitis (HCC)   . Coronary artery calcification seen on CAT scan 01/09/2013  . CVA (cerebral vascular accident) [facility]) December 2008   Status post left-sided CAE  . Depression   . Fibromyalgia   . GERD (gastroesophageal reflux disease)   . PFO (patent foramen ovale) 01/09/2013  . Tobacco use disorder, continuous 01/09/2013    Past Surgical History:  Procedure Laterality Date  . ABDOMINAL HYSTERECTOMY    . APPENDECTOMY    . CAROTID ENDARTERECTOMY Left December 2008   [employer]  . COSMETIC SURGERY     Post GOP  . GASTRIC BYPASS  05/21/2001   Roux-en-Y; in [place]  . TONSILLECTOMY      Family Psychiatric History: see below  Family History:  Family History  Problem Relation Age of Onset  . Osteoporosis Mother   . Hypertension Mother   . Stroke Father   . Drug abuse Daughter   .  Alzheimer's disease Paternal Grandmother   . Transient ischemic attack Paternal Grandmother   . Acute myelogenous leukemia Paternal Grandfather   . Heart Problems Brother        ?murmur    Social History:  Social History   Socioeconomic History  . Marital status: Divorced    Spouse name: N/A  . Number of children: 2  . Years of education: 2714  . Highest education level: Not on file  Occupational History  . Occupation: Charity fundraisernventory Control Manager for [employer]    Comment: [employer]  Tobacco Use  . Smoking status: Current Every Day Smoker    Packs/day: 0.25    Years: 17.00     Pack years: 4.25    Types: Cigarettes    Last attempt to quit: 06/09/2010    Years since quitting: 9.3  . Smokeless tobacco: Never Used  . Tobacco comment: 1 pack of cigarettes/week (01/09/2013)  Substance and Sexual Activity  . Alcohol use: Yes    Comment: rare  . Drug use: No  . Sexual activity: Yes    Partners: Male    Birth control/protection: Surgical    Comment: total hysterectomy  Other Topics Concern  . Not on file  Social History Narrative   Divorced mother of 2 (son and daughter) grandmother of one. She is actually the primary caregiver for her grandson (born 2013). She is a long-term smoker who quit for some time, and then started back again after her divorce. She is currently actively trying to stop, having cut from one and a half pack per day down to maybe 3-4 cigarettes a day.      Son (born [id number]) living with [place] in [place]. Almost no contact, despite her efforts, for several years.  Struggling with behavior, alcohol, etc., and realizing that his father has not been entirely honest with him about the patient.      Daughter (born [id number]) has had significant issues with substance abuse and prostitution, currently "on the run."  Son born 12/18/2011, is living with the patient, who filed for custody of him 10/31/2012.       She is currently living with her mother, who has several cats. She is extremely allergic to cats and is not having significant respiratory problems since moving into the house.   Social Determinants of Health   Financial Resource Strain:   . Difficulty of Paying Living Expenses: Not on file  Food Insecurity:   . Worried About Programme researcher, broadcasting/film/videounning Out of Food in the Last Year: Not on file  . Ran Out of Food in the Last Year: Not on file  Transportation Needs:   . Lack of Transportation (Medical): Not on file  . Lack of Transportation (Non-Medical): Not on file  Physical Activity:   . Days of Exercise per Week: Not on file  . Minutes of Exercise per Session: Not on  file  Stress:   . Feeling of Stress : Not on file  Social Connections:   . Frequency of Communication with Friends and Family: Not on file  . Frequency of Social Gatherings with Friends and Family: Not on file  . Attends Religious Services: Not on file  . Active Member of Clubs or Organizations: Not on file  . Attends BankerClub or Organization Meetings: Not on file  . Marital Status: Not on file    Allergies:  Allergies  Allergen Reactions  . Codeine Nausea And Vomiting  . Flexeril [Cyclobenzaprine] Other (See Comments)    "Makes me feel  like I'm coming out of my skin."  . Gabapentin Swelling  . Morphine And Related Nausea And Vomiting  . Venlafaxine Nausea Only    Metabolic Disorder Labs: Lab Results  Component Value Date   HGBA1C 5.4 10/26/2014   MPG 108 10/26/2014   MPG 108 11/25/2013   No results found for: PROLACTIN Lab Results  Component Value Date   CHOL 158 04/07/2015   TRIG 80 04/07/2015   HDL 64 04/07/2015   CHOLHDL 2.5 04/07/2015   VLDL 16 04/07/2015   LDLCALC 78 04/07/2015   LDLCALC 67 10/26/2014   No results found for: TSH  Therapeutic Level Labs: No results found for: LITHIUM No results found for: VALPROATE No components found for:  CBMZ  Current Medications: Current Outpatient Medications  Medication Sig Dispense Refill  . albuterol (PROAIR HFA) 108 (90 Base) MCG/ACT inhaler Inhale 2 puffs into the lungs every 6 (six) hours as needed for wheezing or shortness of breath. 1 each 0  . albuterol (PROVENTIL HFA;VENTOLIN HFA) 108 (90 Base) MCG/ACT inhaler Inhale 1-2 puffs into the lungs every 6 (six) hours as needed for wheezing or shortness of breath. 1 Inhaler 0  . amphetamine-dextroamphetamine (ADDERALL XR) 30 MG 24 hr capsule Take one capsule twice daily 60 capsule 0  . amphetamine-dextroamphetamine (ADDERALL XR) 30 MG 24 hr capsule Take one capsule twice daily 60 capsule 0  . amphetamine-dextroamphetamine (ADDERALL XR) 30 MG 24 hr capsule Take one  capsule twice daily 60 capsule 0  . aspirin 81 MG EC tablet Take 1 tablet (81 mg total) by mouth daily. 30 tablet   . azithromycin (AZASITE) 1 % ophthalmic solution One drop twice daily for two days then daily for five days 2.5 mL 0  . azithromycin (ZITHROMAX) 250 MG tablet Take 1 tablet (250 mg total) by mouth daily. Take first 2 tablets together, then 1 every day until finished. 6 tablet 0  . butalbital-acetaminophen-caffeine (FIORICET, ESGIC) 50-325-40 MG tablet Take 1 tablet by mouth every 6 (six) hours as needed for headache. 30 tablet 0  . diazepam (VALIUM) 5 MG tablet Take 1 tablet (5 mg total) by mouth 2 (two) times daily. 10 tablet 0  . DULoxetine (CYMBALTA) 30 MG capsule Take 1 capsule (30 mg total) by mouth every evening. 30 capsule 2  . DULoxetine (CYMBALTA) 60 MG capsule Take 1 capsule (60 mg total) by mouth daily. 30 capsule 2  . esomeprazole (NEXIUM) 40 MG capsule Take 1 capsule (40 mg total) by mouth daily at 12 noon. 30 capsule 3  . estrogen-methylTESTOSTERone 0.625-1.25 MG tablet Take 1 tablet by mouth daily. 30 tablet 3  . Fluticasone-Salmeterol (ADVAIR DISKUS) 500-50 MCG/DOSE AEPB INHALE 1 PUFF INTO THE LUNGS 2 TIMES DAILY 1 each 2  . gabapentin (NEURONTIN) 300 MG capsule Take 1 capsule (300 mg total) by mouth 3 (three) times daily. 180 capsule 3  . ipratropium (ATROVENT) 0.02 % nebulizer solution Take 2.5 mLs (0.5 mg total) by nebulization 4 (four) times daily. 75 mL 1  . montelukast (SINGULAIR) 10 MG tablet Take 1 tablet (10 mg total) by mouth at bedtime. 30 tablet 3  . naproxen (NAPROSYN) 500 MG tablet Take 1 tablet (500 mg total) by mouth 2 (two) times daily. 30 tablet 0  . predniSONE (DELTASONE) 20 MG tablet Take 3 PO QAM x3days, 2 PO QAM x3days, 1 PO QAM x3days 18 tablet 0  . pregabalin (LYRICA) 25 MG capsule Take 1 capsule (25 mg total) by mouth 2 (two) times daily. 60 capsule  0  . promethazine-dextromethorphan (PROMETHAZINE-DM) 6.25-15 MG/5ML syrup Take 5 mLs by mouth 4  (four) times daily as needed for cough. 118 mL 0  . valACYclovir (VALTREX) 500 MG tablet Take 1 tablet (500 mg total) by mouth daily. 30 tablet 3  . zolpidem (AMBIEN) 10 MG tablet Take 10 mg by mouth at bedtime as needed for sleep. Reported on 04/07/2015     No current facility-administered medications for this visit.     Psychiatric Specialty Exam: Review of Systems  There were no vitals taken for this visit.There is no height or weight on file to calculate BMI.  General Appearance: unable to assess due to phone visit  Eye Contact:  unable to assess due to phone visit  Speech:  Clear and Coherent and Normal Rate  Volume:  Normal  Mood:  Anxious and Depressed  Affect:  Congruent  Thought Process:  Goal Directed and Descriptions of Associations: Intact  Orientation:  Full (Time, Place, and Person)  Thought Content: Logical   Suicidal Thoughts:  No  Homicidal Thoughts:  No  Memory:  Immediate;   Good Recent;   Good  Judgement:  Fair  Insight:  Fair  Psychomotor Activity:  Normal  Concentration:  Concentration: Good and Attention Span: Good  Recall:  Good  Fund of Knowledge: Good  Language: Good  Akathisia:  Negative  Handed:  Right  AIMS (if indicated): not done  Assets:  Communication Skills Desire for Improvement Financial Resources/Insurance Housing  ADL's:  Intact  Cognition: WNL  Sleep:  Fair   Screenings: PHQ2-9     Office Visit from 02/25/2016 in [facility] Office Visit from 06/13/2015 in [facility] Office Visit from 04/07/2015 in [facility] Office Visit from 11/01/2014 in [facility]  PHQ-2 Total Score 0 0 0 1       Assessment and Plan: Patient had stopped taking Cymbalta for a month but is now willing to be started.  Writer suggested that she startz with 60 mg dose once a day and then goes up to 60 mg twice a day after a week.  Gabapentin 300 mg 3 times daily was started as per patient request  as it has been helpful for her in the past. Potential side effects of medication and risks vs benefits of treatment vs non-treatment were explained and discussed. All questions were answered.   1. MDD (major depressive disorder), recurrent, in full remission (HCC)  - Adjust DULoxetine (CYMBALTA) 60 MG capsule; Take 1 capsule (60 mg total) by mouth 2 (two) times daily.  Dispense: 60 capsule; Refill: 1 - Start gabapentin (NEURONTIN) 300 MG capsule; Take 1 capsule (300 mg total) by mouth 3 (three) times daily.  Dispense: 90 capsule; Refill: 1  2. Attention deficit hyperactivity disorder (ADHD), predominantly inattentive type  - amphetamine-dextroamphetamine (ADDERALL XR) 30 MG 24 hr capsule; Take one capsule twice daily  Dispense: 60 capsule; Refill: 0 - amphetamine-dextroamphetamine (ADDERALL XR) 30 MG 24 hr capsule; Take one capsule twice daily  Dispense: 60 capsule; Refill: 0    Zena Amos, MD 10/22/2019, 3:38 PM

## 2019-12-15 ENCOUNTER — Other Ambulatory Visit: Payer: Self-pay

## 2019-12-15 ENCOUNTER — Telehealth (HOSPITAL_COMMUNITY): Payer: No Payment, Other | Admitting: Psychiatry

## 2020-01-28 ENCOUNTER — Ambulatory Visit: Payer: No Typology Code available for payment source | Admitting: Gastroenterology

## 2020-03-10 ENCOUNTER — Other Ambulatory Visit (HOSPITAL_COMMUNITY): Payer: Self-pay | Admitting: Psychiatry

## 2020-03-10 DIAGNOSIS — F3342 Major depressive disorder, recurrent, in full remission: Secondary | ICD-10-CM

## 2024-01-08 ENCOUNTER — Other Ambulatory Visit: Payer: Self-pay | Admitting: Physician Assistant

## 2024-01-08 DIAGNOSIS — Z1231 Encounter for screening mammogram for malignant neoplasm of breast: Secondary | ICD-10-CM

## 2024-01-09 ENCOUNTER — Ambulatory Visit
